# Patient Record
Sex: Female | Born: 1940 | Race: White | Hispanic: No | Smoking: Never smoker
Health system: Southern US, Academic
[De-identification: ages and names within clinical notes are randomized; demographics above are authoritative.]

## PROBLEM LIST (undated history)

## (undated) ENCOUNTER — Ambulatory Visit (HOSPITAL_COMMUNITY): Admission: RE | Payer: Self-pay | Admitting: Otolaryngology

## (undated) ENCOUNTER — Encounter (HOSPITAL_COMMUNITY): Admission: RE | Payer: Self-pay

## (undated) DIAGNOSIS — C50919 Malignant neoplasm of unspecified site of unspecified female breast: Secondary | ICD-10-CM

## (undated) DIAGNOSIS — N200 Calculus of kidney: Secondary | ICD-10-CM

## (undated) DIAGNOSIS — F329 Major depressive disorder, single episode, unspecified: Secondary | ICD-10-CM

## (undated) DIAGNOSIS — R7303 Prediabetes: Secondary | ICD-10-CM

## (undated) DIAGNOSIS — H68103 Unspecified obstruction of Eustachian tube, bilateral: Secondary | ICD-10-CM

## (undated) DIAGNOSIS — K219 Gastro-esophageal reflux disease without esophagitis: Secondary | ICD-10-CM

## (undated) DIAGNOSIS — N3010 Interstitial cystitis (chronic) without hematuria: Secondary | ICD-10-CM

## (undated) DIAGNOSIS — R002 Palpitations: Secondary | ICD-10-CM

## (undated) DIAGNOSIS — R0600 Dyspnea, unspecified: Secondary | ICD-10-CM

## (undated) DIAGNOSIS — M797 Fibromyalgia: Secondary | ICD-10-CM

## (undated) DIAGNOSIS — E785 Hyperlipidemia, unspecified: Secondary | ICD-10-CM

## (undated) DIAGNOSIS — A419 Sepsis, unspecified organism: Secondary | ICD-10-CM

## (undated) DIAGNOSIS — I10 Essential (primary) hypertension: Secondary | ICD-10-CM

## (undated) DIAGNOSIS — K589 Irritable bowel syndrome without diarrhea: Secondary | ICD-10-CM

## (undated) DIAGNOSIS — R079 Chest pain, unspecified: Secondary | ICD-10-CM

## (undated) DIAGNOSIS — R0609 Other forms of dyspnea: Secondary | ICD-10-CM

## (undated) DIAGNOSIS — F419 Anxiety disorder, unspecified: Secondary | ICD-10-CM

## (undated) DIAGNOSIS — Z87442 Personal history of urinary calculi: Secondary | ICD-10-CM

## (undated) DIAGNOSIS — I519 Heart disease, unspecified: Secondary | ICD-10-CM

## (undated) DIAGNOSIS — R233 Spontaneous ecchymoses: Secondary | ICD-10-CM

## (undated) DIAGNOSIS — C801 Malignant (primary) neoplasm, unspecified: Secondary | ICD-10-CM

## (undated) DIAGNOSIS — Z87448 Personal history of other diseases of urinary system: Secondary | ICD-10-CM

## (undated) DIAGNOSIS — G459 Transient cerebral ischemic attack, unspecified: Secondary | ICD-10-CM

## (undated) DIAGNOSIS — H9190 Unspecified hearing loss, unspecified ear: Secondary | ICD-10-CM

## (undated) DIAGNOSIS — I639 Cerebral infarction, unspecified: Secondary | ICD-10-CM

## (undated) HISTORY — PX: HX VENTILATORY TUBES: 2100001159

## (undated) HISTORY — DX: Malignant (primary) neoplasm, unspecified (CMS HCC): C80.1

## (undated) HISTORY — PX: HX TONSILLECTOMY: SHX27

## (undated) HISTORY — PX: MASTECTOMY, PARTIAL: SHX709

## (undated) HISTORY — PX: KIDNEY SURGERY: SHX687

## (undated) HISTORY — DX: Heart disease, unspecified: I51.9

## (undated) HISTORY — PX: COLONOSCOPY: SHX174

## (undated) HISTORY — DX: Cerebral infarction, unspecified (CMS HCC): I63.9

## (undated) HISTORY — PX: CATARACT EXTRACTION, BILATERAL: SHX1313

## (undated) HISTORY — DX: Unspecified hearing loss, unspecified ear: H91.90

## (undated) HISTORY — PX: HX OTHER: 2100001105

## (undated) HISTORY — DX: Fibromyalgia: M79.7

## (undated) HISTORY — PX: EYE SURGERY: SHX253

## (undated) HISTORY — DX: Calculus of kidney: N20.0

## (undated) HISTORY — DX: Anxiety disorder, unspecified: F41.9

## (undated) HISTORY — PX: NEPHROSTOMY: SHX1014

## (undated) HISTORY — DX: Palpitations: R00.2

## (undated) HISTORY — PX: TONSILLECTOMY: SUR1361

## (undated) HISTORY — PX: NECK SURGERY: SHX720

## (undated) HISTORY — PX: MASTECTOMY: SHX3

## (undated) HISTORY — DX: Dyspnea, unspecified: R06.00

## (undated) HISTORY — DX: Hyperlipidemia, unspecified: E78.5

## (undated) HISTORY — DX: Chest pain, unspecified: R07.9

## (undated) HISTORY — DX: Other forms of dyspnea: R06.09

## (undated) SURGERY — IMPLANT EAR BAHA CONNECT
Anesthesia: General | Laterality: Left

---
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
## 1996-09-04 ENCOUNTER — Ambulatory Visit (HOSPITAL_COMMUNITY): Payer: Self-pay | Admitting: OBSTETRICS/GYNECOLOGY

## 2005-04-03 DIAGNOSIS — R197 Diarrhea, unspecified: Secondary | ICD-10-CM | POA: Insufficient documentation

## 2007-07-11 DIAGNOSIS — N951 Menopausal and female climacteric states: Secondary | ICD-10-CM | POA: Insufficient documentation

## 2007-07-11 DIAGNOSIS — F411 Generalized anxiety disorder: Secondary | ICD-10-CM

## 2007-07-11 HISTORY — DX: Generalized anxiety disorder: F41.1

## 2008-02-06 DIAGNOSIS — G4720 Circadian rhythm sleep disorder, unspecified type: Secondary | ICD-10-CM | POA: Insufficient documentation

## 2008-02-10 ENCOUNTER — Encounter: Admission: RE | Admit: 2008-02-10 | Discharge: 2008-02-10 | Payer: Self-pay | Admitting: Family Medicine

## 2008-02-10 DIAGNOSIS — R920 Mammographic microcalcification found on diagnostic imaging of breast: Secondary | ICD-10-CM | POA: Insufficient documentation

## 2010-02-07 DIAGNOSIS — K319 Disease of stomach and duodenum, unspecified: Secondary | ICD-10-CM | POA: Insufficient documentation

## 2010-02-07 DIAGNOSIS — I10 Essential (primary) hypertension: Secondary | ICD-10-CM

## 2010-02-07 HISTORY — DX: Essential (primary) hypertension: I10

## 2010-04-16 ENCOUNTER — Emergency Department (HOSPITAL_BASED_OUTPATIENT_CLINIC_OR_DEPARTMENT_OTHER): Admission: EM | Admit: 2010-04-16 | Discharge: 2010-04-16 | Payer: Self-pay | Admitting: Emergency Medicine

## 2011-05-31 DIAGNOSIS — N3010 Interstitial cystitis (chronic) without hematuria: Secondary | ICD-10-CM | POA: Insufficient documentation

## 2011-05-31 DIAGNOSIS — N3020 Other chronic cystitis without hematuria: Secondary | ICD-10-CM | POA: Insufficient documentation

## 2011-09-18 DIAGNOSIS — Z8719 Personal history of other diseases of the digestive system: Secondary | ICD-10-CM | POA: Insufficient documentation

## 2011-09-18 HISTORY — DX: Personal history of other diseases of the digestive system: Z87.19

## 2012-05-17 ENCOUNTER — Inpatient Hospital Stay (HOSPITAL_BASED_OUTPATIENT_CLINIC_OR_DEPARTMENT_OTHER)
Admission: EM | Admit: 2012-05-17 | Discharge: 2012-05-23 | DRG: 853 | Disposition: A | Discharge status: Skilled Nursing Facility | Payer: PRIVATE HEALTH INSURANCE | Attending: Internal Medicine | Admitting: Internal Medicine

## 2012-05-17 ENCOUNTER — Encounter (HOSPITAL_BASED_OUTPATIENT_CLINIC_OR_DEPARTMENT_OTHER): Payer: Self-pay | Admitting: *Deleted

## 2012-05-17 ENCOUNTER — Emergency Department (HOSPITAL_BASED_OUTPATIENT_CLINIC_OR_DEPARTMENT_OTHER): Payer: PRIVATE HEALTH INSURANCE

## 2012-05-17 DIAGNOSIS — N179 Acute kidney failure, unspecified: Secondary | ICD-10-CM

## 2012-05-17 DIAGNOSIS — N132 Hydronephrosis with renal and ureteral calculous obstruction: Secondary | ICD-10-CM

## 2012-05-17 DIAGNOSIS — B964 Proteus (mirabilis) (morganii) as the cause of diseases classified elsewhere: Secondary | ICD-10-CM | POA: Diagnosis present

## 2012-05-17 DIAGNOSIS — N390 Urinary tract infection, site not specified: Secondary | ICD-10-CM

## 2012-05-17 DIAGNOSIS — I5032 Chronic diastolic (congestive) heart failure: Secondary | ICD-10-CM | POA: Diagnosis present

## 2012-05-17 DIAGNOSIS — N1330 Unspecified hydronephrosis: Secondary | ICD-10-CM | POA: Diagnosis present

## 2012-05-17 DIAGNOSIS — D649 Anemia, unspecified: Secondary | ICD-10-CM

## 2012-05-17 DIAGNOSIS — R7881 Bacteremia: Secondary | ICD-10-CM

## 2012-05-17 DIAGNOSIS — I2489 Other forms of acute ischemic heart disease: Secondary | ICD-10-CM | POA: Diagnosis present

## 2012-05-17 DIAGNOSIS — D72829 Elevated white blood cell count, unspecified: Secondary | ICD-10-CM

## 2012-05-17 DIAGNOSIS — D509 Iron deficiency anemia, unspecified: Secondary | ICD-10-CM | POA: Diagnosis present

## 2012-05-17 DIAGNOSIS — R7401 Elevation of levels of liver transaminase levels: Secondary | ICD-10-CM | POA: Diagnosis present

## 2012-05-17 DIAGNOSIS — N12 Tubulo-interstitial nephritis, not specified as acute or chronic: Secondary | ICD-10-CM | POA: Diagnosis present

## 2012-05-17 DIAGNOSIS — R6520 Severe sepsis without septic shock: Secondary | ICD-10-CM | POA: Diagnosis present

## 2012-05-17 DIAGNOSIS — N201 Calculus of ureter: Secondary | ICD-10-CM | POA: Diagnosis present

## 2012-05-17 DIAGNOSIS — I248 Other forms of acute ischemic heart disease: Secondary | ICD-10-CM | POA: Diagnosis present

## 2012-05-17 DIAGNOSIS — E871 Hypo-osmolality and hyponatremia: Secondary | ICD-10-CM | POA: Diagnosis present

## 2012-05-17 DIAGNOSIS — A419 Sepsis, unspecified organism: Principal | ICD-10-CM

## 2012-05-17 DIAGNOSIS — J9600 Acute respiratory failure, unspecified whether with hypoxia or hypercapnia: Secondary | ICD-10-CM | POA: Diagnosis present

## 2012-05-17 DIAGNOSIS — N189 Chronic kidney disease, unspecified: Secondary | ICD-10-CM

## 2012-05-17 DIAGNOSIS — N3010 Interstitial cystitis (chronic) without hematuria: Secondary | ICD-10-CM | POA: Diagnosis present

## 2012-05-17 DIAGNOSIS — I509 Heart failure, unspecified: Secondary | ICD-10-CM | POA: Diagnosis present

## 2012-05-17 DIAGNOSIS — G9340 Encephalopathy, unspecified: Secondary | ICD-10-CM | POA: Diagnosis present

## 2012-05-17 DIAGNOSIS — E876 Hypokalemia: Secondary | ICD-10-CM

## 2012-05-17 DIAGNOSIS — J9601 Acute respiratory failure with hypoxia: Secondary | ICD-10-CM | POA: Diagnosis present

## 2012-05-17 HISTORY — DX: Acute kidney failure, unspecified: N18.9

## 2012-05-17 HISTORY — DX: Hypokalemia: E87.6

## 2012-05-17 HISTORY — DX: Gastro-esophageal reflux disease without esophagitis: K21.9

## 2012-05-17 HISTORY — DX: Elevated white blood cell count, unspecified: D72.829

## 2012-05-17 HISTORY — DX: Interstitial cystitis (chronic) without hematuria: N30.10

## 2012-05-17 HISTORY — DX: Iron deficiency anemia, unspecified: D50.9

## 2012-05-17 HISTORY — DX: Malignant neoplasm of unspecified site of unspecified female breast: C50.919

## 2012-05-17 HISTORY — DX: Essential (primary) hypertension: I10

## 2012-05-17 HISTORY — DX: Chronic kidney disease, unspecified: N17.9

## 2012-05-17 LAB — COMPREHENSIVE METABOLIC PANEL
ALT: 45 U/L — ABNORMAL HIGH (ref 0–35)
AST: 46 U/L — ABNORMAL HIGH (ref 0–37)
CO2: 22 mEq/L (ref 19–32)
Chloride: 99 mEq/L (ref 96–112)
Creatinine, Ser: 1.4 mg/dL — ABNORMAL HIGH (ref 0.50–1.10)
GFR calc non Af Amer: 37 mL/min — ABNORMAL LOW (ref >90)
Glucose, Bld: 125 mg/dL — ABNORMAL HIGH (ref 70–99)
Sodium: 134 mEq/L — ABNORMAL LOW (ref 135–145)
Total Bilirubin: 0.8 mg/dL (ref 0.3–1.2)

## 2012-05-17 LAB — CBC WITH DIFFERENTIAL/PLATELET
Basophils Absolute: 0 10*3/uL (ref 0.0–0.1)
Lymphocytes Relative: 3 % — ABNORMAL LOW (ref 12–46)
Lymphs Abs: 0.4 10*3/uL — ABNORMAL LOW (ref 0.7–4.0)
MCH: 25.5 pg — ABNORMAL LOW (ref 26.0–34.0)
MCHC: 33.7 g/dL (ref 30.0–36.0)
MCV: 75.5 fL — ABNORMAL LOW (ref 78.0–100.0)
Monocytes Absolute: 0.6 10*3/uL (ref 0.1–1.0)
Neutro Abs: 13.3 10*3/uL — ABNORMAL HIGH (ref 1.7–7.7)

## 2012-05-17 LAB — URINALYSIS, ROUTINE W REFLEX MICROSCOPIC
Bilirubin Urine: NEGATIVE
Glucose, UA: NEGATIVE mg/dL
Ketones, ur: 15 mg/dL — AB
Protein, ur: 100 mg/dL — AB

## 2012-05-17 LAB — TROPONIN I: Troponin I: <0.3 ng/mL (ref <0.30)

## 2012-05-17 LAB — URINE MICROSCOPIC-ADD ON

## 2012-05-17 MED ORDER — DIAZEPAM 5 MG PO TABS: 5.0000 mg | ORAL_TABLET | Freq: Two times a day (BID) | ORAL | Status: DC

## 2012-05-17 MED ORDER — SODIUM CHLORIDE 0.9 % IV BOLUS (SEPSIS)
1000.0000 mL | Freq: Once | INTRAVENOUS | Status: AC
  Administered 2012-05-17: 1000 mL via INTRAVENOUS

## 2012-05-17 MED ORDER — SODIUM CHLORIDE 0.9 % IV SOLN: INTRAVENOUS | Status: DC

## 2012-05-17 MED ORDER — HYDROCODONE-ACETAMINOPHEN 5-325 MG PO TABS: 1.0000 | ORAL_TABLET | ORAL | Status: DC | PRN

## 2012-05-17 MED ORDER — METOPROLOL TARTRATE 1 MG/ML IV SOLN
INTRAVENOUS | Status: AC
  Filled 2012-05-17: qty 5

## 2012-05-17 MED ORDER — AMLODIPINE BESYLATE 5 MG PO TABS: 7.5000 mg | ORAL_TABLET | Freq: Every day | ORAL | Status: DC

## 2012-05-17 MED ORDER — ACETAMINOPHEN 500 MG PO TABS
ORAL_TABLET | ORAL | Status: AC
  Administered 2012-05-17: 1000 mg via ORAL
  Filled 2012-05-17: qty 2

## 2012-05-17 MED ORDER — ONDANSETRON HCL 4 MG/2ML IJ SOLN
4.0000 mg | Freq: Once | INTRAMUSCULAR | Status: AC
  Administered 2012-05-17: 4 mg via INTRAVENOUS

## 2012-05-17 MED ORDER — MORPHINE SULFATE 2 MG/ML IJ SOLN
1.0000 mg | INTRAMUSCULAR | Status: DC | PRN
  Administered 2012-05-17 – 2012-05-20 (×3): 1 mg via INTRAVENOUS
  Filled 2012-05-17 (×2): qty 1

## 2012-05-17 MED ORDER — DIAZEPAM 5 MG PO TABS: 5.0000 mg | ORAL_TABLET | Freq: Two times a day (BID) | ORAL | Status: DC | PRN

## 2012-05-17 MED ORDER — POTASSIUM CHLORIDE CRYS ER 20 MEQ PO TBCR
40.0000 meq | EXTENDED_RELEASE_TABLET | Freq: Once | ORAL | Status: AC
  Administered 2012-05-17: 40 meq via ORAL
  Filled 2012-05-17: qty 2

## 2012-05-17 MED ORDER — PIPERACILLIN-TAZOBACTAM 3.375 G IVPB 30 MIN: 3.3750 g | Freq: Two times a day (BID) | INTRAVENOUS | Status: DC

## 2012-05-17 MED ORDER — SODIUM CHLORIDE 0.9 % IV SOLN
INTRAVENOUS | Status: AC
  Administered 2012-05-18: 100 mL/h via INTRAVENOUS

## 2012-05-17 MED ORDER — SODIUM CHLORIDE 0.9 % IV SOLN
1000.0000 mL | Freq: Once | INTRAVENOUS | Status: AC
  Administered 2012-05-17: 1000 mL via INTRAVENOUS

## 2012-05-17 MED ORDER — PIPERACILLIN-TAZOBACTAM 3.375 G IVPB
3.3750 g | Freq: Three times a day (TID) | INTRAVENOUS | Status: DC
  Administered 2012-05-18 – 2012-05-20 (×7): 3.375 g via INTRAVENOUS
  Filled 2012-05-17 (×9): qty 50

## 2012-05-17 MED ORDER — SODIUM CHLORIDE 0.9 % IV BOLUS (SEPSIS): 1000.0000 mL | Freq: Once | INTRAVENOUS | Status: DC

## 2012-05-17 MED ORDER — MORPHINE SULFATE 2 MG/ML IJ SOLN
INTRAMUSCULAR | Status: AC
  Filled 2012-05-17: qty 1

## 2012-05-17 MED ORDER — ONDANSETRON HCL 4 MG/2ML IJ SOLN
INTRAMUSCULAR | Status: AC
  Administered 2012-05-17: 4 mg via INTRAVENOUS
  Filled 2012-05-17: qty 2

## 2012-05-17 MED ORDER — ACETAMINOPHEN 500 MG PO TABS
1000.0000 mg | ORAL_TABLET | Freq: Once | ORAL | Status: AC
  Administered 2012-05-17: 1000 mg via ORAL

## 2012-05-17 MED ORDER — ONDANSETRON HCL 4 MG/2ML IJ SOLN
4.0000 mg | Freq: Four times a day (QID) | INTRAMUSCULAR | Status: DC | PRN
  Administered 2012-05-19: 4 mg via INTRAVENOUS
  Filled 2012-05-17: qty 2

## 2012-05-17 MED ORDER — SODIUM CHLORIDE 0.9 % IV SOLN
1000.0000 mL | INTRAVENOUS | Status: DC
  Administered 2012-05-17 (×2): 1000 mL via INTRAVENOUS

## 2012-05-17 MED ORDER — ONDANSETRON HCL 4 MG PO TABS
4.0000 mg | ORAL_TABLET | Freq: Four times a day (QID) | ORAL | Status: DC | PRN
  Administered 2012-05-20 – 2012-05-22 (×2): 4 mg via ORAL
  Filled 2012-05-17 (×2): qty 1

## 2012-05-17 MED ORDER — PENTOSAN POLYSULFATE SODIUM 100 MG PO CAPS
100.0000 mg | ORAL_CAPSULE | Freq: Two times a day (BID) | ORAL | Status: DC
  Administered 2012-05-18 – 2012-05-23 (×11): 100 mg via ORAL
  Filled 2012-05-17 (×14): qty 1

## 2012-05-17 MED ORDER — PIPERACILLIN-TAZOBACTAM 3.375 G IVPB 30 MIN
3.3750 g | Freq: Once | INTRAVENOUS | Status: AC
  Administered 2012-05-17: 3.375 g via INTRAVENOUS
  Filled 2012-05-17 (×2): qty 50

## 2012-05-17 MED ORDER — METOPROLOL TARTRATE 1 MG/ML IV SOLN
5.0000 mg | Freq: Three times a day (TID) | INTRAVENOUS | Status: DC | PRN
  Administered 2012-05-17: 5 mg via INTRAVENOUS

## 2012-05-17 MED ORDER — ACETAMINOPHEN 650 MG RE SUPP
650.0000 mg | Freq: Four times a day (QID) | RECTAL | Status: DC | PRN
  Administered 2012-05-17 – 2012-05-18 (×2): 650 mg via RECTAL
  Filled 2012-05-17 (×2): qty 1

## 2012-05-17 MED ORDER — PANTOPRAZOLE SODIUM 40 MG PO TBEC
40.0000 mg | DELAYED_RELEASE_TABLET | Freq: Every day | ORAL | Status: DC
  Administered 2012-05-18 – 2012-05-23 (×6): 40 mg via ORAL
  Filled 2012-05-17 (×6): qty 1

## 2012-05-17 MED ORDER — ENOXAPARIN SODIUM 30 MG/0.3ML ~~LOC~~ SOLN: 30.0000 mg | SUBCUTANEOUS | Status: DC

## 2012-05-17 NOTE — ED Notes (Signed)
Via carelink--spoke with [employer]

## 2012-05-17 NOTE — ED Notes (Signed)
Pt to room 2 by ems via stretcher. Pt reports n/v/d and fevers x Tuesday. Pt is awake, alert, states she took tylenol "sometime today" but she does not recall when. Pt lives alone, her friend lives next door and checks on her frequently per ems.

## 2012-05-17 NOTE — Progress Notes (Signed)
ANTIBIOTIC CONSULT NOTE - INITIAL  Pharmacy Consult for Zosyn Indication: UTI, r/o sepsis  No Known Allergies  Patient Measurements:     Vital Signs: Temp: 98.1 F (36.7 C) (12/06 1949) Temp src: Oral (12/06 1806) BP: 94/51 mmHg (12/06 1949) Pulse Rate: 89  (12/06 1949) Intake/Output from previous day:   Intake/Output from this shift: Total I/O In: -  Out: 500 [Urine:500]  Labs:  [facility] 05/17/12 1514  WBC 14.3*  HGB 10.9*  PLT 246  LABCREA --  CREATININE 1.40*   CrCl is unknown because there is no height on file for the current visit. No results found for this basename: VANCOTROUGH:2,VANCOPEAK:2,VANCORANDOM:2,GENTTROUGH:2,GENTPEAK:2,GENTRANDOM:2,TOBRATROUGH:2,TOBRAPEAK:2,TOBRARND:2,AMIKACINPEAK:2,AMIKACINTROU:2,AMIKACIN:2, in the last 72 hours   Microbiology: No results found for this or any previous visit (from the past 720 hour(s)).  Medical History: Past Medical History  Diagnosis Date  . Breast cancer   . Interstitial cystitis   . Gastric reflux   . Hypertension     Assessment: 71 y.o. F who presented to [facility] on 12/6 with rigors, fevers, and AMS and was transferred to [facility] stepdown unit. At [facility], the patient received a dose of Zosyn around 1800. Pharmacy was consulted to continue Zosyn dosing for empiric UTI, r/o sepsis coverage. Urine and blood cultures pending. SCr 1.4, estimated CrCl~40 ml/min  Goal of Therapy:  Proper antibiotics for infection/cultures adjusted for renal/hepatic function   Plan:  1. Zosyn 3.375g IV every 8 hours (next dose due at 0200) 2. Will continue to follow renal function, culture results, LOT, and antibiotic de-escalation plans   Georgina Pillion, PharmD, BCPS Clinical Pharmacist Pager: [phone] 05/17/2012 9:43 PM

## 2012-05-17 NOTE — ED Notes (Signed)
Pt transferred via carelink. 

## 2012-05-17 NOTE — ED Provider Notes (Signed)
History     CSN: [id number]  Arrival date & time 05/17/12  1504   First MD Initiated Contact with Patient 05/17/12 1507      Chief Complaint  Patient presents with  . Fever  . Emesis    (Consider location/radiation/quality/duration/timing/severity/associated sxs/prior treatment) The history is provided by the patient.  Rachael Greer is a 71 y.o. female hx of interstitial cystitis, HTN here with fever. Fever for 3 days, subjective. Also chills at home. She has intermittent nausea and vomiting for the last few days. Her friend noted that she is more tired today so she brought her to the ED. She has been having nonproductive cough. Denies urinary symptoms or diarrhea. She never had a flu shot since had an adverse reaction to typhoid immunization in the past.    Past Medical History  Diagnosis Date  . Breast cancer   . Interstitial cystitis   . Gastric reflux   . Hypertension     History reviewed. No pertinent past surgical history.  History reviewed. No pertinent family history.  History  Substance Use Topics  . Smoking status: Never Smoker   . Smokeless tobacco: Not on file  . Alcohol Use:     OB History    Grav Para Term Preterm Abortions TAB SAB Ect Mult Living                  Review of Systems  Constitutional: Positive for fever, chills and fatigue.  Respiratory: Positive for cough.   Gastrointestinal: Positive for nausea and vomiting.  All other systems reviewed and are negative.    Allergies  Review of patient's allergies indicates no known allergies.  Home Medications   Current Outpatient Rx  Name  Route  Sig  Dispense  Refill  . AMLODIPINE BESYLATE 5 MG PO TABS   Oral   Take 7.5 mg by mouth daily.         Marland Kitchen DIAZEPAM 5 MG PO TABS   Oral   Take 5 mg by mouth 2 (two) times daily.         Marland Kitchen NITROFURANTOIN MACROCRYSTAL 50 MG PO CAPS   Oral   Take 50 mg by mouth at bedtime.         . OMEPRAZOLE 40 MG PO CPDR   Oral   Take 40 mg by mouth  daily.         Marland Kitchen PENTOSAN POLYSULFATE SODIUM 100 MG PO CAPS   Oral   Take 100 mg by mouth 2 (two) times daily.           BP 97/54  Pulse 93  Temp 99.2 F (37.3 C) (Oral)  Resp 18  SpO2 95%  Physical Exam  Nursing note and vitals reviewed. Constitutional: She is oriented to person, place, and time.       Tired, fatigue, unable to answer many questions   HENT:  Head: Normocephalic.  Mouth/Throat: Oropharynx is clear and moist.  Eyes: Conjunctivae normal are normal. Pupils are equal, round, and reactive to light.  Neck: Normal range of motion. Neck supple.  Cardiovascular: Regular rhythm and normal heart sounds.        Tachycardic   Pulmonary/Chest: Effort normal.       Crackles on R base   Abdominal: Soft. Bowel sounds are normal. She exhibits no distension. There is no tenderness. There is no rebound and no guarding.  Musculoskeletal: Normal range of motion. She exhibits no edema and no tenderness.  Neurological: She is alert  and oriented to person, place, and time.  Skin: Skin is warm and dry.  Psychiatric: She has a normal mood and affect. Her behavior is normal. Judgment and thought content normal.    ED Course  Procedures (including critical care time)  CRITICAL CARE Performed by: Silverio Lay, Camia Dipinto   Total critical care time: 30 min  Critical care time was exclusive of separately billable procedures and treating other patients.  Critical care was necessary to treat or prevent imminent or life-threatening deterioration.  Critical care was time spent personally by me on the following activities: development of treatment plan with patient and/or surrogate as well as nursing, discussions with consultants, evaluation of patient's response to treatment, examination of patient, obtaining history from patient or surrogate, ordering and performing treatments and interventions, ordering and review of laboratory studies, ordering and review of radiographic studies, pulse oximetry  and re-evaluation of patient's condition.   Labs Reviewed  CBC WITH DIFFERENTIAL - Abnormal; Notable for the following:    WBC 14.3 (*)     Hemoglobin 10.9 (*)     HCT 32.3 (*)     MCV 75.5 (*)     MCH 25.5 (*)     Neutrophils Relative 93 (*)     Neutro Abs 13.3 (*)     Lymphocytes Relative 3 (*)     Lymphs Abs 0.4 (*)     All other components within normal limits  COMPREHENSIVE METABOLIC PANEL - Abnormal; Notable for the following:    Sodium 134 (*)     Potassium 3.2 (*)     Glucose, Bld 125 (*)     Creatinine, Ser 1.40 (*)     Albumin 3.0 (*)     AST 46 (*)     ALT 45 (*)     GFR calc non Af Amer 37 (*)     GFR calc Af Amer 43 (*)     All other components within normal limits  URINALYSIS, ROUTINE W REFLEX MICROSCOPIC - Abnormal; Notable for the following:    Color, Urine AMBER (*)  BIOCHEMICALS MAY BE AFFECTED BY COLOR   APPearance CLOUDY (*)     Hgb urine dipstick LARGE (*)     Ketones, ur 15 (*)     Protein, ur 100 (*)     Nitrite POSITIVE (*)     Leukocytes, UA LARGE (*)     All other components within normal limits  CK TOTAL AND CKMB - Abnormal; Notable for the following:    Total CK 201 (*)     All other components within normal limits  URINE MICROSCOPIC-ADD ON - Abnormal; Notable for the following:    Bacteria, UA MANY (*)     All other components within normal limits  LACTIC ACID, PLASMA  TROPONIN I  CULTURE, BLOOD (ROUTINE X 2)  CULTURE, BLOOD (ROUTINE X 2)  URINE CULTURE  URINE CULTURE   Dg Chest 2 View  05/17/2012  *RADIOLOGY REPORT*  Clinical Data: Fever, vomiting  CHEST - 2 VIEW  Comparison: None.  Findings: Cardiomediastinal silhouette is unremarkable.  No acute infiltrate or pleural effusion.  No pulmonary edema.  Bony thorax is unremarkable.  IMPRESSION: No active disease.   Original Report Authenticated By: Natasha Mead, M.D.      No diagnosis found.   Date: 05/17/2012  Rate: 119  Rhythm: sinus tachycardia  QRS Axis: normal  Intervals:  normal  ST/T Wave abnormalities: normal  Conduction Disutrbances:right bundle branch block  Narrative Interpretation: Changed from 04/16/10  Old EKG Reviewed: changes noted    MDM  Rachael Greer is a 71 y.o. female here with fever, fatigue, tachycardia. She is also hypotensive 94/50 initially and likely has severe sepsis. Will get labs, cultures, CXR, UA and reassess.   6:38 PM Labs showed WBC 14, UA + UTI, CXR nl. Her BP went up to 100/50 after 1.5 L of fluids. She was given zosyn for broad spectrum coverage for urosepsis and hypotension. I discussed with Dr. Jomarie Longs from [facility], who accepted the patient to stepdown at [facility]. Patient's PMD at [facility] but she prefers to go to [facility].        Richardean Canal, MD 05/17/12 [phone]

## 2012-05-17 NOTE — Progress Notes (Signed)
PCP: Unassigned in [place] 71/F with h/o recurrent UTI, presented to [facility] with fevers, rigors and metabolic encephalopathy Temp 103, BP 94/54 improved to SBP 100 with 1.5L IVF UA with florid UTI Lactic acid normal Accepted to SDU with UTI/Sepsis  Zannie Cove, MD [phone]

## 2012-05-17 NOTE — Progress Notes (Addendum)
Since admission at 2100, pt's temp has spiked from 99.1 to 106.3; Dr. Izola Price has been to see patient prior to the temp. Spike and orders were received; Daphane Shepherd was spoken with regarding spike in temp.  Rectal Tylenol ordered and given and cooling blanket ordered if no success with Tylenol.  Pt. Has also went from RA to 5 L O2 since admission, with 02 saturation at 94 now with 5 L O2.  Prior, patient was 93% on RA.  Will continue to monitor closely.  Vivi Martens RN   ** This note charted by Vivi Martens RN, but under Maralyn Sago Mackay Hanauer's log in.

## 2012-05-17 NOTE — H&P (Addendum)
[facility] History and Physical  Robynne Roat WJX:[id number] DOB: Feb 26, 1941 DOA: 05/17/2012  Referring physician: ED physician PCP: No primary provider on file.   Chief Complaint: Fever, fatigue  HPI:  Rachael Greer is 71 yo female who was transferred from [facility] for further evaluation of 3 days long fever, Tmax 102F, associated with chills, generalized fatigue, nausea, non bloody vomiting, poor oral intake. 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 She reports similar episodes in the past when she was diagnosed with UTI but it was never as intense. Rachael Greer is unable to provide detailed history as she is tired and not able to stay alert long enough during this encounter.  ED EVENTS: At [facility] Rachael Greer febrile with Tmax 102 F, UA with large leukocytes and nitrites, WBC and many bacteria, also noted to have mild leukocytosis and acute on chronic renal failure. Transfer to [facility] required for treatment of potential urosepsis.  Assessment and Plan:  Encephalopathy - likely secondary to urosepsis and dehydration - agree with admission to stepdown and providing supportive care - will continue IVF, broad spectrum ABX (Zosyn) - will also obtain Rachael Greer/OT once Rachael Greer able to participate  Urosepsis - fever, tachycardia, hypotension and UA as noted above - findings consistent with urosepsis - will treat with Zosyn for now and follow up on urine culture - supportive care as noted above Active Problems:  Hypokalemia - likely secondary to vomiting - one dose of Kdur 40 MEQ given in [facility] - CMET in AM  Acute on chronic renal failure - it is unclear what the baseline creatinine is but there is a component of pre renal etiology secondary to - will provide hydration as noted above, strict I's and O's  Transaminitis - likely secondary to hypotensive  shock in the setting of urosepsis - will likely improve with supportive care noted above - CMET in AM  Leukocytosis - secondary to urosepsis - Zosyn as noted above  Microcytic anemia - unclear what the baseline Hg is - will obtain anemia panel - CBC in AM  Hyponatremia - secondary to dehydration, pre renal etiology - IVF   Code Status: Full Family Communication: Rachael Greer at bedside Disposition Plan: Monitor in stepdown unit   Review of Systems:  Unable to obtain form Rachael Greer as Rachael Greer is too tired, somnolent    Past Medical History  Diagnosis Date  . Breast cancer   . Interstitial cystitis   . Gastric reflux   . Hypertension     History reviewed. No pertinent past surgical history.  Social History:  reports that she has never smoked. She does not have any smokeless tobacco history on file. Her alcohol and drug histories not on file.  No Known Allergies  Rachael Greer unable to provide details on past family medical history   Medication Sig  amLODipine (NORVASC) 5 MG tablet Take 7.5 mg by mouth daily.  diazepam (VALIUM) 5 MG tablet Take 5 mg by mouth 2 (two) times daily.  nitrofurantoin (MACRODANTIN) 50 MG capsule Take 50 mg by mouth at bedtime.  omeprazole (PRILOSEC) 40 MG capsule Take 40 mg by mouth daily.  pentosan polysulfate (ELMIRON) 100 MG capsule Take 100 mg by mouth 2 (two) times daily.    Physical Exam: Filed Vitals:   05/17/12 1619 05/17/12 1642 05/17/12 1806 05/17/12 1949  BP: 94/54 100/53 97/54 94/51   Pulse: 103 103 93  89  Temp:  99.7 F (37.6 C) 99.2 F (37.3 C) 98.1 F (36.7 C)  TempSrc:  Oral Oral   Resp: 20 18 18 23   SpO2: 94% 94% 95% 100%    Physical Exam  Constitutional: Appears somnolent but not in acute distress.  HENT: Normocephalic. External right and left ear normal. Dry MM Eyes: Conjunctivae normal. PERRLA, no scleral icterus.  Neck: Normal ROM. Neck supple. No JVD. No tracheal deviation. No thyromegaly.  CVS: Regular rhythm, tachycardic, S1/S2 +, no  murmurs, no gallops, no carotid bruit.  Pulmonary: Effort and breath sounds normal, bibasilar crackles Abdominal: Soft. BS +,  no distension, tenderness in epigastric area, no rebound or guarding.  Musculoskeletal: Normal range of motion. No edema and no tenderness.  Lymphadenopathy: No lymphadenopathy noted, cervical, inguinal. Neuro:Somnolent but grossly non focal Skin: Skin is warm and dry. No rash noted. Not diaphoretic. No erythema. No pallor.  Psychiatric: Normal affect but Rachael Greer somnolent.  Labs on Admission:  Basic Metabolic Panel:  Lab 05/17/12 4098  NA 134*  K 3.2*  CL 99  CO2 22  GLUCOSE 125*  BUN 15  CREATININE 1.40*  CALCIUM 9.5  MG --  PHOS --   Liver Function Tests:  Lab 05/17/12 1514  AST 46*  ALT 45*  ALKPHOS 97  BILITOT 0.8  PROT 7.3  ALBUMIN 3.0*   CBC:  Lab 05/17/12 1514  WBC 14.3*  NEUTROABS 13.3*  HGB 10.9*  HCT 32.3*  MCV 75.5*  PLT 246   Cardiac Enzymes:  Lab 05/17/12 1514  CKTOTAL 201*  CKMB 1.2  CKMBINDEX --  TROPONINI <0.30   Radiological Exams on Admission: Dg Chest 2 View 05/17/2012   No active disease.   EKG: Normal sinus rhythm, no ST/T wave changes  Debbora Presto, MD  [facility] Pager [phone]  If 7PM-7AM, please contact night-coverage [email or web address] Password [facility] 05/17/2012, 9:13 PM

## 2012-05-18 ENCOUNTER — Inpatient Hospital Stay (HOSPITAL_COMMUNITY): Payer: PRIVATE HEALTH INSURANCE

## 2012-05-18 DIAGNOSIS — N390 Urinary tract infection, site not specified: Secondary | ICD-10-CM

## 2012-05-18 DIAGNOSIS — N179 Acute kidney failure, unspecified: Secondary | ICD-10-CM

## 2012-05-18 DIAGNOSIS — N189 Chronic kidney disease, unspecified: Secondary | ICD-10-CM

## 2012-05-18 DIAGNOSIS — A419 Sepsis, unspecified organism: Secondary | ICD-10-CM

## 2012-05-18 LAB — CBC
HCT: 24.1 % — ABNORMAL LOW (ref 36.0–46.0)
Hemoglobin: 7.6 g/dL — ABNORMAL LOW (ref 12.0–15.0)
Hemoglobin: 8 g/dL — ABNORMAL LOW (ref 12.0–15.0)
MCHC: 32.1 g/dL (ref 30.0–36.0)
Platelets: 151 10*3/uL (ref 150–400)
RBC: 3.13 MIL/uL — ABNORMAL LOW (ref 3.87–5.11)
RDW: 14.3 % (ref 11.5–15.5)
WBC: 12.2 10*3/uL — ABNORMAL HIGH (ref 4.0–10.5)

## 2012-05-18 LAB — COMPREHENSIVE METABOLIC PANEL
ALT: 48 U/L — ABNORMAL HIGH (ref 0–35)
Alkaline Phosphatase: 210 U/L — ABNORMAL HIGH (ref 39–117)
BUN: 15 mg/dL (ref 6–23)
CO2: 18 mEq/L — ABNORMAL LOW (ref 19–32)
Calcium: 7.4 mg/dL — ABNORMAL LOW (ref 8.4–10.5)
Chloride: 114 mEq/L — ABNORMAL HIGH (ref 96–112)
Creatinine, Ser: 1.42 mg/dL — ABNORMAL HIGH (ref 0.50–1.10)
GFR calc Af Amer: 42 mL/min — ABNORMAL LOW (ref >90)
GFR calc Af Amer: 41 mL/min — ABNORMAL LOW (ref >90)
GFR calc non Af Amer: 35 mL/min — ABNORMAL LOW (ref >90)
Glucose, Bld: 103 mg/dL — ABNORMAL HIGH (ref 70–99)
Glucose, Bld: 96 mg/dL (ref 70–99)
Potassium: 3 mEq/L — ABNORMAL LOW (ref 3.5–5.1)
Sodium: 139 mEq/L (ref 135–145)
Sodium: 141 mEq/L (ref 135–145)
Total Bilirubin: 0.7 mg/dL (ref 0.3–1.2)
Total Protein: 4.8 g/dL — ABNORMAL LOW (ref 6.0–8.3)
Total Protein: 5.1 g/dL — ABNORMAL LOW (ref 6.0–8.3)

## 2012-05-18 LAB — CK TOTAL AND CKMB (NOT AT ARMC)
CK, MB: 5.4 ng/mL — ABNORMAL HIGH (ref 0.3–4.0)
Total CK: 1690 U/L — ABNORMAL HIGH (ref 7–177)

## 2012-05-18 LAB — PROTIME-INR: INR: 1.28 (ref 0.00–1.49)

## 2012-05-18 LAB — LACTIC ACID, PLASMA
Lactic Acid, Venous: 1.2 mmol/L (ref 0.5–2.2)
Lactic Acid, Venous: 3.4 mmol/L — ABNORMAL HIGH (ref 0.5–2.2)

## 2012-05-18 LAB — MAGNESIUM: Magnesium: 1.5 mg/dL (ref 1.5–2.5)

## 2012-05-18 LAB — GLUCOSE, CAPILLARY
Glucose-Capillary: 126 mg/dL — ABNORMAL HIGH (ref 70–99)
Glucose-Capillary: 98 mg/dL (ref 70–99)

## 2012-05-18 LAB — MRSA PCR SCREENING: MRSA by PCR: NEGATIVE

## 2012-05-18 MED ORDER — IBUPROFEN 800 MG PO TABS
800.0000 mg | ORAL_TABLET | Freq: Once | ORAL | Status: AC
  Administered 2012-05-18: 800 mg via ORAL
  Filled 2012-05-18 (×2): qty 1

## 2012-05-18 MED ORDER — SODIUM CHLORIDE 0.9 % IV BOLUS (SEPSIS)
1000.0000 mL | Freq: Once | INTRAVENOUS | Status: AC
  Administered 2012-05-18: 1000 mL via INTRAVENOUS

## 2012-05-18 MED ORDER — FENTANYL CITRATE 0.05 MG/ML IJ SOLN
INTRAMUSCULAR | Status: AC | PRN
  Administered 2012-05-18 (×2): 12.5 ug via INTRAVENOUS

## 2012-05-18 MED ORDER — SODIUM CHLORIDE 0.9 % IV BOLUS (SEPSIS)
500.0000 mL | Freq: Once | INTRAVENOUS | Status: AC
  Administered 2012-05-18: 500 mL via INTRAVENOUS

## 2012-05-18 MED ORDER — ONDANSETRON HCL 4 MG/2ML IJ SOLN: 4.0000 mg | Freq: Four times a day (QID) | INTRAMUSCULAR | Status: DC | PRN

## 2012-05-18 MED ORDER — FUROSEMIDE 10 MG/ML IJ SOLN: 20.0000 mg | Freq: Once | INTRAMUSCULAR | Status: DC

## 2012-05-18 MED ORDER — ASPIRIN 81 MG PO CHEW
81.0000 mg | CHEWABLE_TABLET | Freq: Every day | ORAL | Status: DC
  Administered 2012-05-18 – 2012-05-23 (×6): 81 mg via ORAL
  Filled 2012-05-18 (×6): qty 1

## 2012-05-18 MED ORDER — MIDAZOLAM HCL 2 MG/2ML IJ SOLN
INTRAMUSCULAR | Status: AC
  Filled 2012-05-18: qty 4

## 2012-05-18 MED ORDER — VANCOMYCIN HCL IN DEXTROSE 1-5 GM/200ML-% IV SOLN
1000.0000 mg | INTRAVENOUS | Status: AC
  Administered 2012-05-18: 1000 mg via INTRAVENOUS
  Filled 2012-05-18: qty 200

## 2012-05-18 MED ORDER — IOHEXOL 300 MG/ML  SOLN
50.0000 mL | Freq: Once | INTRAMUSCULAR | Status: AC | PRN
  Administered 2012-05-18: 20 mL

## 2012-05-18 MED ORDER — VANCOMYCIN HCL IN DEXTROSE 1-5 GM/200ML-% IV SOLN: 1000.0000 mg | INTRAVENOUS | Status: DC

## 2012-05-18 MED ORDER — POTASSIUM CHLORIDE 10 MEQ/50ML IV SOLN
10.0000 meq | INTRAVENOUS | Status: AC
  Administered 2012-05-18 – 2012-05-19 (×4): 10 meq via INTRAVENOUS
  Filled 2012-05-18: qty 50
  Filled 2012-05-18: qty 150

## 2012-05-18 MED ORDER — HEPARIN SODIUM (PORCINE) 5000 UNIT/ML IJ SOLN
5000.0000 [IU] | Freq: Three times a day (TID) | INTRAMUSCULAR | Status: DC
  Administered 2012-05-19 – 2012-05-22 (×10): 5000 [IU] via SUBCUTANEOUS
  Filled 2012-05-18 (×18): qty 1

## 2012-05-18 MED ORDER — MEPERIDINE HCL 50 MG/ML IJ SOLN
INTRAMUSCULAR | Status: AC
  Filled 2012-05-18: qty 1

## 2012-05-18 MED ORDER — NOREPINEPHRINE BITARTRATE 1 MG/ML IJ SOLN
2.0000 ug/min | INTRAVENOUS | Status: DC
  Administered 2012-05-18: 4 ug/min via INTRAVENOUS
  Administered 2012-05-19: 5.013 ug/min via INTRAVENOUS
  Filled 2012-05-18 (×2): qty 4

## 2012-05-18 MED ORDER — PIPERACILLIN-TAZOBACTAM 3.375 G IVPB
3.3750 g | Freq: Once | INTRAVENOUS | Status: AC
  Administered 2012-05-18: 3.375 g via INTRAVENOUS
  Filled 2012-05-18: qty 50

## 2012-05-18 MED ORDER — SODIUM CHLORIDE 0.9 % IV SOLN
1000.0000 mL | Freq: Once | INTRAVENOUS | Status: AC
  Administered 2012-05-20: 1000 mL via INTRAVENOUS

## 2012-05-18 MED ORDER — FENTANYL CITRATE 0.05 MG/ML IJ SOLN
INTRAMUSCULAR | Status: AC
  Filled 2012-05-18: qty 4

## 2012-05-18 MED ORDER — MAGNESIUM SULFATE 40 MG/ML IJ SOLN
2.0000 g | Freq: Once | INTRAMUSCULAR | Status: AC
  Administered 2012-05-18: 2 g via INTRAVENOUS
  Filled 2012-05-18: qty 50

## 2012-05-18 NOTE — Progress Notes (Signed)
eLink Physician-Brief Progress Note Patient Name: Rachael Greer DOB: Jul 21, 1940 MRN: [id number]  Date of Service  05/18/2012   HPI/Events of Note   Hypokalemia and hypomagnesemia   eICU Interventions  K and Magnesium replaced      Oneill Bais 05/18/2012, 7:22 PM

## 2012-05-18 NOTE — Procedures (Signed)
Central Venous Catheter Insertion Procedure Note Rachael Greer [id number] 1940-10-11  Procedure: Insertion of Central Venous Catheter Indications: Assessment of intravascular volume, Drug and/or fluid administration and Frequent blood sampling  Procedure Details Consent: Risks of procedure as well as the alternatives and risks of each were explained to the (patient/caregiver).  Consent for procedure obtained. Time Out: Verified patient identification, verified procedure, site/side was marked, verified correct patient position, special equipment/implants available, medications/allergies/relevent history reviewed, required imaging and test results available.  Performed  Maximum sterile technique was used including antiseptics, cap, gloves, gown, hand hygiene, mask and sheet. Skin prep: Chlorhexidine; local anesthetic administered A triple lumen catheter was placed in the right internal jugular vein using the Seldinger technique.  Evaluation Blood flow good Complications: No apparent complications Patient did tolerate procedure well. Chest X-ray ordered to verify placement.  CXR: pending.   Procedure performed under direct supervision of Dr. Delton Coombes and with ultrasound guidance for real time vessel canulation.     Rachael Brim, NP-C [facility] Pgr: [phone] or [phone]   Levy Pupa, MD, PhD 05/18/2012, 3:22 PM [facility] [phone] or if no answer [phone]

## 2012-05-18 NOTE — Progress Notes (Signed)
  Echocardiogram 2D Echocardiogram has been performed.  Georgian Co 05/18/2012, 4:55 PM

## 2012-05-18 NOTE — Progress Notes (Signed)
ANTIBIOTIC CONSULT NOTE - INITIAL  Pharmacy Consult for vancomycin Indication: rule out sepsis and fever  No Known Allergies  Patient Measurements: Height: 5\' 3"  (160 cm) Weight: 142 lb (64.411 kg) (From est dry wt) IBW/kg (Calculated) : 52.4   Vital Signs: Temp: 104 F (40 C) (12/07 0040) Temp src: Rectal (12/07 0040) BP: 137/84 mmHg (12/06 2100) Pulse Rate: 136  (12/06 2100)  Labs:  Basename 05/17/12 1514  WBC 14.3*  HGB 10.9*  PLT 246  LABCREA --  CREATININE 1.40*   Estimated Creatinine Clearance: 33.3 ml/min (by C-G formula based on Cr of 1.4).   Microbiology: No results found for this or any previous visit (from the past 720 hour(s)).  Medical History: Past Medical History  Diagnosis Date  . Breast cancer   . Interstitial cystitis   . Gastric reflux   . Hypertension     Medications:  Prescriptions prior to admission  Medication Sig Dispense Refill  . amLODipine (NORVASC) 5 MG tablet Take 7.5 mg by mouth daily.      . diazepam (VALIUM) 5 MG tablet Take 5 mg by mouth 2 (two) times daily.      . nitrofurantoin (MACRODANTIN) 50 MG capsule Take 50 mg by mouth at bedtime.      Marland Kitchen omeprazole (PRILOSEC) 40 MG capsule Take 40 mg by mouth daily.      . pentosan polysulfate (ELMIRON) 100 MG capsule Take 100 mg by mouth 2 (two) times daily.       Scheduled:    . [COMPLETED] sodium chloride  1,000 mL Intravenous Once  . [COMPLETED] acetaminophen  1,000 mg Oral Once  . metoprolol      . morphine      . [COMPLETED] ondansetron (ZOFRAN) IV  4 mg Intravenous Once  . pantoprazole  40 mg Oral Daily  . pentosan polysulfate  100 mg Oral BID  . [COMPLETED] piperacillin-tazobactam  3.375 g Intravenous Once  . piperacillin-tazobactam (ZOSYN)  IV  3.375 g Intravenous Q8H  . piperacillin-tazobactam (ZOSYN)  IV  3.375 g Intravenous Once  . [COMPLETED] potassium chloride SA  40 mEq Oral Once  . [COMPLETED] sodium chloride  1,000 mL Intravenous Once  . [COMPLETED] sodium  chloride  1,000 mL Intravenous Once  . vancomycin  1,000 mg Intravenous NOW  . vancomycin  1,000 mg Intravenous Q24H  . [DISCONTINUED] sodium chloride   Intravenous STAT  . [DISCONTINUED] amLODipine  7.5 mg Oral Daily  . [DISCONTINUED] diazepam  5 mg Oral BID  . [DISCONTINUED] enoxaparin (LOVENOX) injection  30 mg Subcutaneous Q24H  . [DISCONTINUED] piperacillin-tazobactam  3.375 g Intravenous Q12H  . [DISCONTINUED] sodium chloride  1,000 mL Intravenous Once   Assessment: 71yo female admitted with UTI has now spiked fever from 99.1 to 106.3, order for APAP and cooling blanket ordered prn, to broaden ABX.  Goal of Therapy:  Vancomycin trough level 15-20 mcg/ml  Plan:  Will begin vancomycin 1000mg  IV Q24H and monitor CBC, Cx, levels prn.  Colleen Can PharmD BCPS 05/18/2012,12:51 AM

## 2012-05-18 NOTE — Procedures (Signed)
Placement of right percutaneous nephrostomy with [name] and fluoroscopic guidance.  Placed a 10 French tube without complication.  The right renal collecting system is stone filled.  Aspirated 5 ml of bloody purulent fluid.  Will send fluid for gram stain and culture.  Plan to follow output but will not be surprised if minimal output.

## 2012-05-18 NOTE — Consult Note (Signed)
PULMONARY  / CRITICAL CARE MEDICINE  Name: Rachael Greer MRN: [id number] DOB: 1941-02-27    LOS: 1  REFERRING MD :  Dr Butler Denmark  Primary Urologist:  Dr. Eudelia Bunch - [facility]  CHIEF COMPLAINT:  Sepis - urinary source  BRIEF PATIENT DESCRIPTION: 71 y/o F with PMH of Interstitial Cystitis, chronic nitrofurantoin (followed at [facility]) presented 12/6 to [facility] with 3 day history of fever, malaise, nausea, vomiting, found to have UTI. 12/7 decompensated and was tx to ICU.      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 She reports "feeling bad" for approximately 2 months but worsened over the past 3 days PTA.  12/7 was noted to have fever up to 106, chills, rigors, hypotension.  PCCM consulted for ICU transfer.   Denies chest pain, pain with inspiration, diarrhea, LE swelling.   PAST MEDICAL HISTORY :  Past Medical History  Diagnosis Date  . Breast cancer   . Interstitial cystitis   . Gastric reflux   . Hypertension    History reviewed. No pertinent past surgical history. Prior to Admission medications   Medication Sig Start Date End Date Taking? Authorizing Provider  amLODipine (NORVASC) 5 MG tablet Take 7.5 mg by mouth daily.   Yes Historical Provider, MD  diazepam (VALIUM) 5 MG tablet Take 5 mg by mouth 2 (two) times daily.   Yes Historical Provider, MD  nitrofurantoin  (MACRODANTIN) 50 MG capsule Take 50 mg by mouth at bedtime.   Yes Historical Provider, MD  omeprazole (PRILOSEC) 40 MG capsule Take 40 mg by mouth daily.   Yes Historical Provider, MD  pentosan polysulfate (ELMIRON) 100 MG capsule Take 100 mg by mouth 2 (two) times daily.   Yes Historical Provider, MD   No Known Allergies  FAMILY HISTORY:  History reviewed. No pertinent family history. SOCIAL HISTORY:  reports that she has never smoked. She does not have any smokeless tobacco history on file. Her alcohol and drug histories not on file.  REVIEW OF SYSTEMS:  Unable to complete while on BiPap   INTERVAL HISTORY: Pleasant female on bipap, shaking.    VITAL SIGNS: Temp:  [97.7 F (36.5 C)-106.1 F (41.2 C)] 101.5 F (38.6 C) (12/07 1044) Pulse Rate:  [40-140] 127  (12/07 1044) Resp:  [15-38] 29  (12/07 1044) BP: (78-150)/(44-93) 132/93 mmHg (12/07 1030) SpO2:  [72 %-100 %] 100 % (12/07 1044) FiO2 (%):  [60 %] 60 % (12/07 1018) Weight:  [142 lb (64.411 kg)] 142 lb (64.411 kg) (12/07 0040) HEMODYNAMICS:   VENTILATOR SETTINGS: Vent Mode:  [-]  FiO2 (%):  [60 %] 60 %  INTAKE / OUTPUT: Intake/Output      12/06 0701 - 12/07 0700 12/07 0701 - 12/08 0700   P.O.  240   I.V. (mL/kg)  100 (1.6)   IV Piggyback 1050  37.5   Total Intake(mL/kg) 1050 (16.3) 377.5 (5.9)   Urine (mL/kg/hr) 800 (0.5) 175   Total Output 800 175   Net +250 +202.5          PHYSICAL EXAMINATION: General:  wdwn adult female  Neuro:  AAOx4, speech clear, MAE HEENT:  Mm pink/dry, no jvd Cardiovascular:  s1s2 rrr, no m/r/g Lungs:  resp's even/non-labored, diminished lower lateral Abdomen:  Round/soft, flank tenderness on exam Musculoskeletal:  No acute deformities Skin:  Hot to touch, moist, no edema, no lesions.    LABS: Cbc  Lab 05/18/12 0600 05/17/12 1514  WBC 11.4* --  HGB 7.6* 10.9*  HCT 23.7* 32.3*  PLT 151 246   Chemistry  Lab 05/18/12 0600 05/17/12 1514  NA 139 134*  K 3.2* 3.2*  CL  113* 99  CO2 17* 22  BUN 15 15  CREATININE 1.42* 1.40*  CALCIUM 7.4* 9.5  MG -- --  PHOS -- --  GLUCOSE 103* 125*   Liver fxn  Lab 05/18/12 0600 05/17/12 1514  AST 68* 46*  ALT 44* 45*  ALKPHOS 89 97  BILITOT 0.8 0.8  PROT 4.8* 7.3  ALBUMIN 1.8* 3.0*   coags No results found for this basename: APTT:3,INR:3 in the last 168 hours Sepsis markers  Lab 05/18/12 0600 05/17/12 1518  LATICACIDVEN 1.2 2.2  PROCALCITON -- --   Cardiac markers  Lab 05/17/12 2250 05/17/12 1514  CKTOTAL -- 201*  CKMB -- 1.2  TROPONINI 0.44* <0.30   BNP No results found for this basename: PROBNP:3 in the last 168 hours ABG No results found for this basename: PHART:3,PCO2ART:3,PO2ART:3,HCO3:3,TCO2:3 in the last 168 hours  CBG trend No results found for this basename: GLUCAP:5 in the last 168 hours  IMAGING:  12/7 CXR>>>bilateral airspace disease 12/7 CT ABD / PELVIS>>>New bilateral staghorn calculi - likely infected given clinical history. Mild right hydronephrosis, right renal enlargement and perinephric inflammation may be related to obstruction by the right renal pelvic staghorn calculus or secondary changes from infection/pyelonephritis. No gross focal abscess is identified. Small amount of free pelvic fluid - nonspecific. Nonspecific gallbladder wall thickening. If there is strong clinical suspicion for cholecystitis, consider nuclear medicine study. New moderate bibasilar consolidation/atelectasis with small bilateral pleural effusions.   ECG:  DIAGNOSES: Principal Problem:  *Urosepsis Active Problems:  Hypokalemia  Acute on chronic renal failure  Transaminitis  Leukocytosis  Hyponatremia  Microcytic anemia   ASSESSMENT / PLAN:  PULMONARY  ASSESSMENT: Acute Respiratory Distress  - in setting of bilateral airspace disease associated with sepsis / volume resuscitation and NSTEMI.  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 Isabel Caprice 12/7, rec's perc drain as above.     GASTROINTESTINAL  ASSESSMENT:   Nausea   PLAN:   -PRN zofran  HEMATOLOGIC  ASSESSMENT:   Anemia  -component of dilution with >6L volume resuscitation  PLAN:  -no tx unless hgb <7%, active bleeding -if she evolves septic shock, then transfusion threshod goals would  change to be in line with septic shock protocol   INFECTIOUS  ASSESSMENT:   Pyelonephritis Chronic Interstitial Cystitis - on nitrofurantion as prophylaxis  PLAN:   -zosyn -urine culture -blood cultures -PRN anti-pyretic   ENDOCRINE  ASSESSMENT:   No acute issues  PLAN:   -monitor cbg while NPO  NEUROLOGIC  ASSESSMENT:   Pain  - in setting of bilateral renal stones  PLAN:   -PRN morphine, vicodin   I have personally obtained a history, examined the patient, evaluated laboratory and imaging results, formulated the assessment and plan and placed orders.  CRITICAL CARE: The patient is critically ill with multiple organ systems failure and requires high complexity decision making for assessment and support, frequent evaluation and titration of therapies, application of advanced monitoring technologies and extensive interpretation of multiple databases. Critical Care Time devoted to patient care services described in this note is 90 minutes.    Levy Pupa, MD, PhD 05/18/2012, 3:21 PM [place] [facility] [phone] or if no answer [phone]

## 2012-05-18 NOTE — Progress Notes (Signed)
eLink Physician-Brief Progress Note Patient Name: Linnette Panella DOB: [birth year] MRN: [id number]  Date of Service  05/18/2012   HPI/Events of Note  GNR bacteriemia, source most likely urine    eICU Interventions  Currently on Zosyn; EKG for QTc; if QTc ok, we will double cover with Levaquin.       Serra Younan 05/18/2012, 9:42 PM

## 2012-05-18 NOTE — Progress Notes (Signed)
When pt. Arrived to the unit last night, she was hypertensive.  5 mg loppressor was ordered to be given due to patient's HR being 140's sustained.  Patient then became hypotensive; BP systolic in the 70's and 80's.  She has now received a total of 3500 cc with 3 1,000 cc bolus' and a 500 cc bolus.  At this time, BP is 88/53, 02 91 on 6L, HR 78 and rr 15.  Will continue to monitor.

## 2012-05-18 NOTE — Progress Notes (Signed)
Report given to 2100 -- will take pt down for CT scan and then transfer to 2100. Pt accompanied by myself, Angelique Blonder RN with RRT and Toniann Fail RT. Renette Butters, Viona Gilmore

## 2012-05-18 NOTE — Progress Notes (Signed)
CRITICAL VALUE ALERT  Critical value received: Blood Cultures Aerobic bottle:Positive for gram Negative rods  Date of notification:  05/18/2012  Time of notification:  9:02 PM  Critical value read back:yes  Nurse who received alert:  Carlyon Prows RN  MD notified (1st page):  Dr. Frederico Hamman  Time of first page:  9:03 PM  MD notified (2nd page):  Time of second page:  Responding MD:  Dr. Frederico Hamman  Time MD responded:  9:03 PM

## 2012-05-18 NOTE — Progress Notes (Signed)
CRITICAL VALUE ALERT  Critical value received:Troponin 0.44  Date of notification:  05/17/12  Time of notification:  2250  Critical value read back:yes  Nurse who received alert:  Vivi Martens RN   MD notified (1st page):  Izola Price  Time of first page:  2252  MD notified (2nd page):  Time of second page:  Responding MD:  Izola Price  Time MD responded:  2255

## 2012-05-18 NOTE — Progress Notes (Signed)
Patient ID: Rachael Greer, female   DOB: 28-Jan-1941, 71 y.o.   MRN: [id number] Consulted for an urgent percutaneous nephrostomy tube placement.  71 yo female with sepsis and bilateral kidney stones.  Recent CT demonstrated right kidney inflammation and mild hydronephrosis.  Discussed the nephrostomy tube with patient and informed consent obtained.  Labs and history were reviewed.  Exam demonstrated faint breath sounds bilaterally and regular heart rate.  Ultrasound demonstrated right kidney stones and mild dilatation of a lower pole calyx.  Plan for right nephrostomy in IR with moderate sedation.

## 2012-05-18 NOTE — Progress Notes (Signed)
PT Cancellation Note  Patient Details Name: Rachael Greer MRN: [id number] DOB: 03/09/1941   Cancelled Treatment:    Reason Eval/Treat Not Completed: Patient not medically ready. Pt with elevated troponin last night. Would like to see troponin trending down prior to initiation of PT.   [facility] HELEN 05/18/2012, 8:06 AM Pager: [phone]

## 2012-05-18 NOTE — Progress Notes (Signed)
eLink Physician-Brief Progress Note Patient Name: Rachael Greer DOB: 06/21/1940 MRN: [id number]  Date of Service  05/18/2012   HPI/Events of Note  Hypotension; patient with sepsis related to a UTI with staghorn calculi and hydronephrosis; CVP 10-12; received approximately 6L of fluid (not documented); CXR and CT abdomen consistent with pulmonary edema; NSTEMI;   eICU Interventions  Place a line; document accurate BP by aline;; start levophed      [name] 05/18/2012, 4:39 PM

## 2012-05-18 NOTE — Progress Notes (Signed)
Called by primary RN to evaluate patient. Upon arrival to patients room, Rn, Dr. Butler Denmark and family at bedside.  Patient is lying in bed alert and oriented, with shaking chills fever 99.4 rectal and rising, HR 120's, RR 30's using accessory muscles.  Patient placed on bipap, ABG to follow in 30 minutes. Tylenol supp given by primary RN.  Labs drawn.  Gearldine Bienenstock, NP, CCM at bedside.  NS increased to 100cc per Gearldine Bienenstock, NP.  ICU bed requested.  Patient to CT then to 2102, Patient transported via bed with monitor and bipap machine on.  Family updated

## 2012-05-18 NOTE — Progress Notes (Signed)
Spoke with Dr. Butler Denmark @1000  -- pt using accessory muscles to breathe, respirations in 30s-40s, HR 110-120s, and temp is starting to rise(see vitals) Discussed past BPs. -- plan of care: Dr. Butler Denmark to come assess pt. Renette Butters, Viona Gilmore

## 2012-05-18 NOTE — Progress Notes (Signed)
eLink Physician-Brief Progress Note Patient Name: Rachael Greer DOB: [birth year] MRN: [id number]  Date of Service  05/18/2012   HPI/Events of Note  Hypotension in patient who had received BB earlier for tachycardia in the setting of fever.  Now HR of 97 and BP of 83/44.  Had received 2 liters of NS for hypotension earlier and responded.  Sats of 96% with RR of 19 in NAD   eICU Interventions  Plan: 500 cc bolus NS for hypotension   Intervention Category Intermediate Interventions: Hypotension - evaluation and management  Labib Cwynar 05/18/2012, 2:46 AM

## 2012-05-19 ENCOUNTER — Inpatient Hospital Stay (HOSPITAL_COMMUNITY): Payer: PRIVATE HEALTH INSURANCE

## 2012-05-19 DIAGNOSIS — R6521 Severe sepsis with septic shock: Secondary | ICD-10-CM | POA: Diagnosis present

## 2012-05-19 DIAGNOSIS — A419 Sepsis, unspecified organism: Secondary | ICD-10-CM | POA: Diagnosis present

## 2012-05-19 LAB — CBC
MCH: 24.9 pg — ABNORMAL LOW (ref 26.0–34.0)
Platelets: 179 10*3/uL (ref 150–400)
RBC: 3.57 MIL/uL — ABNORMAL LOW (ref 3.87–5.11)
RDW: 14.6 % (ref 11.5–15.5)
WBC: 17.9 10*3/uL — ABNORMAL HIGH (ref 4.0–10.5)

## 2012-05-19 LAB — BASIC METABOLIC PANEL
Calcium: 8.3 mg/dL — ABNORMAL LOW (ref 8.4–10.5)
Creatinine, Ser: 1.15 mg/dL — ABNORMAL HIGH (ref 0.50–1.10)
GFR calc non Af Amer: 47 mL/min — ABNORMAL LOW (ref >90)
Sodium: 140 mEq/L (ref 135–145)

## 2012-05-19 MED ORDER — POTASSIUM CHLORIDE CRYS ER 20 MEQ PO TBCR
40.0000 meq | EXTENDED_RELEASE_TABLET | Freq: Once | ORAL | Status: AC
  Administered 2012-05-19: 40 meq via ORAL
  Filled 2012-05-19: qty 2

## 2012-05-19 MED ORDER — LEVOFLOXACIN IN D5W 750 MG/150ML IV SOLN
750.0000 mg | Freq: Once | INTRAVENOUS | Status: AC
  Administered 2012-05-19: 750 mg via INTRAVENOUS
  Filled 2012-05-19: qty 150

## 2012-05-19 MED ORDER — ACETAMINOPHEN 325 MG PO TABS
650.0000 mg | ORAL_TABLET | Freq: Four times a day (QID) | ORAL | Status: DC | PRN
  Administered 2012-05-19: 650 mg via ORAL
  Filled 2012-05-19 (×2): qty 2

## 2012-05-19 MED ORDER — WHITE PETROLATUM GEL
Status: AC
  Administered 2012-05-19: 0.2
  Filled 2012-05-19: qty 5

## 2012-05-19 MED ORDER — LEVOFLOXACIN IN D5W 750 MG/150ML IV SOLN: 750.0000 mg | INTRAVENOUS | Status: DC

## 2012-05-19 NOTE — Consult Note (Signed)
PULMONARY  / CRITICAL CARE MEDICINE  Name: Rachael Greer MRN: [id number] DOB: Apr 22, 1941    LOS: 2  REFERRING MD :  Dr Butler Denmark  Primary Urologist:  Dr. Eudelia Bunch - [facility]  CHIEF COMPLAINT:  Sepis - urinary source  BRIEF PATIENT DESCRIPTION: 71 y/o F with PMH of Interstitial Cystitis, chronic nitrofurantoin (followed at [facility]) presented 12/6 to [facility] with 3 day history of fever, malaise, nausea, vomiting, found to have UTI. 12/7 decompensated and was tx to ICU.     LINES / TUBES: 12/7 R IJ TLC>>>  CULTURES: 12/6 BCx2>>>proteus>>> 12/6 UC>>>   ANTIBIOTICS: 12/6 Vanco>>>12/7 12/6 Zosyn>>>  SIGNIFICANT EVENTS:  12/6 - Admit, UTI, sepsis 12/7 - decompensated, rigors, mild hypotension, changes in cxr concerning for ARDS 12/7 ECHO>>>EF 60-65%,nml wall motion, Grade 1 diastolic dysfunction, pa peak 31  LEVEL OF CARE:  ICU PRIMARY SERVICE:  TRH-->PCCM CONSULTANTS:  PCCM CODE STATUS: FULL Code DIET:  NPO x ice  DVT Px:  heparin GI Px:  protonix   INTERVAL HISTORY:  Much improved from yesterday.  Fever reduced.  Denies pain.  Asking to eat    VITAL SIGNS: Temp:  [97.4 F (36.3 C)-104.7 F (40.4 C)] 97.4 F (36.3 C) (12/08 0423) Pulse Rate:  [40-130] 83  (12/08 0600) Resp:  [9-38] 22  (12/08 0600) BP: (64-149)/(34-93) 111/58 mmHg (12/08 0600) SpO2:  [72 %-100 %] 100 % (12/08 0600) FiO2 (%):  [60 %] 60 % (12/08 0524) HEMODYNAMICS: CVP:  [10 mmHg-12 mmHg] 11 mmHg VENTILATOR SETTINGS: Vent Mode:  [-] BIPAP FiO2 (%):  [60 %] 60 % Set Rate:  [20 bmp] 20 bmp PEEP:  [8 cmH20] 8 cmH20  INTAKE / OUTPUT: Intake/Output      12/07 0701 - 12/08 0700 12/08 0701 - 12/09 0700   P.O. 330    I.V. (mL/kg) 2321.8 (36)    IV Piggyback 300    Total Intake(mL/kg) 2951.8 (45.8)    Urine (mL/kg/hr) 1925 (1.2)    Stool 150    Total Output 2075    Net +876.8         Stool Occurrence 150 x      PHYSICAL EXAMINATION: General:  wdwn adult female  Neuro:  AAOx4, speech clear,  MAE HEENT:  Mm pink/dry, no jvd Cardiovascular:  s1s2 rrr, no m/r/g Lungs:  resp's even/non-labored, diminished lower lateral Abdomen:  Round/soft, flank tenderness on exam Musculoskeletal:  No acute deformities Skin:  Hot to touch, moist, no edema, no lesions.    LABS: Cbc  Lab 05/18/12 1728 05/18/12 0600 05/17/12 1514  WBC 12.2* -- --  HGB 8.0* 7.6* 10.9*  HCT 24.1* 23.7* 32.3*  PLT 141* 151 246   Chemistry  Lab 05/18/12 1728 05/18/12 1021 05/18/12 0600 05/17/12 1514  NA 141 -- 139 134*  K 3.0* -- 3.2* 3.2*  CL 114* -- 113* 99  CO2 18* -- 17* 22  BUN 19 -- 15 15  CREATININE 1.45* -- 1.42* 1.40*  CALCIUM 7.8* -- 7.4* 9.5  MG 1.4* 1.5 -- --  PHOS -- -- -- --  GLUCOSE 96 -- 103* 125*   Liver fxn  Lab 05/18/12 1728 05/18/12 0600 05/17/12 1514  AST 71* 68* 46*  ALT 48* 44* 45*  ALKPHOS 210* 89 97  BILITOT 0.7 0.8 0.8  PROT 5.1* 4.8* 7.3  ALBUMIN 2.0* 1.8* 3.0*   coags  Lab 05/18/12 1312  APTT 35  INR 1.28   Sepsis markers  Lab 05/18/12 1041 05/18/12 1021 05/18/12 0600  05/17/12 1518  LATICACIDVEN 3.4* -- 1.2 2.2  PROCALCITON -- >175.00 -- --   Cardiac markers  Lab 05/19/12 0052 05/18/12 1725 05/18/12 1540 05/18/12 1021 05/17/12 1514  CKTOTAL -- -- -- 1690* 201*  CKMB -- -- -- 5.4* 1.2  TROPONINI <0.30 0.73* 0.71* -- --   BNP No results found for this basename: PROBNP:3 in the last 168 hours ABG No results found for this basename: PHART:3,PCO2ART:3,PO2ART:3,HCO3:3,TCO2:3 in the last 168 hours  CBG trend  Lab 05/18/12 1505 05/18/12 1131  GLUCAP 98 126*    IMAGING:  12/7 CXR>>>bilateral airspace disease 12/7 CT ABD / PELVIS>>>New bilateral staghorn calculi - likely infected given clinical history. Mild right hydronephrosis, right renal enlargement and perinephric inflammation may be related to obstruction by the right renal pelvic staghorn calculus or secondary changes from infection/pyelonephritis. No gross focal abscess is identified. Small amount  of free pelvic fluid - nonspecific. Nonspecific gallbladder wall thickening. If there is strong clinical suspicion for cholecystitis, consider nuclear medicine study. New moderate bibasilar consolidation/atelectasis with small bilateral pleural effusions.   ECG:  DIAGNOSES: Principal Problem:  *Urosepsis Active Problems:  Hypokalemia  Acute on chronic renal failure  Transaminitis  Leukocytosis  Hyponatremia  Microcytic anemia   ASSESSMENT / PLAN:  PULMONARY  ASSESSMENT: Acute Respiratory Distress  - in setting of bilateral airspace disease associated with sepsis / volume resuscitation and NSTEMI.  Consider ALI/ARDS vs cardiogenic edema  PLAN:   -BiPAP support PRN -O2 to keep sats >92% -IS / Pulmonary hygiene -f/u cxr in am  CARDIOVASCULAR  ASSESSMENT:  NSTEMI - in setting of sepsis, volume resuscitation  PLAN:  -asa -cycle enzymes, peaked -ECHO reviewed -cxr changes most likely ALI   RENAL  ASSESSMENT:   Bilateral Renal Calculi - bilateral staghorn renal calculi, obstruction on R with Mild right hydronephrosis, right renal enlargement and perinephric inflammation  Hypokalemia  PLAN:   -Perc drain per IR -Discussed with Dr. Isabel Caprice 12/7, rec's perc drain as above.  Recommends she potentially follow up with [facility] as outpatient with drain vs. Transfer to [facility] for intervention.  May need to call on Monday to re-eval with Urology.  -replace K -f/u bmp in am  GASTROINTESTINAL  ASSESSMENT:   Nausea   PLAN:   -PRN zofran  HEMATOLOGIC  ASSESSMENT:   Anemia  -component of dilution with >6L volume resuscitation  PLAN:  -no tx unless hgb <7%, active bleeding -if she evolves septic shock, then transfusion threshod goals would change to be in line with septic shock protocol   INFECTIOUS  ASSESSMENT:   Pyelonephritis Chronic Interstitial Cystitis - on nitrofurantion as prophylaxis  PLAN:   -zosyn -urine culture -blood cultures -PRN anti-pyretic    ENDOCRINE  ASSESSMENT:   No acute issues  PLAN:   -monitor cbg while NPO  NEUROLOGIC  ASSESSMENT:   Pain  - in setting of bilateral renal stones  PLAN:   -PRN morphine, vicodin   I have personally obtained a history, examined the patient, evaluated laboratory and imaging results, formulated the assessment and plan and placed orders.  CRITICAL CARE: The patient is critically ill with multiple organ systems failure and requires high complexity decision making for assessment and support, frequent evaluation and titration of therapies, application of advanced monitoring technologies and extensive interpretation of multiple databases. Critical Care Time devoted to patient care services described in this note is 30 minutes.    Canary Brim, NP-C [place] [facility] Pgr: [phone] or [phone]  Levy Pupa, MD, PhD 05/19/2012,  2:05 PM [place] [place] [facility] [phone] or if no answer [phone]

## 2012-05-19 NOTE — Progress Notes (Signed)
71yo female with sepsis and on Zosyn, GNR bacteremia noted on blood Cx, to begin Levaquin for double coverage until sensitivity report.  Will begin Levaquin 750mg  IV Q48H and monitor C/S.  Vernard Gambles, PharmD, BCPS 05/19/2012 12:57 AM

## 2012-05-19 NOTE — Progress Notes (Signed)
05:30 Patient placed back on NIV BIPAP.

## 2012-05-19 NOTE — Progress Notes (Signed)
Subjective: Pt clinically much better this am. Afebrile  Objective: Physical Exam: BP 115/65  Pulse 100  Temp 97.4 F (36.3 C) (Oral)  Resp 26  Ht 5\' 3"  (1.6 m)  Wt 142 lb (64.411 kg)  BMI 25.15 kg/m2  SpO2 94% Rt PCN intact, site clean, dry, NT Output thin clear urine, no hematuria Cx pending   Labs: CBC  Basename 05/19/12 0810 05/18/12 1728  WBC 17.9* 12.2*  HGB 8.9* 8.0*  HCT 27.2* 24.1*  PLT 179 141*   BMET  Basename 05/19/12 0810 05/18/12 1728  NA 140 141  K 3.2* 3.0*  CL 111 114*  CO2 17* 18*  GLUCOSE 102* 96  BUN 17 19  CREATININE 1.15* 1.45*  CALCIUM 8.3* 7.8*   LFT  Basename 05/18/12 1728  PROT 5.1*  ALBUMIN 2.0*  AST 71*  ALT 48*  ALKPHOS 210*  BILITOT 0.7  BILIDIR --  IBILI --  LIPASE --   PT/INR  Basename 05/18/12 1312  LABPROT 15.7*  INR 1.28     Studies/Results: Ct Abdomen Pelvis Wo Contrast  05/18/2012  *RADIOLOGY REPORT*  Clinical Data: 71 year old female with abdominal pelvic pain, chronic UTI and pyelonephritis.  CT ABDOMEN AND PELVIS WITHOUT CONTRAST  Technique:  Multidetector CT imaging of the abdomen and pelvis was performed following the standard protocol without intravenous contrast.  Comparison: 04/13/2010 CT  Findings: Moderate bibasilar atelectasis/consolidation with small bilateral pleural effusions noted.  New staghorn calculi are present within the right renal pelvis extending towards the UPJ, posterior right upper pole and posterior left upper pole. The right kidney is enlarged with perinephric inflammation and mild right hydronephrosis. No gross abscess is identified. A 3 mm nonobstructing calculus within the left lower pole is present.  Gallbladder wall thickening is noted - nonspecific.  The liver, spleen, adrenal glands and pancreas are unremarkable.  A small amount of free fluid within the pelvis is identified. A Foley catheter in the bladder is identified. The bowel is within normal limits.  No acute or suspicious bony  abnormalities are present.  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  Mild right hydronephrosis, right renal enlargement and perinephric inflammation may be related to obstruction by the right renal pelvic staghorn calculus or secondary changes from infection/pyelonephritis.  No gross focal abscess is identified.  Small amount of free pelvic fluid - nonspecific.  Nonspecific gallbladder wall thickening.  If there is strong clinical suspicion for cholecystitis, consider nuclear medicine study.  New moderate bibasilar consolidation/atelectasis with small bilateral pleural effusions.   Original Report Authenticated By: Harmon Pier, M.D.    Dg Chest 2 View  05/17/2012  *RADIOLOGY REPORT*  Clinical Data: Fever, vomiting  CHEST - 2 VIEW  Comparison: None.  Findings: Cardiomediastinal silhouette is unremarkable.  No acute infiltrate or pleural effusion.  No pulmonary edema.  Bony thorax is unremarkable.  IMPRESSION: No active disease.   Original Report Authenticated By: Natasha Mead, M.D.    Ir Perc Nephrostomy Right  05/18/2012  *RADIOLOGY REPORT*  Clinical history:71 year old with urosepsis.  The patient has bilateral kidney stones and mild dilatation of right renal collecting system. There is inflammation associated with the right kidney.  PROCEDURE(S): ULTRASOUND AND FLUOROSCOPIC GUIDED RIGHT PERCUTANEOUS NEPHROSTOMY TUBE PLACEMENT  Physician: Rachelle Hora. Lowella Dandy, MD  Medications:Fentanyl 25 mcg.  Fluoroscopy time: 2.7 minutes  Procedure:The procedure was explained to the patient.  The risks and benefits of the procedure were discussed and the patient's questions were addressed.  Informed consent was obtained  from the patient.  The patient was placed prone.  The right flank was prepped and draped in a sterile fashion.  Maximal barrier sterile technique was utilized including caps, mask, sterile gowns, sterile gloves, sterile drape, hand hygiene and skin antiseptic.  The skin was  anesthetized with lidocaine.  Using ultrasound guidance, a 21 gauge needle was directed into a mildly dilated lower pole calix. 0.018 wire was advanced into the collecting system.  An Accustick dilator set was placed.  A Kumpe catheter was advanced into the renal pelvis.  Small amount of contrast was injected to confirm placement within the collecting system.  A J wire was placed.  The tract was dilated and a 10-French Renee Pain catheter was placed within the upper aspect of the renal pelvis.  Catheter was sutured to the skin and attached to a gravity bag.  Approximately 5 ml of bloody purulent fluid was aspirated from the collecting system.  Findings:There are multiple stones within the right kidney.  There is a large staghorn calculus involving the renal pelvis.  A small amount of purulent fluid was aspirated from the renal collecting system.  Complications: None  Impression:Successful placement of a right percutaneous nephrostomy tube.  Small amount of bloody purulent fluid was aspirated from the collecting system and findings are suggestive for pyonephrosis. Fluid was sent for Gram stain and culture.   Original Report Authenticated By: Richarda Overlie, M.D.    Ir US Guide Bx Asp/drain  05/18/2012  *RADIOLOGY REPORT*  Clinical history:71 year old with urosepsis.  The patient has bilateral kidney stones and mild dilatation of right renal collecting system. There is inflammation associated with the right kidney.  PROCEDURE(S): ULTRASOUND AND FLUOROSCOPIC GUIDED RIGHT PERCUTANEOUS NEPHROSTOMY TUBE PLACEMENT  Physician: Rachelle Hora. Lowella Dandy, MD  Medications:Fentanyl 25 mcg.  Fluoroscopy time: 2.7 minutes  Procedure:The procedure was explained to the patient.  The risks and benefits of the procedure were discussed and the patient's questions were addressed.  Informed consent was obtained from the patient.  The patient was placed prone.  The right flank was prepped and draped in a sterile fashion.  Maximal barrier sterile  technique was utilized including caps, mask, sterile gowns, sterile gloves, sterile drape, hand hygiene and skin antiseptic.  The skin was anesthetized with lidocaine.  Using ultrasound guidance, a 21 gauge needle was directed into a mildly dilated lower pole calix. 0.018 wire was advanced into the collecting system.  An Accustick dilator set was placed.  A Kumpe catheter was advanced into the renal pelvis.  Small amount of contrast was injected to confirm placement within the collecting system.  A J wire was placed.  The tract was dilated and a 10-French Renee Pain catheter was placed within the upper aspect of the renal pelvis.  Catheter was sutured to the skin and attached to a gravity bag.  Approximately 5 ml of bloody purulent fluid was aspirated from the collecting system.  Findings:There are multiple stones within the right kidney.  There is a large staghorn calculus involving the renal pelvis.  A small amount of purulent fluid was aspirated from the renal collecting system.  Complications: None  Impression:Successful placement of a right percutaneous nephrostomy tube.  Small amount of bloody purulent fluid was aspirated from the collecting system and findings are suggestive for pyonephrosis. Fluid was sent for Gram stain and culture.   Original Report Authenticated By: Richarda Overlie, M.D.    Dg Chest Port 1 View  05/19/2012  *RADIOLOGY REPORT*  Clinical Data: Wheezing.  Chest pain  with deep breathing.  Fever.  PORTABLE CHEST - 1 VIEW  Comparison: 05/18/2012  Findings: Right lung base airspace opacity has increased.  There is persistent left perihilar left medial lung base opacity, which may also be mildly increased.  The right internal jugular central venous line is stable well- positioned.  The heart is normal in size.  The mediastinum is normal in contour caliber.  No pneumothorax.  IMPRESSION: There has been an interval increase in lung base opacity most evident on the right.  This supports bilateral  pneumonia as the cause of these airspace opacities.   Original Report Authenticated By: Amie Portland, M.D.    Dg Chest Port 1 View  05/18/2012  *RADIOLOGY REPORT*  Clinical Data: 71 year old female with sepsis and fever.  Right central venous catheter placement.  PORTABLE CHEST - 1 VIEW  Comparison: Film earlier this date.  Findings: The cardiomediastinal silhouette is stable. A right IJ central venous catheter has been placed with tip overlying the mid SVC. There is no evidence of pneumothorax. Mild bibasilar opacities, right greater than left are noted may represent atelectasis or pneumonia. There is no evidence of pleural effusion.  IMPRESSION: Right IJ central venous catheter placement with tip overlying the mid SVC - no evidence of pneumothorax.  Persistent bibasilar opacities, right greater than left - question atelectasis versus pneumonia.   Original Report Authenticated By: Harmon Pier, M.D.    Dg Chest Port 1 View  05/18/2012  *RADIOLOGY REPORT*  Clinical Data: Hypoxia.  Fever.  PORTABLE CHEST - 1 VIEW  Comparison: Plain films of the chest 05/17/2012.  Findings: Since yesterday's examination, the patient has developed perihilar and bibasilar airspace disease, greater on the right.  No pneumothorax is identified.  No pleural effusion is seen.  Heart size is normal.  IMPRESSION: New bilateral airspace disease could be due to asymmetric edema and/or pneumonia.   Original Report Authenticated By: Holley Dexter, M.D.     Assessment/Plan: Urosepsis Rt nephrolithiasis. S/p rt PCN, good UOP Cont to follow.    LOS: 2 days    Brayton El PA-C 05/19/2012 10:38 AM

## 2012-05-20 ENCOUNTER — Inpatient Hospital Stay (HOSPITAL_COMMUNITY): Payer: PRIVATE HEALTH INSURANCE

## 2012-05-20 DIAGNOSIS — I248 Other forms of acute ischemic heart disease: Secondary | ICD-10-CM

## 2012-05-20 DIAGNOSIS — R7881 Bacteremia: Secondary | ICD-10-CM

## 2012-05-20 DIAGNOSIS — N132 Hydronephrosis with renal and ureteral calculous obstruction: Secondary | ICD-10-CM | POA: Diagnosis present

## 2012-05-20 DIAGNOSIS — I2489 Other forms of acute ischemic heart disease: Secondary | ICD-10-CM

## 2012-05-20 DIAGNOSIS — I5032 Chronic diastolic (congestive) heart failure: Secondary | ICD-10-CM

## 2012-05-20 DIAGNOSIS — J9601 Acute respiratory failure with hypoxia: Secondary | ICD-10-CM

## 2012-05-20 DIAGNOSIS — D649 Anemia, unspecified: Secondary | ICD-10-CM | POA: Insufficient documentation

## 2012-05-20 DIAGNOSIS — N390 Urinary tract infection, site not specified: Secondary | ICD-10-CM | POA: Diagnosis present

## 2012-05-20 DIAGNOSIS — J9600 Acute respiratory failure, unspecified whether with hypoxia or hypercapnia: Secondary | ICD-10-CM

## 2012-05-20 HISTORY — DX: Other forms of acute ischemic heart disease: I24.89

## 2012-05-20 HISTORY — DX: Chronic diastolic (congestive) heart failure: I50.32

## 2012-05-20 HISTORY — DX: Other forms of acute ischemic heart disease: I24.8

## 2012-05-20 HISTORY — DX: Acute respiratory failure with hypoxia: J96.01

## 2012-05-20 LAB — BASIC METABOLIC PANEL
CO2: 20 mEq/L (ref 19–32)
Calcium: 8.5 mg/dL (ref 8.4–10.5)
Chloride: 109 mEq/L (ref 96–112)
GFR calc Af Amer: 60 mL/min — ABNORMAL LOW (ref >90)
Sodium: 137 mEq/L (ref 135–145)

## 2012-05-20 LAB — URINE CULTURE
Colony Count: 85000
Colony Count: 75000

## 2012-05-20 LAB — CULTURE, BLOOD (ROUTINE X 2)

## 2012-05-20 LAB — CBC
MCV: 75.3 fL — ABNORMAL LOW (ref 78.0–100.0)
Platelets: 181 10*3/uL (ref 150–400)
RBC: 3.4 MIL/uL — ABNORMAL LOW (ref 3.87–5.11)
WBC: 12.7 10*3/uL — ABNORMAL HIGH (ref 4.0–10.5)

## 2012-05-20 MED ORDER — DIAZEPAM 5 MG PO TABS: 5.0000 mg | ORAL_TABLET | Freq: Two times a day (BID) | ORAL | Status: DC | PRN

## 2012-05-20 MED ORDER — SODIUM CHLORIDE 0.9 % IJ SOLN
10.0000 mL | Freq: Two times a day (BID) | INTRAMUSCULAR | Status: DC
  Administered 2012-05-20: 20 mL

## 2012-05-20 MED ORDER — POTASSIUM CHLORIDE CRYS ER 20 MEQ PO TBCR
30.0000 meq | EXTENDED_RELEASE_TABLET | ORAL | Status: AC
  Administered 2012-05-20 (×2): 30 meq via ORAL
  Filled 2012-05-20 (×2): qty 1

## 2012-05-20 MED ORDER — SODIUM CHLORIDE 0.9 % IJ SOLN
10.0000 mL | INTRAMUSCULAR | Status: DC | PRN
  Administered 2012-05-21 – 2012-05-22 (×2): 10 mL

## 2012-05-20 MED ORDER — DEXTROSE 5 % IV SOLN
2.0000 g | INTRAVENOUS | Status: DC
  Administered 2012-05-20 – 2012-05-23 (×4): 2 g via INTRAVENOUS
  Filled 2012-05-20 (×4): qty 2

## 2012-05-20 MED ORDER — OXYCODONE HCL 5 MG PO TABS
5.0000 mg | ORAL_TABLET | ORAL | Status: DC | PRN
  Administered 2012-05-21: 5 mg via ORAL
  Administered 2012-05-21 – 2012-05-22 (×2): 10 mg via ORAL
  Filled 2012-05-20: qty 2
  Filled 2012-05-20: qty 1
  Filled 2012-05-20: qty 2

## 2012-05-20 NOTE — Progress Notes (Signed)
Patient transferred from 2100. She is A&O x 4. Skin is intact. Does not use equipment to ambulate, but is weak.

## 2012-05-20 NOTE — Progress Notes (Signed)
Rehab Admissions Coordinator Note:  Patient was screened by Clois Dupes for appropriateness for an Inpatient Acute Rehab Consult.  [employer] will not approve admission for deconditioned related to sepsis.   At this time, we are recommending Skilled Nursing Facility.  Clois Dupes, RN 05/20/2012, 8:26 PM  I can be reached at [phone].

## 2012-05-20 NOTE — Progress Notes (Signed)
[facility] ADULT ICU REPLACEMENT PROTOCOL FOR AM LAB REPLACEMENT ONLY  The patient does apply for the [facility] Adult ICU Electrolyte Replacment Protocol based on the criteria listed below:   1. Is GFR >/= 50 ml/min? yes  Patient's GFR today is 52 2. Is urine output >/= 0.5 ml/kg/hr for the last 8 hours? yes Patient's UOP is 2.0 ml/kg/hr 3. Is BUN < 30 mg/dL? yes  Patient's BUN today is 16 4. Abnormal electrolyte(s): K (3.2) 5. Ordered repletion with:   Melrose Nakayama 05/20/2012 6:10 AM

## 2012-05-20 NOTE — Progress Notes (Signed)
Occupational Therapy Evaluation Patient Details Name: Rachael Greer MRN: [id number] DOB: 22-Feb-1941 Today's Date: 05/20/2012 Time: 4098-1191 OT Time Calculation (min): 28 min  OT Assessment / Plan / Recommendation Clinical Impression  Pleasant 71 yo admitted with septic shock. Pt apparently laid on floor 1-2 days before she was found. Pt with urosepsis and acte respiratory distress. PTA, pt lived independently alone without AD. Pt is very deconditioned from prolonged hospitalization and requires max A with ADL and mod A with mobility. Pt will benefit from rehab at CIR to return to Mod I level in order for pt to return home. Pt has very supportive family. Pt interested in CIR. Pt will benefit from skilled OT services to max independence with ADL and functional moiblity for ADL to facilitate D/C to CIR.    OT Assessment  Patient needs continued OT Services    Follow Up Recommendations  CIR    Barriers to Discharge None    Equipment Recommendations  3 in 1 bedside comode    Recommendations for Other Services Rehab consult  Frequency  Min 3X/week    Precautions / Restrictions Precautions Precautions: Fall   Pertinent Vitals/Pain C/o pain ni side - did not rate    ADL  Eating/Feeding: Other (comment) (eating liquid diet ) Grooming: Set up;Supervision/safety Where Assessed - Grooming: Supported sitting Upper Body Bathing: Minimal assistance Where Assessed - Upper Body Bathing: Supported sitting Lower Body Bathing: Maximal assistance Where Assessed - Lower Body Bathing: Supported sit to stand Upper Body Dressing: Minimal assistance Where Assessed - Upper Body Dressing: Unsupported sitting Lower Body Dressing: Maximal assistance Where Assessed - Lower Body Dressing: Supported sit to Pharmacist, hospital: Moderate assistance Toilet Transfer Method: Sit to stand Toilet Transfer Equipment: Other (comment) (bed - chair - foley) Toileting - Clothing Manipulation and Hygiene: Moderate  assistance Where Assessed - Toileting Clothing Manipulation and Hygiene: Sit to stand from 3-in-1 or toilet;Standing Tub/Shower Transfer: Minimal assistance;Other (comment) (step over threshold) Tub/Shower Transfer Method: Ambulating Tub/Shower Transfer Equipment: Other (comment) (3 in 1) Equipment Used: Gait belt;Rolling walker Transfers/Ambulation Related to ADLs: mod A from lower level ADL Comments: limited by pain and poor endurance    OT Diagnosis: Generalized weakness;Acute pain  OT Problem List: Decreased strength;Decreased activity tolerance;Impaired balance (sitting and/or standing);Decreased knowledge of use of DME or AE;Cardiopulmonary status limiting activity;Pain OT Treatment Interventions: Self-care/ADL training;Therapeutic exercise;Energy conservation;DME and/or AE instruction;Therapeutic activities;Patient/family education;Balance training   OT Goals Acute Rehab OT Goals OT Goal Formulation: With patient Time For Goal Achievement: 06/03/12 Potential to Achieve Goals: Good ADL Goals Pt Will Perform Grooming: with supervision;Standing at sink;Unsupported ADL Goal: Grooming - Progress: Goal set today Pt Will Perform Upper Body Bathing: with supervision;with set-up;Sitting, edge of bed;Unsupported ADL Goal: Upper Body Bathing - Progress: Goal set today Pt Will Perform Lower Body Bathing: with supervision;Sit to stand from bed;Unsupported ADL Goal: Lower Body Bathing - Progress: Goal set today Pt Will Transfer to Toilet: with supervision;Ambulation;3-in-1 ADL Goal: Toilet Transfer - Progress: Goal set today Pt Will Perform Toileting - Clothing Manipulation: with modified independence;Sitting on 3-in-1 or toilet;Standing ADL Goal: Toileting - Clothing Manipulation - Progress: Goal set today Pt Will Perform Toileting - Hygiene: with modified independence;Standing at 3-in-1/toilet;Sit to stand from 3-in-1/toilet ADL Goal: Toileting - Hygiene - Progress: Goal set  today Additional ADL Goal #1: Pt will verbalize 3 E conservation techniques for ADL independently ADL Goal: Additional Goal #1 - Progress: Goal set today Additional ADL Goal #2: Pt will complete functional mobility @ RW level  with S during ADL task  ADL Goal: Additional Goal #2 - Progress: Goal set today Arm Goals Pt Will Complete Theraband Exer: with supervision, verbal cues required/provided;to increase strength;Bilateral upper extremities;1 set;Level 1 Theraband Arm Goal: Theraband Exercises - Progress: Goal set today  Visit Information  Last OT Received On: 05/20/12 Assistance Needed: +1    Subjective Data  Subjective: I want to be independent again   Prior Functioning     Home Living Lives With: Alone Available Help at Discharge: Family;Available PRN/intermittently Type of Home: House (townhome) Home Access: Stairs to enter [employer] of Steps: 1 Entrance Stairs-Rails: None Home Layout: One level Bathroom Shower/Tub: Health visitor: Handicapped height Bathroom Accessibility: Yes How Accessible: Accessible via walker Home Adaptive Equipment: Built-in shower seat;Hand-held shower hose Prior Function Level of Independence: Independent Able to Take Stairs?: Yes Driving: Yes Vocation: Retired Comments: loves to read Communication Communication: No difficulties Dominant Hand: Right         Vision/Perception  WFL   Cognition  Overall Cognitive Status: Appears within functional limits for tasks assessed/performed Arousal/Alertness: Awake/alert Orientation Level: Appears intact for tasks assessed Behavior During Session: [facility] for tasks performed    Extremity/Trunk Assessment Right Upper Extremity Assessment RUE ROM/Strength/Tone: Deficits RUE ROM/Strength/Tone Deficits: general weakness RUE Sensation: WFL - Light Touch;WFL - Proprioception RUE Coordination: WFL - gross/fine motor Left Upper Extremity Assessment LUE  ROM/Strength/Tone: Deficits LUE ROM/Strength/Tone Deficits: general weakness LUE Sensation: WFL - Light Touch;WFL - Proprioception LUE Coordination: WFL - gross/fine motor Right Lower Extremity Assessment RLE ROM/Strength/Tone: Deficits RLE ROM/Strength/Tone Deficits: Generally weak, requiring physical assist for sit to stand RLE Sensation: WFL - Light Touch;WFL - Proprioception RLE Coordination: WFL - gross/fine motor Left Lower Extremity Assessment LLE ROM/Strength/Tone: Deficits LLE ROM/Strength/Tone Deficits: Generally weak Trunk Assessment Trunk Assessment: Normal     Mobility Bed Mobility Bed Mobility: Sit to Supine;Sit to Sidelying Left;Scooting to [facility] Rolling Left: 4: Min assist;With rail Left Sidelying to Sit: 3: Mod assist;With rails Sitting - Scoot to Edge of Bed: 4: Min guard;With rail Sit to Supine: 2: Max assist;HOB flat;With rail;Other (comment) (A to lift BLE) Sit to Sidelying Left: 2: Max assist;HOB flat;With rail Scooting to [facility]: 3: Mod assist Details for Bed Mobility Assistance: A to lift BLE onto bed Transfers Transfers: Sit to Stand;Stand to Sit Sit to Stand: 3: Mod assist;With upper extremity assist;From chair/3-in-1 Stand to Sit: 4: Min assist;With upper extremity assist;To bed Details for Transfer Assistance: Cues for safe hand placement and technique     Shoulder Instructions     Exercise     Balance  MinA   End of Session OT - End of Session Equipment Utilized During Treatment: Gait belt Activity Tolerance: Patient tolerated treatment well Patient left: in bed;with call bell/phone within reach;with family/visitor present Nurse Communication: Mobility status  GO     Caesar Mannella,HILLARY 05/20/2012, 6:13 PM [facility], OTR/L  [phone] 05/20/2012

## 2012-05-20 NOTE — Progress Notes (Signed)
[facility] Progress Note [place] TEAM 1 - Stepdown/ICU TEAM   Rachael Greer NWG:[id number] DOB: [birth year] DOA: 05/17/2012 PCP: No primary provider on file.  Brief narrative: 71 y/o F with PMH of Interstitial Cystitis, chronic nitrofurantoin (followed at [facility]) presented 12/6 to [facility] with 3 day history of fever, malaise, nausea, vomiting, found to have UTI. 12/7 decompensated from a respiratory standpoint and was tx to ICU. Clinical exam and chest x-ray findings consistent with acute lung injury. Did not require intubation. Cardiac markers were also slightly elevated consistent with demand ischemia. Echocardiogram showed no regional wall motion abnormalities and EKG showed no ischemic changes.  Urine culture revealed Proteus urinary tract infection with associated bacteremia. CT scan revealed bilateral renal calculi with right hydronephrosis and she is status post placement of a percutaneous nephrostomy tube. She stabilized from a respiratory and hemodynamic standpoint and was transferred to step down. Team 1 assumed care of this Rachael Greer on 05/20/2012.  Assessment/Plan:  Septic shock(785.52) due to: A) UTI (lower urinary tract infection) 2/2 Proteus B)  Bacteremia 2/2 UTI *Focal antibiotic coverage continues with Rocephin *Leave Foley catheter in place for an additional 24 hours *Hemodynamically stable - IV fluids previously discontinued *Procalcitonin at presentation was greater than 175 and as of today has decreased to 121  Acute respiratory failure with hypoxia 2/2 ALI *Continues to require low flow oxygen and hopefully can wean *Antibiotics adjusted as above *Pulmonary medicine has signed off  Acute on chronic renal failure *Resolved  Transaminitis *Secondary to septic shock *We will check LFTs in a.m.  Right Hydronephrosis with ureteral calculus/bilateral ureteral calculi *Critical care medicine spoke with Dr. Esmond Camper and Rachael Greer subsequently underwent  percutaneous nephrostomy tube placement per interventional radiology *Dr. Sharon Seller contacted Urologist on call 05/20/2012 and recommendations are to leave the nephrostomy tube in place until the Rachael Greer can followup with her primary urologist Dr. Logan Bores at [facility] after discharge - she will remain on abx   Demand ischemia *Only mild elevation troponin without EKG changes and no focal regional wall motion abnormalities on echo so suspect etiology from hypotension and septic shock  Hypokalemia/Hyponatremia *Sodium has normalized - potassium remains low so we'll continue oral repletion and follow electrolytes  Microcytic anemia *Likely related to acute bone marrow suppression from shock and sepsis with baseline hemoglobin between 9 and 10  Chronic diastolic heart failure, NYHA class 1 *Currently compensated  Interstitial cystitis *Continue Elmiron *Would not continue Macrodantin after discharged since current urinary organism is resistant and likely most organisms have developed resistance to this chronic medication - also, this drug carries a risk of pulmonary fibrosis (pt has experienced ?ALI this admit)  DVT prophylaxis: Subcutaneous heparin Code Status: Full Family Communication: Spoke to Rachael Greer Disposition Plan: Transfer to floor  Consultants: Telephone consultation with urology x2 Pulmonary critical care medicine-signed off 05/20/2012  Procedures: 12/7 R IJ TLC>>> 12/7 placement of right percutaneous nephrostomy tube with ultrasound and fluoroscopic guidance by Dr. Lowella Dandy with interventional radiology  CULTURES:  12/6 BCx2>>>proteus resistant to Macrodantin 12/6 UC>>> Proteus resistant to Macrodantin  Antibiotics: 12/6 Vanco>>>12/7  12/6 Zosyn>>> 12/9 Rocephin 12/9 >>>  HPI/Subjective: Rachael Greer alert and complains of generalized fatigue. States usual interstitial cystitis symptoms are well-controlled at the present time. Needed encouragement that she is improving  clinically. Denies chest pain or shortness of breath.   Objective: Blood pressure 106/66, pulse 98, temperature 98.3 F (36.8 C), temperature source Oral, resp. rate 23, height 5\' 3"  (1.6 m), weight 64.411 kg (142 lb), SpO2 95.00%.  Intake/Output Summary (Last 24 hours) at 05/20/12 1159 Last data filed at 05/20/12 1000  Gross per 24 hour  Intake   2897 ml  Output   3846 ml  Net   -949 ml     Exam: General: No acute respiratory distress Lungs: Clear to auscultation bilaterally without wheezes or crackles, 2 L nasal cannula oxygen Cardiovascular: Regular rate and rhythm without murmur gallop or rub normal S1 and S2, no peripheral edema, no JVD Abdomen: Nontender, nondistended, soft, bowel sounds positive, no rebound, no ascites, no appreciable mass Genitourinary: Right percutaneous nephrostomy tube to straight drain with yellow return, Foley catheter in place with yellow return Musculoskeletal: No significant cyanosis, clubbing of bilateral lower extremities Neurological: Rachael Greer is alert and oriented x3, moves all extremities x4, exam is nonfocal, cranial nerves II through XII are grossly intact  Data Reviewed: Basic Metabolic Panel:  Lab 05/20/12 1610 05/19/12 0810 05/18/12 1728 05/18/12 1021 05/18/12 0600 05/17/12 1514  NA 137 140 141 -- 139 134*  K 3.2* 3.2* 3.0* -- 3.2* 3.2*  CL 109 111 114* -- 113* 99  CO2 20 17* 18* -- 17* 22  GLUCOSE 90 102* 96 -- 103* 125*  BUN 16 17 19  -- 15 15  CREATININE 1.05 1.15* 1.45* -- 1.42* 1.40*  CALCIUM 8.5 8.3* 7.8* -- 7.4* 9.5  MG -- -- 1.4* 1.5 -- --  PHOS -- -- -- -- -- --   Liver Function Tests:  Lab 05/18/12 1728 05/18/12 0600 05/17/12 1514  AST 71* 68* 46*  ALT 48* 44* 45*  ALKPHOS 210* 89 97  BILITOT 0.7 0.8 0.8  PROT 5.1* 4.8* 7.3  ALBUMIN 2.0* 1.8* 3.0*   CBC:  Lab 05/20/12 0445 05/19/12 0810 05/18/12 1728 05/18/12 0600 05/17/12 1514  WBC 12.7* 17.9* 12.2* 11.4* 14.3*  NEUTROABS -- -- -- -- 13.3*  HGB 8.6* 8.9* 8.0*  7.6* 10.9*  HCT 25.6* 27.2* 24.1* 23.7* 32.3*  MCV 75.3* 76.2* 77.0* 77.7* 75.5*  PLT 181 179 141* 151 246   Cardiac Enzymes:  Lab 05/19/12 0052 05/18/12 1725 05/18/12 1540 05/18/12 1021 05/17/12 2250 05/17/12 1514  CKTOTAL -- -- -- 1690* -- 201*  CKMB -- -- -- 5.4* -- 1.2  CKMBINDEX -- -- -- -- -- --  TROPONINI <0.30 0.73* 0.71* -- 0.44* <0.30   CBG:  Lab 05/18/12 1505 05/18/12 1131  GLUCAP 98 126*    Recent Results (from the past 240 hour(s))  CULTURE, BLOOD (ROUTINE X 2)     Status: Normal   Collection Time   05/17/12  3:30 PM      Component Value Range Status Comment   Specimen Description BLOOD RIGHT THUMB   Final    Special Requests BOTTLES DRAWN AEROBIC AND ANAEROBIC [facility] EACH   Final    Culture  Setup Time 05/18/2012 00:54   Final    Culture     Final    Value: PROTEUS MIRABILIS     Note: Gram Stain Report Called to,Read Back By and Verified With: SARA GROCE 05/18/12 @ 1:20PM BY RUSCA.   Report Status 05/20/2012 FINAL   Final    Organism ID, Bacteria PROTEUS MIRABILIS   Final   CULTURE, BLOOD (ROUTINE X 2)     Status: Normal   Collection Time   05/17/12  3:43 PM      Component Value Range Status Comment   Specimen Description BLOOD RIGHT HAND   Final    Special Requests BOTTLES DRAWN AEROBIC AND ANAEROBIC 5CC EACH  Final    Culture  Setup Time 05/18/2012 00:54   Final    Culture     Final    Value: PROTEUS MIRABILIS     Note: SUSCEPTIBILITIES PERFORMED ON PREVIOUS CULTURE WITHIN THE LAST 5 DAYS.     Note: Gram Stain Report Called to,Read Back By and Verified With: [facility] WHITE 05/18/12 @ 9:05PM BY RUSCA.   Report Status 05/20/2012 FINAL   Final   URINE CULTURE     Status: Normal   Collection Time   05/17/12  5:25 PM      Component Value Range Status Comment   Specimen Description URINE, CLEAN CATCH   Final    Special Requests NONE   Final    Culture  Setup Time 05/18/2012 01:23   Final    Colony Count 85,000 COLONIES/ML   Final    Culture PROTEUS MIRABILIS    Final    Report Status 05/20/2012 FINAL   Final    Organism ID, Bacteria PROTEUS MIRABILIS   Final   URINE CULTURE     Status: Normal   Collection Time   05/17/12  5:30 PM      Component Value Range Status Comment   Specimen Description URINE, CLEAN CATCH   Final    Special Requests NONE   Final    Culture  Setup Time 05/18/2012 01:23   Final    Colony Count 75,000 COLONIES/ML   Final    Culture PROTEUS MIRABILIS   Final    Report Status 05/20/2012 FINAL   Final    Organism ID, Bacteria PROTEUS MIRABILIS   Final   MRSA PCR SCREENING     Status: Normal   Collection Time   05/17/12 10:00 PM      Component Value Range Status Comment   MRSA by PCR NEGATIVE  NEGATIVE Final   CULTURE, ROUTINE-ABSCESS     Status: Normal (Preliminary result)   Collection Time   05/18/12  6:33 PM      Component Value Range Status Comment   Specimen Description ABSCESS   Final    Special Requests ASPIRATE FROM RIGHT KIDNEY   Final    Gram Stain     Final    Value: ABUNDANT WBC PRESENT, PREDOMINANTLY PMN     FEW GRAM NEGATIVE RODS   Culture Culture reincubated for better growth   Final    Report Status PENDING   Incomplete      Studies:  Recent x-ray studies have been reviewed in detail by the Attending Physician  Scheduled Meds:  Reviewed in detail by the Attending Physician   Junious Silk, ANP [facility] Office  [phone] Pager [phone]  On-Call/Text Page:      [email or web address]      password [email or web address]  If 7PM-7AM, please contact night-coverage [email or web address] Password [email or web address] 05/20/2012, 11:59 AM   LOS: 3 days   I have personally examined this Rachael Greer and reviewed the entire database. I have reviewed the above note, made any necessary editorial changes, and agree with its content.  Lonia Blood, MD [facility]

## 2012-05-20 NOTE — Progress Notes (Signed)
Physical Therapy Evaluation Patient Details Name: Rachael Greer MRN: [id number] DOB: [birth year] Today's Date: 05/20/2012 Time: 4098-1191 PT Time Calculation (min): 30 min  PT Assessment / Plan / Recommendation Clinical Impression  71 yo female admitted with septic shock; Presents with decr functional mobility; Will benefit form PT services to maximize independence and safety with mobility, and to facilitate dc planning; worth considering postacute rehab as pt must be modified independent to dc home    PT Assessment  Patient needs continued PT services    Follow Up Recommendations  CIR    Does the patient have the potential to tolerate intense rehabilitation      Barriers to Discharge Decreased caregiver support (Can prn assist be arranged for Ms. Lorenson?)      Equipment Recommendations  Rolling walker with 5" wheels    Recommendations for Other Services Rehab consult;OT consult   Frequency Min 3X/week    Precautions / Restrictions Precautions Precautions: Fall   Pertinent Vitals/Pain no apparent distress       Mobility  Bed Mobility Bed Mobility: Rolling Left;Left Sidelying to Sit;Sitting - Scoot to Edge of Bed Rolling Left: 4: Min assist;With rail Left Sidelying to Sit: 3: Mod assist;With rails Sitting - Scoot to Edge of Bed: 4: Min guard;With rail Details for Bed Mobility Assistance: Cues for technique, physical assist to elevate trunk from bed with facilitation at pelvis Transfers Transfers: Sit to Stand;Stand to Sit Sit to Stand: 3: Mod assist;From bed;With upper extremity assist Stand to Sit: 4: Min assist;To chair/3-in-1;With armrests Details for Transfer Assistance: Cues for safe hand placement and technique Ambulation/Gait Ambulation/Gait Assistance: 3: Mod assist Ambulation Distance (Feet): 4 Feet (bed to [facility] to recliner) Assistive device: Rolling walker Ambulation/Gait Assistance Details: Requires steadying assist; one loss of balance to right, with assist to  regain balance; Noted some difficulty with LLE stepping    Shoulder Instructions     Exercises     PT Diagnosis: Difficulty walking;Generalized weakness  PT Problem List: Decreased strength;Decreased activity tolerance;Decreased balance;Decreased mobility;Decreased knowledge of use of DME PT Treatment Interventions: DME instruction;Gait training;Stair training;Functional mobility training;Therapeutic activities;Therapeutic exercise;Balance training;Patient/family education   PT Goals Acute Rehab PT Goals PT Goal Formulation: With patient Time For Goal Achievement: 06/02/12 Potential to Achieve Goals: Good Pt will go Supine/Side to Sit: with modified independence PT Goal: Supine/Side to Sit - Progress: Goal set today Pt will go Sit to Supine/Side: with modified independence PT Goal: Sit to Supine/Side - Progress: Goal set today Pt will go Sit to Stand: with modified independence PT Goal: Sit to Stand - Progress: Goal set today Pt will go Stand to Sit: with modified independence PT Goal: Stand to Sit - Progress: Goal set today Pt will Ambulate: >150 feet;with modified independence;with least restrictive assistive device PT Goal: Ambulate - Progress: Goal set today Pt will Go Up / Down Stairs: 1-2 stairs;with modified independence;with least restrictive assistive device PT Goal: Up/Down Stairs - Progress: Goal set today  Visit Information  Last PT Received On: 05/20/12 Assistance Needed: +1    Subjective Data  Subjective: Agreeable to OOB Patient Stated Goal: get better   Prior Functioning  Home Living Lives With: Alone Available Help at Discharge: Other (Comment) (Will need more info; can prn assist be arranged?) Type of Home:  (townhome) Home Access: Stairs to enter [employer] of Steps: 1 Entrance Stairs-Rails: None Home Layout: One level Home Adaptive Equipment: None Prior Function Level of Independence: Independent Able to Take Stairs?: Yes Driving:  Yes Communication Communication:  No difficulties    Cognition  Overall Cognitive Status: Appears within functional limits for tasks assessed/performed Arousal/Alertness: Awake/alert Orientation Level: Appears intact for tasks assessed Behavior During Session: [facility] for tasks performed    Extremity/Trunk Assessment Right Upper Extremity Assessment RUE ROM/Strength/Tone: [facility] for tasks assessed Left Upper Extremity Assessment LUE ROM/Strength/Tone: WFL for tasks assessed Right Lower Extremity Assessment RLE ROM/Strength/Tone: Deficits RLE ROM/Strength/Tone Deficits: Generally weak, requiring physical assist for sit to stand Left Lower Extremity Assessment LLE ROM/Strength/Tone: Deficits LLE ROM/Strength/Tone Deficits: Generally weak   Balance    End of Session PT - End of Session Activity Tolerance: Patient tolerated treatment well Patient left: in chair;with call bell/phone within reach;with family/visitor present Nurse Communication: Mobility status  GP     Olen Pel [place], [place] [phone]  05/20/2012, 4:47 PM

## 2012-05-20 NOTE — Progress Notes (Signed)
ABX consult:  Urosepsis with proteus. Sens came back this AM. It's pan sensitive. Currently on zosyn/levaquin. D/w Dr. Delton Coombes, de-escalate to rocephin. Needs to complete 14 days of therapy.  Plan  Dc zosyn/levaquin Rocephin 2g IV qday

## 2012-05-20 NOTE — Consult Note (Signed)
PULMONARY  / CRITICAL CARE MEDICINE  Name: Rachael Greer MRN: [id number] DOB: [birth year]    LOS: 3  REFERRING MD :  Dr Butler Denmark  Primary Urologist:  Dr. Eudelia Bunch - [facility]  CHIEF COMPLAINT:  Sepis - urinary source  BRIEF PATIENT DESCRIPTION: 71 y/o F with PMH of Interstitial Cystitis, chronic nitrofurantoin (followed at [facility]) presented 12/6 to [facility] with 3 day history of fever, malaise, nausea, vomiting, found to have UTI. 12/7 decompensated and was tx to ICU.     LINES / TUBES: 12/7 R IJ TLC>>>  CULTURES: 12/6 BCx2>>>proteus>>> sensitive 12/6 UC>>> proteus sensitive except to nitrofurantoin   ANTIBIOTICS: 12/6 Vanco>>>12/7 12/6 Zosyn>>> 12/9 12/9 ceftriaxone >>   SIGNIFICANT EVENTS:  12/6 - Admit, UTI, sepsis 12/7 - decompensated, rigors, mild hypotension, changes in cxr concerning for ARDS 12/7 ECHO>>>EF 60-65%,nml wall motion, Grade 1 diastolic dysfunction, pa peak 31  LEVEL OF CARE:  ICU >> to floor 12/9 PRIMARY SERVICE:  [facility]-->PCCM >> [facility] 12/9 CONSULTANTS:  PCCM CODE STATUS: FULL Code DIET:  regular DVT Px:  heparin GI Px:  protonix   INTERVAL HISTORY:  Off pressors, improved  VITAL SIGNS: Temp:  [96.6 F (35.9 C)-101.5 F (38.6 C)] 98.3 F (36.8 C) (12/09 1100) Pulse Rate:  [84-120] 98  (12/09 1100) Resp:  [16-32] 23  (12/09 1100) BP: (106-135)/(45-76) 106/66 mmHg (12/09 1100) SpO2:  [89 %-100 %] 95 % (12/09 1100) HEMODYNAMICS: CVP:  [8 mmHg-14 mmHg] 9 mmHg VENTILATOR SETTINGS:    INTAKE / OUTPUT: Intake/Output      12/08 0701 - 12/09 0700 12/09 0701 - 12/10 0700   P.O. 1240 240   I.V. (mL/kg) 2442.6 (37.9)    IV Piggyback 152 37.5   Total Intake(mL/kg) 3834.6 (59.5) 277.5 (4.3)   Urine (mL/kg/hr) 4345 (2.8) 600 (2.2)   Stool 1    Total Output 4346 600   Net -511.4 -322.5          PHYSICAL EXAMINATION: General:  wdwn adult female  Neuro:  AAOx4, speech clear, MAE HEENT:  Mm pink/dry, no jvd Cardiovascular:  s1s2 rrr, no m/r/g Lungs:   resp's even/non-labored, diminished lower lateral Abdomen:  Round/soft, flank tenderness on exam Musculoskeletal:  No acute deformities Skin:  no edema, no lesions.    LABS: Cbc  Lab 05/20/12 0445 05/19/12 0810 05/18/12 1728  WBC 12.7* -- --  HGB 8.6* 8.9* 8.0*  HCT 25.6* 27.2* 24.1*  PLT 181 179 141*   Chemistry  Lab 05/20/12 0445 05/19/12 0810 05/18/12 1728 05/18/12 1021  NA 137 140 141 --  K 3.2* 3.2* 3.0* --  CL 109 111 114* --  CO2 20 17* 18* --  BUN 16 17 19  --  CREATININE 1.05 1.15* 1.45* --  CALCIUM 8.5 8.3* 7.8* --  MG -- -- 1.4* 1.5  PHOS -- -- -- --  GLUCOSE 90 102* 96 --   Liver fxn  Lab 05/18/12 1728 05/18/12 0600 05/17/12 1514  AST 71* 68* 46*  ALT 48* 44* 45*  ALKPHOS 210* 89 97  BILITOT 0.7 0.8 0.8  PROT 5.1* 4.8* 7.3  ALBUMIN 2.0* 1.8* 3.0*   coags  Lab 05/18/12 1312  APTT 35  INR 1.28   Sepsis markers  Lab 05/20/12 0445 05/18/12 1041 05/18/12 1021 05/18/12 0600 05/17/12 1518  LATICACIDVEN -- 3.4* -- 1.2 2.2  PROCALCITON 121.44 -- >175.00 -- --   Cardiac markers  Lab 05/19/12 0052 05/18/12 1725 05/18/12 1540 05/18/12 1021 05/17/12 1514  CKTOTAL -- -- --  1690* 201*  CKMB -- -- -- 5.4* 1.2  TROPONINI <0.30 0.73* 0.71* -- --   BNP No results found for this basename: PROBNP:3 in the last 168 hours ABG No results found for this basename: PHART:3,PCO2ART:3,PO2ART:3,HCO3:3,TCO2:3 in the last 168 hours  CBG trend  Lab 05/18/12 1505 05/18/12 1131  GLUCAP 98 126*    IMAGING:  12/7 CXR>>>bilateral airspace disease 12/7 CT ABD / PELVIS>>>New bilateral staghorn calculi - likely infected given clinical history. Mild right hydronephrosis, right renal enlargement and perinephric inflammation may be related to obstruction by the right renal pelvic staghorn calculus or secondary changes from infection/pyelonephritis. No gross focal abscess is identified. Small amount of free pelvic fluid - nonspecific. Nonspecific gallbladder wall thickening. If  there is strong clinical suspicion for cholecystitis, consider nuclear medicine study. New moderate bibasilar consolidation/atelectasis with small bilateral pleural effusions.   ECG:  DIAGNOSES: Principal Problem:  *Septic shock(785.52) Active Problems:  Urosepsis  Hypokalemia  Acute on chronic renal failure  Transaminitis  Leukocytosis  Hyponatremia  Microcytic anemia   ASSESSMENT / PLAN:  PULMONARY  ASSESSMENT: Acute Respiratory Distress  - in setting of bilateral airspace disease associated with sepsis / volume resuscitation and NSTEMI.  Consider ALI/ARDS vs cardiogenic edema  PLAN:   -O2 to keep sats >92% -IS / Pulmonary hygiene -f/u cxr intermittently  CARDIOVASCULAR  ASSESSMENT:  NSTEMI - in setting of sepsis, volume resuscitation  PLAN:  -asa -cycle enzymes, peaked -ECHO reviewed -cxr changes most likely ALI, improved   RENAL  ASSESSMENT:   Bilateral Renal Calculi - bilateral staghorn renal calculi, obstruction on R with Mild right hydronephrosis, right renal enlargement and perinephric inflammation  Hypokalemia  PLAN:   -Perc drain per IR -Discussed with Dr. Isabel Caprice 12/7, rec's perc drain as above.  Recommends she potentially follow up with [facility] as outpatient with drain vs. Transfer to [facility] for intervention.  May need to call on Monday to re-eval with Urology.  -replace K  GASTROINTESTINAL  ASSESSMENT:   Nausea   PLAN:   -PRN zofran  HEMATOLOGIC  ASSESSMENT:   Anemia  -component of dilution with >6L volume resuscitation  PLAN:  -no tx unless hgb <7%, active bleeding   INFECTIOUS  ASSESSMENT:   Pyelonephritis Chronic Interstitial Cystitis - on nitrofurantion as prophylaxis  PLAN:   -zosyn >> change to ceftriaxone 12/9 as proteus is sensitive - note that the proteus is resistant to nitrofurantoin (on for chronic interstitial cystitis)  ENDOCRINE  ASSESSMENT:   No acute issues  PLAN:     NEUROLOGIC  ASSESSMENT:   Pain  -  in setting of bilateral renal stones  PLAN:   -PRN morphine, vicodin   I have personally obtained a history, examined the patient, evaluated laboratory and imaging results, formulated the assessment and plan and placed orders.   Levy Pupa, MD, PhD 05/20/2012, 11:13 AM [facility] [phone] or if no answer [phone]

## 2012-05-20 NOTE — Care Management Note (Signed)
    Page 1 of 1   05/23/2012     2:02:17 PM   CARE MANAGEMENT NOTE 05/23/2012  Patient:  Rachael Greer, Rachael Greer   Account Number:  [id number]  Date Initiated:  05/20/2012  Documentation initiated by:  Letha Cape  Subjective/Objective Assessment:   dx septic  admit- lives alone.     Action/Plan:   pt eval- recs CIR, will need 24 hr care,will see how she progresses with pt   Anticipated DC Date:  05/23/2012   Anticipated DC Plan:  SKILLED NURSING FACILITY      DC Planning Services  CM consult      Choice offered to / List presented to:             Status of service:  Completed, signed off Medicare Important Message given?   (If response is "NO", the following Medicare IM given date fields will be blank) Date Medicare IM given:   Date Additional Medicare IM given:    Discharge Disposition:  SKILLED NURSING FACILITY  Per UR Regulation:  Reviewed for med. necessity/level of care/duration of stay  If discussed at Long Length of Stay Meetings, dates discussed:    Comments:  05/23/12 14:00 Letha Cape RN, BSN [phone] patient is for dc today to snf, CSW followiing.  05/20/12 17:09 Letha Cape RN, BSN [phone] patient lives alone, per physical therpay recs CIR, informed MD to put in inpt rehab consult.  NCM will continue to follow for dc needs.

## 2012-05-20 NOTE — Progress Notes (Signed)
Subjective: Rt Perc nephrostomy placed 12/7 (renal calculus) Pt feels better Output good  Objective: Vital signs in last 24 hours: Temp:  [96.6 F (35.9 C)-101.5 F (38.6 C)] 98.7 F (37.1 C) (12/09 0747) Pulse Rate:  [84-120] 91  (12/09 0700) Resp:  [16-32] 18  (12/09 0700) BP: (108-144)/(45-79) 117/61 mmHg (12/09 0700) SpO2:  [89 %-100 %] 96 % (12/09 0700) Last BM Date: 05/19/12  Intake/Output from previous day: 12/08 0701 - 12/09 0700 In: 3834.6 [P.O.:1240; I.V.:2442.6; IV Piggyback:152] Out: 4346 [Urine:4345; Stool:1] Intake/Output this shift:    PE:  Afeb; VSS R PCN intact No bleeding; clean and dry Output 560 cc 12/8 - clear and yellow Minimal in bag now - clear BUN/Cr stable Wbc 12.7 (17.9)   Lab Results:   [facility] 05/20/12 0445 05/19/12 0810  WBC 12.7* 17.9*  HGB 8.6* 8.9*  HCT 25.6* 27.2*  PLT 181 179   BMET  Basename 05/20/12 0445 05/19/12 0810  NA 137 140  K 3.2* 3.2*  CL 109 111  CO2 20 17*  GLUCOSE 90 102*  BUN 16 17  CREATININE 1.05 1.15*  CALCIUM 8.5 8.3*   PT/INR  Basename 05/18/12 1312  LABPROT 15.7*  INR 1.28   ABG No results found for this basename: PHART:2,PCO2:2,PO2:2,HCO3:2 in the last 72 hours  Studies/Results: Ct Abdomen Pelvis Wo Contrast  05/18/2012  *RADIOLOGY REPORT*  Clinical Data: 71 year old female with abdominal pelvic pain, chronic UTI and pyelonephritis.  CT ABDOMEN AND PELVIS WITHOUT CONTRAST  Technique:  Multidetector CT imaging of the abdomen and pelvis was performed following the standard protocol without intravenous contrast.  Comparison: 04/13/2010 CT  Findings: Moderate bibasilar atelectasis/consolidation with small bilateral pleural effusions noted.  New staghorn calculi are present within the right renal pelvis extending towards the UPJ, posterior right upper pole and posterior left upper pole. The right kidney is enlarged with perinephric inflammation and mild right hydronephrosis. No gross abscess is  identified. A 3 mm nonobstructing calculus within the left lower pole is present.  Gallbladder wall thickening is noted - nonspecific.  The liver, spleen, adrenal glands and pancreas are unremarkable.  A small amount of free fluid within the pelvis is identified. A Foley catheter in the bladder is identified. The bowel is within normal limits.  No acute or suspicious bony abnormalities are present.  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  Mild right hydronephrosis, right renal enlargement and perinephric inflammation may be related to obstruction by the right renal pelvic staghorn calculus or secondary changes from infection/pyelonephritis.  No gross focal abscess is identified.  Small amount of free pelvic fluid - nonspecific.  Nonspecific gallbladder wall thickening.  If there is strong clinical suspicion for cholecystitis, consider nuclear medicine study.  New moderate bibasilar consolidation/atelectasis with small bilateral pleural effusions.   Original Report Authenticated By: Harmon Pier, M.D.    Ir Perc Nephrostomy Right  05/18/2012  *RADIOLOGY REPORT*  Clinical history:71 year old with urosepsis.  The patient has bilateral kidney stones and mild dilatation of right renal collecting system. There is inflammation associated with the right kidney.  PROCEDURE(S): ULTRASOUND AND FLUOROSCOPIC GUIDED RIGHT PERCUTANEOUS NEPHROSTOMY TUBE PLACEMENT  Physician: Rachelle Hora. Lowella Dandy, MD  Medications:Fentanyl 25 mcg.  Fluoroscopy time: 2.7 minutes  Procedure:The procedure was explained to the patient.  The risks and benefits of the procedure were discussed and the patient's questions were addressed.  Informed consent was obtained from the patient.  The patient was placed prone.  The right flank was prepped and  draped in a sterile fashion.  Maximal barrier sterile technique was utilized including caps, mask, sterile gowns, sterile gloves, sterile drape, hand hygiene and skin  antiseptic.  The skin was anesthetized with lidocaine.  Using ultrasound guidance, a 21 gauge needle was directed into a mildly dilated lower pole calix. 0.018 wire was advanced into the collecting system.  An Accustick dilator set was placed.  A Kumpe catheter was advanced into the renal pelvis.  Small amount of contrast was injected to confirm placement within the collecting system.  A J wire was placed.  The tract was dilated and a 10-French Renee Pain catheter was placed within the upper aspect of the renal pelvis.  Catheter was sutured to the skin and attached to a gravity bag.  Approximately 5 ml of bloody purulent fluid was aspirated from the collecting system.  Findings:There are multiple stones within the right kidney.  There is a large staghorn calculus involving the renal pelvis.  A small amount of purulent fluid was aspirated from the renal collecting system.  Complications: None  Impression:Successful placement of a right percutaneous nephrostomy tube.  Small amount of bloody purulent fluid was aspirated from the collecting system and findings are suggestive for pyonephrosis. Fluid was sent for Gram stain and culture.   Original Report Authenticated By: Richarda Overlie, M.D.    Ir US Guide Bx Asp/drain  05/18/2012  *RADIOLOGY REPORT*  Clinical history:71 year old with urosepsis.  The patient has bilateral kidney stones and mild dilatation of right renal collecting system. There is inflammation associated with the right kidney.  PROCEDURE(S): ULTRASOUND AND FLUOROSCOPIC GUIDED RIGHT PERCUTANEOUS NEPHROSTOMY TUBE PLACEMENT  Physician: Rachelle Hora. Lowella Dandy, MD  Medications:Fentanyl 25 mcg.  Fluoroscopy time: 2.7 minutes  Procedure:The procedure was explained to the patient.  The risks and benefits of the procedure were discussed and the patient's questions were addressed.  Informed consent was obtained from the patient.  The patient was placed prone.  The right flank was prepped and draped in a sterile fashion.   Maximal barrier sterile technique was utilized including caps, mask, sterile gowns, sterile gloves, sterile drape, hand hygiene and skin antiseptic.  The skin was anesthetized with lidocaine.  Using ultrasound guidance, a 21 gauge needle was directed into a mildly dilated lower pole calix. 0.018 wire was advanced into the collecting system.  An Accustick dilator set was placed.  A Kumpe catheter was advanced into the renal pelvis.  Small amount of contrast was injected to confirm placement within the collecting system.  A J wire was placed.  The tract was dilated and a 10-French Renee Pain catheter was placed within the upper aspect of the renal pelvis.  Catheter was sutured to the skin and attached to a gravity bag.  Approximately 5 ml of bloody purulent fluid was aspirated from the collecting system.  Findings:There are multiple stones within the right kidney.  There is a large staghorn calculus involving the renal pelvis.  A small amount of purulent fluid was aspirated from the renal collecting system.  Complications: None  Impression:Successful placement of a right percutaneous nephrostomy tube.  Small amount of bloody purulent fluid was aspirated from the collecting system and findings are suggestive for pyonephrosis. Fluid was sent for Gram stain and culture.   Original Report Authenticated By: Richarda Overlie, M.D.    Dg Chest Port 1 View  05/20/2012  *RADIOLOGY REPORT*  Clinical Data: Shortness of breath.  Follow-up airspace disease.  PORTABLE CHEST - 1 VIEW  Comparison: 05/19/2012  Findings: Normal heart  size and pulmonary vascularity.  Bilateral perihilar and lower lobe infiltrates.  No significant change since previous study.  Mild blunting of costophrenic angles suggest small effusions.  No pneumothorax.  Right central venous catheter is unchanged in position.  IMPRESSION: Stable appearance of bilateral pulmonary infiltrates.  Small pleural effusions bilaterally.   Original Report Authenticated By:  Burman Nieves, M.D.    Dg Chest Port 1 View  05/19/2012  *RADIOLOGY REPORT*  Clinical Data: Wheezing.  Chest pain with deep breathing.  Fever.  PORTABLE CHEST - 1 VIEW  Comparison: 05/18/2012  Findings: Right lung base airspace opacity has increased.  There is persistent left perihilar left medial lung base opacity, which may also be mildly increased.  The right internal jugular central venous line is stable well- positioned.  The heart is normal in size.  The mediastinum is normal in contour caliber.  No pneumothorax.  IMPRESSION: There has been an interval increase in lung base opacity most evident on the right.  This supports bilateral pneumonia as the cause of these airspace opacities.   Original Report Authenticated By: Amie Portland, M.D.    Dg Chest Port 1 View  05/18/2012  *RADIOLOGY REPORT*  Clinical Data: 70 year old female with sepsis and fever.  Right central venous catheter placement.  PORTABLE CHEST - 1 VIEW  Comparison: Film earlier this date.  Findings: The cardiomediastinal silhouette is stable. A right IJ central venous catheter has been placed with tip overlying the mid SVC. There is no evidence of pneumothorax. Mild bibasilar opacities, right greater than left are noted may represent atelectasis or pneumonia. There is no evidence of pleural effusion.  IMPRESSION: Right IJ central venous catheter placement with tip overlying the mid SVC - no evidence of pneumothorax.  Persistent bibasilar opacities, right greater than left - question atelectasis versus pneumonia.   Original Report Authenticated By: Harmon Pier, M.D.     Anti-infectives:   Assessment/Plan: s/p R PCN placed 12/7 Hydronephrosis; Rt renal calculus)  PCN intact; doing well Plan per Uro   LOS: 3 days    Christophor Eick A 05/20/2012

## 2012-05-21 DIAGNOSIS — D649 Anemia, unspecified: Secondary | ICD-10-CM

## 2012-05-21 LAB — BASIC METABOLIC PANEL
CO2: 24 mEq/L (ref 19–32)
Calcium: 8.5 mg/dL (ref 8.4–10.5)
GFR calc non Af Amer: 55 mL/min — ABNORMAL LOW (ref >90)
Potassium: 3.2 mEq/L — ABNORMAL LOW (ref 3.5–5.1)
Sodium: 137 mEq/L (ref 135–145)

## 2012-05-21 LAB — HEPATIC FUNCTION PANEL
AST: 59 U/L — ABNORMAL HIGH (ref 0–37)
Albumin: 1.8 g/dL — ABNORMAL LOW (ref 3.5–5.2)
Alkaline Phosphatase: 250 U/L — ABNORMAL HIGH (ref 39–117)
Total Protein: 5.3 g/dL — ABNORMAL LOW (ref 6.0–8.3)

## 2012-05-21 LAB — IRON AND TIBC
Saturation Ratios: 9 % — ABNORMAL LOW (ref 20–55)
TIBC: 187 ug/dL — ABNORMAL LOW (ref 250–470)
UIBC: 170 ug/dL (ref 125–400)

## 2012-05-21 LAB — VITAMIN B12: Vitamin B-12: 1128 pg/mL — ABNORMAL HIGH (ref 211–911)

## 2012-05-21 LAB — FOLATE: Folate: 6.1 ng/mL

## 2012-05-21 NOTE — Progress Notes (Signed)
IV Team Notified to d/c PICC

## 2012-05-21 NOTE — Progress Notes (Signed)
D/c O2 sats 93 on RA. Patient OOB to chair.

## 2012-05-21 NOTE — Progress Notes (Addendum)
Clinical Social Work Department CLINICAL SOCIAL WORK PLACEMENT NOTE 05/21/2012  Patient:  Rachael Greer  Account Number:  [id number] Admit date:  04/17/2012  Clinical Social Worker:  Robin Searing  Date/time:  05/21/2012 10:45 AM  Clinical Social Work is seeking post-discharge placement for this patient at the following level of care:   SKILLED NURSING   (*CSW will update this form in Epic as items are completed)   05/21/2012  Patient/family provided with [facility] Department of Clinical Social Work's list of facilities offering this level of care within the geographic area requested by the patient (or if unable, by the patient's family).  05/21/2012  Patient/family informed of their freedom to choose among providers that offer the needed level of care, that participate in Medicare, Medicaid or managed care program needed by the patient, have an available bed and are willing to accept the patient.  05/21/2012  Patient/family informed of [facility]' ownership interest in [facility], as well as of the fact that they are under no obligation to receive care at this facility.  PASARR submitted to [employer] on 05/21/2012 PASARR number received from [employer] on 05/22/2012  FL2 transmitted to all facilities in geographic area requested by pt/family on  05/21/2012 FL2 transmitted to all facilities within larger geographic area on   Patient informed that his/her managed care company has contracts with or will negotiate with  certain facilities, including the following:     Patient/family informed of bed offers received:  05/23/2012 Patient chooses bed at [facility] Physician recommends and patient chooses bed at    Patient to be transferred to [facility] on  05/23/2012  Patient to be transferred to facility by  [facility] (with sonSusy Frizzle)- per patient/family choice The following physician request were entered in Epic:   Additional Comments: Patient and son are pleased with d/c  plan. Son signed admit papers at facility and then came and picked up patient. Her 02 Sats were checked by nursing prior this am with sats of 93-94%. Patient denied any shortness of breath. She was encouraged to notify nursing staff should she feel any shortness of breath.  Discussed with pt's nurse- Diane.  Lorri Frederick. [place]  [phone]      Reece Levy, MSW, LCSWA [phone]  1

## 2012-05-21 NOTE — Progress Notes (Signed)
  Subjective: Rt PCN placed 12/7 Pt up in chair; feels better   Objective: Vital signs in last 24 hours: Temp:  [98.3 F (36.8 C)-98.9 F (37.2 C)] 98.5 F (36.9 C) (12/10 0501) Pulse Rate:  [98-102] 102  (12/10 0501) Resp:  [16-23] 18  (12/10 0501) BP: (106-129)/(66-72) 118/72 mmHg (12/10 0501) SpO2:  [90 %-95 %] 90 % (12/10 0501) Last BM Date: 05/20/12  Intake/Output from previous day: 12/09 0701 - 12/10 0700 In: 387.5 [P.O.:240; IV Piggyback:37.5] Out: 3196 [Urine:3195; Stool:1] Intake/Output this shift: Total I/O In: 240 [P.O.:240] Out: -   PE:  Afeb; vss R PCN output 670 cc 12/9 50 cc in bag now; clear yellow Site clean and dry; tender No bleeding  Lab Results:   [facility] 05/20/12 0445 05/19/12 0810  WBC 12.7* 17.9*  HGB 8.6* 8.9*  HCT 25.6* 27.2*  PLT 181 179   BMET  Basename 05/21/12 0625 05/20/12 0445  NA 137 137  K 3.2* 3.2*  CL 105 109  CO2 24 20  GLUCOSE 100* 90  BUN 14 16  CREATININE 1.00 1.05  CALCIUM 8.5 8.5   PT/INR  Basename 05/18/12 1312  LABPROT 15.7*  INR 1.28   ABG No results found for this basename: PHART:2,PCO2:2,PO2:2,HCO3:2 in the last 72 hours  Studies/Results: Dg Chest Port 1 View  05/20/2012  *RADIOLOGY REPORT*  Clinical Data: Shortness of breath.  Follow-up airspace disease.  PORTABLE CHEST - 1 VIEW  Comparison: 05/19/2012  Findings: Normal heart size and pulmonary vascularity.  Bilateral perihilar and lower lobe infiltrates.  No significant change since previous study.  Mild blunting of costophrenic angles suggest small effusions.  No pneumothorax.  Right central venous catheter is unchanged in position.  IMPRESSION: Stable appearance of bilateral pulmonary infiltrates.  Small pleural effusions bilaterally.   Original Report Authenticated By: Burman Nieves, M.D.     Anti-infectives:   Assessment/Plan: s/p Rt Perc nephrostomy drain placed 12/7 Good output Plan per [facility] ? Uro   LOS: 4 days    Mindie Rawdon  A 05/21/2012

## 2012-05-21 NOTE — Clinical Social Work Psychosocial (Signed)
     Clinical Social Work Department BRIEF PSYCHOSOCIAL ASSESSMENT 05/21/2012  Patient:  Rachael Greer, Rachael Greer     Account Number:  [id number]     Admit date:  05/17/2012  Clinical Social Worker:  Robin Searing  Date/Time:  05/21/2012 10:40 AM  Referred by:  Physician  Date Referred:  05/21/2012 Referred for  SNF Placement   Other Referral:   Interview type:  Other - See comment Other interview type:   met with patient and her son and friend- Don    PSYCHOSOCIAL DATA Living Status:  ALONE Admitted from facility:   Level of care:   Primary support name:  Don Brown/friend Primary support relationship to patient:  FRIEND Degree of support available:   good    CURRENT CONCERNS Current Concerns  Post-Acute Placement   Other Concerns:    SOCIAL WORK ASSESSMENT / PLAN Patient and family are agreeable to SNF placement at d/c- patient resides alone and feels this would be beneficial prior to her return to her home.   Assessment/plan status:  Other - See comment Other assessment/ plan:   FL2 and PASARR completion for SNF   Information/referral to community resources:   SNF  EMS    PATIENTS/FAMILYS RESPONSE TO PLAN OF CARE: Patient and family/friend are agreeable to this SNF- will proceed with SNF and advise

## 2012-05-21 NOTE — Progress Notes (Signed)
Physical Therapy Treatment Patient Details Name: Rachael Greer MRN: [id number] DOB: 1941-03-01 Today's Date: 05/21/2012 Time: 5366-4403 PT Time Calculation (min): 28 min  PT Assessment / Plan / Recommendation Comments on Treatment Session  Pt adm with septic shock, acute resp failure, UTI, hydonephrosis (nephrostomy tube placed).  Pt making good progress with mobility.    Follow Up Recommendations  SNF     Does the patient have the potential to tolerate intense rehabilitation     Barriers to Discharge        Equipment Recommendations  Rolling walker with 5" wheels    Recommendations for Other Services    Frequency Min 3X/week   Plan Frequency remains appropriate;Discharge plan needs to be updated    Precautions / Restrictions Precautions Precautions: Fall   Pertinent Vitals/Pain SaO2 85% on RA with amb    Mobility  Transfers Sit to Stand: 4: Min assist;With upper extremity assist;With armrests;From chair/3-in-1 Stand to Sit: 4: Min assist;With upper extremity assist;With armrests;To chair/3-in-1 Details for Transfer Assistance: verbal cues for hand placement Ambulation/Gait Ambulation/Gait Assistance: 4: Min assist Ambulation Distance (Feet): 120 Feet Assistive device: Rolling walker Ambulation/Gait Assistance Details: Verbal cues to stand more erect. Gait Pattern: Step-through pattern;Decreased stride length;Trunk flexed    Exercises General Exercises - Lower Extremity Long Arc Quad: Strengthening;Both;15 reps;Seated Hip Flexion/Marching: Strengthening;Both;10 reps;Seated   PT Diagnosis:    PT Problem List:   PT Treatment Interventions:     PT Goals Acute Rehab PT Goals PT Goal: Sit to Stand - Progress: Progressing toward goal PT Goal: Stand to Sit - Progress: Progressing toward goal PT Goal: Ambulate - Progress: Progressing toward goal  Visit Information  Last PT Received On: 05/21/12 Assistance Needed: +1    Subjective Data  Subjective: Pt stating she  wishes her foley could be taken out.   Cognition  Overall Cognitive Status: Appears within functional limits for tasks assessed/performed Arousal/Alertness: Awake/alert Orientation Level: Appears intact for tasks assessed Behavior During Session: [facility] for tasks performed    Balance  Static Standing Balance Static Standing - Balance Support: Bilateral upper extremity supported (on walker) Static Standing - Level of Assistance: 4: Min assist  End of Session PT - End of Session Equipment Utilized During Treatment: Gait belt Activity Tolerance: Patient tolerated treatment well Patient left: in chair;with call bell/phone within reach;with family/visitor present Nurse Communication: Mobility status   GP     Tomeshia Pizzi 05/21/2012, 2:12 PM  [employer] PT [phone]

## 2012-05-21 NOTE — Plan of Care (Signed)
Problem: Phase II Progression Outcomes Goal: Discharge plan established Outcome: Progressing Patient ambulated with PT down hallways and was OOB to chair today

## 2012-05-21 NOTE — Progress Notes (Signed)
PATIENT DETAILS Name: Rachael Greer Age: 71 y.o. Sex: female Date of Birth: 11-23-1940 Admit Date: 05/17/2012 Admitting Physician Zannie Cove, MD PCP:No primary provider on file.  Brief narrative:  71 y/o F with PMH of Interstitial Cystitis, chronic nitrofurantoin (followed at [facility]) presented 12/6 to [facility] with 3 day history of fever, malaise, nausea, vomiting, found to have UTI. 12/7 decompensated from a respiratory standpoint and was tx to ICU. Clinical exam and chest x-ray findings consistent with acute lung injury. Did not require intubation. Cardiac markers were also slightly elevated consistent with demand ischemia. Echocardiogram showed no regional wall motion abnormalities and EKG showed no ischemic changes. Urine culture revealed Proteus urinary tract infection with associated bacteremia. CT scan revealed bilateral renal calculi with right hydronephrosis and she is status post placement of a percutaneous nephrostomy tube. She stabilized from a respiratory and hemodynamic standpoint and was transferred to step down. Team 1 assumed care of this patient on 05/20/2012 and transferred the patient to regular medical bed.   Subjective: No major complaints  Assessment/Plan: Principal Problem:  Septic shock due to:  A) UTI (lower urinary tract infection) 2/2 Proteus  B) Bacteremia 2/2 UTI  -c/w antibiotic coverage continues with Rocephin - Will need to treat for at least 2 weeks - Afebrile, mild leukocytosis but down turning compared to the past few days. Pro calcitonin level still high we'll repeat in the next few days  Acute respiratory failure with hypoxia 2/2 ALI  -Continues to require low flow oxygen and hopefully can wean  -Antibiotics  as above  -Pulmonary medicine has signed off - Chest x-ray done on 12/9 still showed significant bilateral infiltrates-will repeat x-ray on 12/11  Acute on chronic renal failure  -Resolved  Right Hydronephrosis with ureteral  calculus/bilateral ureteral calculi  -Critical care medicine spoke with Dr. Esmond Camper and patient subsequently underwent percutaneous nephrostomy tube placement per interventional radiology  -Dr. Sharon Seller contacted Urologist on call 05/20/2012 and recommendations are to leave the nephrostomy tube in place until the patient can followup with her primary urologist Dr. Logan Bores at [facility] after discharge - she will remain on abx   Demand ischemia -Only mild elevation troponin without EKG changes  -no focal regional wall motion abnormalities on echo so suspect etiology from hypotension and septic shock - Maintained on aspirin  Hyponatremia - Resolved - Monitor electrolytes periodically  Hypokalemia - Will repeat and repeat electrolytes in the morning  Anemia - Likely secondary to critical illness - Monitor CBC - Check anemia panel  Interstitial cystitis  -Continue Elmiron  -Would not continue Macrodantin after discharged since current urinary organism is resistant and likely most organisms have developed resistance to this chronic medication - also, this drug carries a risk of pulmonary fibrosis   Disposition: Remain inpatient- SNF on discharge  DVT Prophylaxis: Prophylactic  Heparin  Code Status: Full code or DNR  Procedures:  Percutaneous nephrostomy tube placement-12/7  Right IJ TLC  CONSULTS:  pulmonary/intensive care and urology  PHYSICAL EXAM: Vital signs in last 24 hours: Filed Vitals:   05/20/12 2100 05/21/12 0501 05/21/12 1135 05/21/12 1136  BP: 129/70 118/72    Pulse: 101 102    Temp: 98.9 F (37.2 C) 98.5 F (36.9 C)    TempSrc: Oral Oral    Resp: 16 18    Height:      Weight:      SpO2: 94% 90% 85% 90%    Weight change:  Body mass index is 25.15 kg/(m^2).   Gen  Exam: Awake and alert with clear speech.   Neck: Supple, No JVD.  IJ in place Chest: B/L Clear.   CVS: S1 S2 Regular, no murmurs.  Abdomen: soft, BS +, non tender, non  distended. Right percutaneous nephrostomy in place Extremities: no edema, lower extremities warm to touch. Neurologic: Non Focal.   Skin: No Rash.   Wounds: N/A.    Intake/Output from previous day:  Intake/Output Summary (Last 24 hours) at 05/21/12 1500 Last data filed at 05/21/12 1058  Gross per 24 hour  Intake    353 ml  Output   1615 ml  Net  -1262 ml     LAB RESULTS: CBC  Lab 05/20/12 0445 05/19/12 0810 05/18/12 1728 05/18/12 0600 05/17/12 1514  WBC 12.7* 17.9* 12.2* 11.4* 14.3*  HGB 8.6* 8.9* 8.0* 7.6* 10.9*  HCT 25.6* 27.2* 24.1* 23.7* 32.3*  PLT 181 179 141* 151 246  MCV 75.3* 76.2* 77.0* 77.7* 75.5*  MCH 25.3* 24.9* 25.6* 24.9* 25.5*  MCHC 33.6 32.7 33.2 32.1 33.7  RDW 15.0 14.6 14.5 14.3 13.8  LYMPHSABS -- -- -- -- 0.4*  MONOABS -- -- -- -- 0.6  EOSABS -- -- -- -- 0.0  BASOSABS -- -- -- -- 0.0  BANDABS -- -- -- -- --    Chemistries   Lab 05/21/12 0625 05/20/12 0445 05/19/12 0810 05/18/12 1728 05/18/12 1021 05/18/12 0600  NA 137 137 140 141 -- 139  K 3.2* 3.2* 3.2* 3.0* -- 3.2*  CL 105 109 111 114* -- 113*  CO2 24 20 17* 18* -- 17*  GLUCOSE 100* 90 102* 96 -- 103*  BUN 14 16 17 19  -- 15  CREATININE 1.00 1.05 1.15* 1.45* -- 1.42*  CALCIUM 8.5 8.5 8.3* 7.8* -- 7.4*  MG -- -- -- 1.4* 1.5 --    CBG:  Lab 05/18/12 1505 05/18/12 1131  GLUCAP 98 126*    GFR Estimated Creatinine Clearance: 46.6 ml/min (by C-G formula based on Cr of 1).  Coagulation profile  Lab 05/18/12 1312  INR 1.28  PROTIME --    Cardiac Enzymes  Lab 05/19/12 0052 05/18/12 1725 05/18/12 1540 05/18/12 1021 05/17/12 1514  CKMB -- -- -- 5.4* 1.2  TROPONINI <0.30 0.73* 0.71* -- --  MYOGLOBIN -- -- -- -- --    No components found with this basename: POCBNP:3 No results found for this basename: DDIMER:2 in the last 72 hours No results found for this basename: HGBA1C:2 in the last 72 hours No results found for this basename: CHOL:2,HDL:2,LDLCALC:2,TRIG:2,CHOLHDL:2,LDLDIRECT:2  in the last 72 hours No results found for this basename: TSH,T4TOTAL,FREET3,T3FREE,THYROIDAB in the last 72 hours No results found for this basename: VITAMINB12:2,FOLATE:2,FERRITIN:2,TIBC:2,IRON:2,RETICCTPCT:2 in the last 72 hours No results found for this basename: LIPASE:2,AMYLASE:2 in the last 72 hours  Urine Studies No results found for this basename: UACOL:2,UAPR:2,USPG:2,UPH:2,UTP:2,UGL:2,UKET:2,UBIL:2,UHGB:2,UNIT:2,UROB:2,ULEU:2,UEPI:2,UWBC:2,URBC:2,UBAC:2,CAST:2,CRYS:2,UCOM:2,BILUA:2 in the last 72 hours  MICROBIOLOGY: Recent Results (from the past 240 hour(s))  CULTURE, BLOOD (ROUTINE X 2)     Status: Normal   Collection Time   05/17/12  3:30 PM      Component Value Range Status Comment   Specimen Description BLOOD RIGHT THUMB   Final    Special Requests BOTTLES DRAWN AEROBIC AND ANAEROBIC [facility] EACH   Final    Culture  Setup Time 05/18/2012 00:54   Final    Culture     Final    Value: PROTEUS MIRABILIS     Note: Gram Stain Report Called to,Read Back By and Verified With: SARA GROCE 05/18/12 @  1:20PM BY RUSCA.   Report Status 05/20/2012 FINAL   Final    Organism ID, Bacteria PROTEUS MIRABILIS   Final   CULTURE, BLOOD (ROUTINE X 2)     Status: Normal   Collection Time   05/17/12  3:43 PM      Component Value Range Status Comment   Specimen Description BLOOD RIGHT HAND   Final    Special Requests BOTTLES DRAWN AEROBIC AND ANAEROBIC 5CC EACH   Final    Culture  Setup Time 05/18/2012 00:54   Final    Culture     Final    Value: PROTEUS MIRABILIS     Note: SUSCEPTIBILITIES PERFORMED ON PREVIOUS CULTURE WITHIN THE LAST 5 DAYS.     Note: Gram Stain Report Called to,Read Back By and Verified With: [facility] WHITE 05/18/12 @ 9:05PM BY RUSCA.   Report Status 05/20/2012 FINAL   Final   URINE CULTURE     Status: Normal   Collection Time   05/17/12  5:25 PM      Component Value Range Status Comment   Specimen Description URINE, CLEAN CATCH   Final    Special Requests NONE   Final     Culture  Setup Time 05/18/2012 01:23   Final    Colony Count 85,000 COLONIES/ML   Final    Culture PROTEUS MIRABILIS   Final    Report Status 05/20/2012 FINAL   Final    Organism ID, Bacteria PROTEUS MIRABILIS   Final   URINE CULTURE     Status: Normal   Collection Time   05/17/12  5:30 PM      Component Value Range Status Comment   Specimen Description URINE, CLEAN CATCH   Final    Special Requests NONE   Final    Culture  Setup Time 05/18/2012 01:23   Final    Colony Count 75,000 COLONIES/ML   Final    Culture PROTEUS MIRABILIS   Final    Report Status 05/20/2012 FINAL   Final    Organism ID, Bacteria PROTEUS MIRABILIS   Final   MRSA PCR SCREENING     Status: Normal   Collection Time   05/17/12 10:00 PM      Component Value Range Status Comment   MRSA by PCR NEGATIVE  NEGATIVE Final   CULTURE, ROUTINE-ABSCESS     Status: Normal (Preliminary result)   Collection Time   05/18/12  6:33 PM      Component Value Range Status Comment   Specimen Description ABSCESS   Final    Special Requests ASPIRATE FROM RIGHT KIDNEY   Final    Gram Stain     Final    Value: ABUNDANT WBC PRESENT, PREDOMINANTLY PMN     FEW GRAM NEGATIVE RODS   Culture Culture reincubated for better growth   Final    Report Status PENDING   Incomplete     RADIOLOGY STUDIES/RESULTS: Ct Abdomen Pelvis Wo Contrast  05/18/2012  *RADIOLOGY REPORT*  Clinical Data: 71 year old female with abdominal pelvic pain, chronic UTI and pyelonephritis.  CT ABDOMEN AND PELVIS WITHOUT CONTRAST  Technique:  Multidetector CT imaging of the abdomen and pelvis was performed following the standard protocol without intravenous contrast.  Comparison: 04/13/2010 CT  Findings: Moderate bibasilar atelectasis/consolidation with small bilateral pleural effusions noted.  New staghorn calculi are present within the right renal pelvis extending towards the UPJ, posterior right upper pole and posterior left upper pole. The right kidney is enlarged with  perinephric inflammation and  mild right hydronephrosis. No gross abscess is identified. A 3 mm nonobstructing calculus within the left lower pole is present.  Gallbladder wall thickening is noted - nonspecific.  The liver, spleen, adrenal glands and pancreas are unremarkable.  A small amount of free fluid within the pelvis is identified. A Foley catheter in the bladder is identified. The bowel is within normal limits.  No acute or suspicious bony abnormalities are present.  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  Mild right hydronephrosis, right renal enlargement and perinephric inflammation may be related to obstruction by the right renal pelvic staghorn calculus or secondary changes from infection/pyelonephritis.  No gross focal abscess is identified.  Small amount of free pelvic fluid - nonspecific.  Nonspecific gallbladder wall thickening.  If there is strong clinical suspicion for cholecystitis, consider nuclear medicine study.  New moderate bibasilar consolidation/atelectasis with small bilateral pleural effusions.   Original Report Authenticated By: Harmon Pier, M.D.    Dg Chest 2 View  05/17/2012  *RADIOLOGY REPORT*  Clinical Data: Fever, vomiting  CHEST - 2 VIEW  Comparison: None.  Findings: Cardiomediastinal silhouette is unremarkable.  No acute infiltrate or pleural effusion.  No pulmonary edema.  Bony thorax is unremarkable.  IMPRESSION: No active disease.   Original Report Authenticated By: Natasha Mead, M.D.    Ir Perc Nephrostomy Right  05/18/2012  *RADIOLOGY REPORT*  Clinical history:71 year old with urosepsis.  The patient has bilateral kidney stones and mild dilatation of right renal collecting system. There is inflammation associated with the right kidney.  PROCEDURE(S): ULTRASOUND AND FLUOROSCOPIC GUIDED RIGHT PERCUTANEOUS NEPHROSTOMY TUBE PLACEMENT  Physician: Rachelle Hora. Lowella Dandy, MD  Medications:Fentanyl 25 mcg.  Fluoroscopy time: 2.7 minutes   Procedure:The procedure was explained to the patient.  The risks and benefits of the procedure were discussed and the patient's questions were addressed.  Informed consent was obtained from the patient.  The patient was placed prone.  The right flank was prepped and draped in a sterile fashion.  Maximal barrier sterile technique was utilized including caps, mask, sterile gowns, sterile gloves, sterile drape, hand hygiene and skin antiseptic.  The skin was anesthetized with lidocaine.  Using ultrasound guidance, a 21 gauge needle was directed into a mildly dilated lower pole calix. 0.018 wire was advanced into the collecting system.  An Accustick dilator set was placed.  A Kumpe catheter was advanced into the renal pelvis.  Small amount of contrast was injected to confirm placement within the collecting system.  A J wire was placed.  The tract was dilated and a 10-French Renee Pain catheter was placed within the upper aspect of the renal pelvis.  Catheter was sutured to the skin and attached to a gravity bag.  Approximately 5 ml of bloody purulent fluid was aspirated from the collecting system.  Findings:There are multiple stones within the right kidney.  There is a large staghorn calculus involving the renal pelvis.  A small amount of purulent fluid was aspirated from the renal collecting system.  Complications: None  Impression:Successful placement of a right percutaneous nephrostomy tube.  Small amount of bloody purulent fluid was aspirated from the collecting system and findings are suggestive for pyonephrosis. Fluid was sent for Gram stain and culture.   Original Report Authenticated By: Richarda Overlie, M.D.    Ir US Guide Bx Asp/drain  05/18/2012  *RADIOLOGY REPORT*  Clinical history:71 year old with urosepsis.  The patient has bilateral kidney stones and mild dilatation of right renal collecting system. There is inflammation associated  with the right kidney.  PROCEDURE(S): ULTRASOUND AND FLUOROSCOPIC GUIDED  RIGHT PERCUTANEOUS NEPHROSTOMY TUBE PLACEMENT  Physician: Rachelle Hora. Lowella Dandy, MD  Medications:Fentanyl 25 mcg.  Fluoroscopy time: 2.7 minutes  Procedure:The procedure was explained to the patient.  The risks and benefits of the procedure were discussed and the patient's questions were addressed.  Informed consent was obtained from the patient.  The patient was placed prone.  The right flank was prepped and draped in a sterile fashion.  Maximal barrier sterile technique was utilized including caps, mask, sterile gowns, sterile gloves, sterile drape, hand hygiene and skin antiseptic.  The skin was anesthetized with lidocaine.  Using ultrasound guidance, a 21 gauge needle was directed into a mildly dilated lower pole calix. 0.018 wire was advanced into the collecting system.  An Accustick dilator set was placed.  A Kumpe catheter was advanced into the renal pelvis.  Small amount of contrast was injected to confirm placement within the collecting system.  A J wire was placed.  The tract was dilated and a 10-French Renee Pain catheter was placed within the upper aspect of the renal pelvis.  Catheter was sutured to the skin and attached to a gravity bag.  Approximately 5 ml of bloody purulent fluid was aspirated from the collecting system.  Findings:There are multiple stones within the right kidney.  There is a large staghorn calculus involving the renal pelvis.  A small amount of purulent fluid was aspirated from the renal collecting system.  Complications: None  Impression:Successful placement of a right percutaneous nephrostomy tube.  Small amount of bloody purulent fluid was aspirated from the collecting system and findings are suggestive for pyonephrosis. Fluid was sent for Gram stain and culture.   Original Report Authenticated By: Richarda Overlie, M.D.    Dg Chest Port 1 View  05/20/2012  *RADIOLOGY REPORT*  Clinical Data: Shortness of breath.  Follow-up airspace disease.  PORTABLE CHEST - 1 VIEW  Comparison: 05/19/2012   Findings: Normal heart size and pulmonary vascularity.  Bilateral perihilar and lower lobe infiltrates.  No significant change since previous study.  Mild blunting of costophrenic angles suggest small effusions.  No pneumothorax.  Right central venous catheter is unchanged in position.  IMPRESSION: Stable appearance of bilateral pulmonary infiltrates.  Small pleural effusions bilaterally.   Original Report Authenticated By: Burman Nieves, M.D.    Dg Chest Port 1 View  05/19/2012  *RADIOLOGY REPORT*  Clinical Data: Wheezing.  Chest pain with deep breathing.  Fever.  PORTABLE CHEST - 1 VIEW  Comparison: 05/18/2012  Findings: Right lung base airspace opacity has increased.  There is persistent left perihilar left medial lung base opacity, which may also be mildly increased.  The right internal jugular central venous line is stable well- positioned.  The heart is normal in size.  The mediastinum is normal in contour caliber.  No pneumothorax.  IMPRESSION: There has been an interval increase in lung base opacity most evident on the right.  This supports bilateral pneumonia as the cause of these airspace opacities.   Original Report Authenticated By: Amie Portland, M.D.    Dg Chest Port 1 View  05/18/2012  *RADIOLOGY REPORT*  Clinical Data: 71 year old female with sepsis and fever.  Right central venous catheter placement.  PORTABLE CHEST - 1 VIEW  Comparison: Film earlier this date.  Findings: The cardiomediastinal silhouette is stable. A right IJ central venous catheter has been placed with tip overlying the mid SVC. There is no evidence of pneumothorax. Mild bibasilar opacities, right greater  than left are noted may represent atelectasis or pneumonia. There is no evidence of pleural effusion.  IMPRESSION: Right IJ central venous catheter placement with tip overlying the mid SVC - no evidence of pneumothorax.  Persistent bibasilar opacities, right greater than left - question atelectasis versus pneumonia.    Original Report Authenticated By: Harmon Pier, M.D.    Dg Chest Port 1 View  05/18/2012  *RADIOLOGY REPORT*  Clinical Data: Hypoxia.  Fever.  PORTABLE CHEST - 1 VIEW  Comparison: Plain films of the chest 05/17/2012.  Findings: Since yesterday's examination, the patient has developed perihilar and bibasilar airspace disease, greater on the right.  No pneumothorax is identified.  No pleural effusion is seen.  Heart size is normal.  IMPRESSION: New bilateral airspace disease could be due to asymmetric edema and/or pneumonia.   Original Report Authenticated By: Holley Dexter, M.D.     MEDICATIONS: Scheduled Meds:   . aspirin  81 mg Oral Daily  . cefTRIAXone (ROCEPHIN)  IV  2 g Intravenous Q24H  . heparin subcutaneous  5,000 Units Subcutaneous Q8H  . pantoprazole  40 mg Oral Daily  . pentosan polysulfate  100 mg Oral BID  . sodium chloride  10-40 mL Intracatheter Q12H   Continuous Infusions:  PRN Meds:.acetaminophen, diazepam, morphine injection, ondansetron (ZOFRAN) IV, ondansetron, oxyCODONE, sodium chloride  Antibiotics: Anti-infectives     Start     Dose/Rate Route Frequency Ordered Stop   05/21/12 0000   levofloxacin (LEVAQUIN) IVPB 750 mg  Status:  Discontinued        750 mg 100 mL/hr over 90 Minutes Intravenous Every 48 hours 05/19/12 0056 05/20/12 1008   05/20/12 1100   cefTRIAXone (ROCEPHIN) 2 g in dextrose 5 % 50 mL IVPB        2 g 100 mL/hr over 30 Minutes Intravenous Every 24 hours 05/20/12 1009     05/19/12 0100   levofloxacin (LEVAQUIN) IVPB 750 mg        750 mg 100 mL/hr over 90 Minutes Intravenous  Once 05/19/12 0056 05/19/12 0305   05/19/12 0000   vancomycin (VANCOCIN) IVPB 1000 mg/200 mL premix  Status:  Discontinued        1,000 mg 200 mL/hr over 60 Minutes Intravenous Every 24 hours 05/18/12 0050 05/18/12 1114   05/18/12 0800   piperacillin-tazobactam (ZOSYN) IVPB 3.375 g  Status:  Discontinued        3.375 g 12.5 mL/hr over 240 Minutes Intravenous Every 8  hours 05/17/12 2143 05/20/12 1008   05/18/12 0100   vancomycin (VANCOCIN) IVPB 1000 mg/200 mL premix        1,000 mg 200 mL/hr over 60 Minutes Intravenous NOW 05/18/12 0050 05/18/12 0343   05/18/12 0100  piperacillin-tazobactam (ZOSYN) IVPB 3.375 g       3.375 g 12.5 mL/hr over 240 Minutes Intravenous  Once 05/18/12 0051 05/18/12 0508   05/17/12 2200   piperacillin-tazobactam (ZOSYN) IVPB 3.375 g  Status:  Discontinued        3.375 g 100 mL/hr over 30 Minutes Intravenous Every 12 hours 05/17/12 2123 05/17/12 2139   05/17/12 1800  piperacillin-tazobactam (ZOSYN) IVPB 3.375 g       3.375 g 100 mL/hr over 30 Minutes Intravenous  Once 05/17/12 1756 05/17/12 Rhae Hammock, MD  [facility] Pager:[phone]  If 7PM-7AM, please contact night-coverage [email or web address] Password [email or web address] 05/21/2012, 3:00 PM   LOS: 4 days

## 2012-05-21 NOTE — Progress Notes (Signed)
Physical medicine and rehabilitation consult requested and chart reviewed. As per rehabilitation admissions coordinator note  [facility] will not approve admission for deconditioning related to sepsis thus we'll hold on formal rehabilitation consult at this time recommending skilled nursing facility

## 2012-05-22 ENCOUNTER — Inpatient Hospital Stay (HOSPITAL_COMMUNITY): Payer: PRIVATE HEALTH INSURANCE

## 2012-05-22 DIAGNOSIS — B964 Proteus (mirabilis) (morganii) as the cause of diseases classified elsewhere: Secondary | ICD-10-CM | POA: Diagnosis present

## 2012-05-22 LAB — CULTURE, ROUTINE-ABSCESS

## 2012-05-22 LAB — COMPREHENSIVE METABOLIC PANEL
BUN: 13 mg/dL (ref 6–23)
CO2: 26 mEq/L (ref 19–32)
Calcium: 9.1 mg/dL (ref 8.4–10.5)
Creatinine, Ser: 1 mg/dL (ref 0.50–1.10)
GFR calc Af Amer: 64 mL/min — ABNORMAL LOW (ref >90)
GFR calc non Af Amer: 55 mL/min — ABNORMAL LOW (ref >90)
Glucose, Bld: 91 mg/dL (ref 70–99)

## 2012-05-22 LAB — CBC
Hemoglobin: 9.3 g/dL — ABNORMAL LOW (ref 12.0–15.0)
MCH: 24.5 pg — ABNORMAL LOW (ref 26.0–34.0)
MCHC: 32.6 g/dL (ref 30.0–36.0)
MCV: 75.2 fL — ABNORMAL LOW (ref 78.0–100.0)
RBC: 3.79 MIL/uL — ABNORMAL LOW (ref 3.87–5.11)

## 2012-05-22 LAB — PROCALCITONIN: Procalcitonin: 30.37 ng/mL

## 2012-05-22 MED ORDER — CEFUROXIME AXETIL 500 MG PO TABS: 500.0000 mg | ORAL_TABLET | Freq: Two times a day (BID) | ORAL | Status: DC

## 2012-05-22 MED ORDER — FUROSEMIDE 10 MG/ML IJ SOLN
40.0000 mg | Freq: Once | INTRAMUSCULAR | Status: DC
  Filled 2012-05-22: qty 4

## 2012-05-22 MED ORDER — POTASSIUM CHLORIDE CRYS ER 20 MEQ PO TBCR
60.0000 meq | EXTENDED_RELEASE_TABLET | Freq: Once | ORAL | Status: AC
  Administered 2012-05-22: 60 meq via ORAL
  Filled 2012-05-22: qty 1

## 2012-05-22 MED ORDER — ZOLPIDEM TARTRATE 5 MG PO TABS
5.0000 mg | ORAL_TABLET | Freq: Once | ORAL | Status: AC
  Administered 2012-05-22: 5 mg via ORAL
  Filled 2012-05-22: qty 1

## 2012-05-22 NOTE — Progress Notes (Signed)
Occupational Therapy Treatment Patient Details Name: [name] MRN: [id number] DOB: 05-03-1941 Today's Date: 05/22/2012 Time: 7846-9629 OT Time Calculation (min): 28 min  OT Assessment / Plan / Recommendation Comments on Treatment Session Making excellent progress. Pt educated on using incentive spirometer and importance of using spirometer. O2 SATs decreased to @ 90 with activity, but quickly rebounded to 95 after rest. Discussed E conservation techniques.Pt demonstrating improvement with strength and endurance. Pt will benefit from continued rehab at [facility].    Follow Up Recommendations  SNF    Barriers to Discharge   none    Equipment Recommendations  3 in 1 bedside comode    Recommendations for Other Services  none  Frequency Min 3X/week   Plan Discharge plan remains appropriate    Precautions / Restrictions Precautions Precautions: Fall   Pertinent Vitals/Pain No c/o pain. O2 SATS 90 - 95 with activity. C/o dizziness with activity at times    ADL  Grooming: Wash/dry hands;Supervision/safety Where Assessed - Grooming: Unsupported standing Equipment Used: Gait belt;Rolling walker Transfers/Ambulation Related to ADLs: minguard ADL Comments: Able to donn/doff socks with set up. Focus of session on strengthening and ecdurance for ADL.Participated in dynamic standing activites with S for @ 7 min  before requiring rest break.    OT Diagnosis:    OT Problem List:   OT Treatment Interventions:     OT Goals Acute Rehab OT Goals OT Goal Formulation: With patient Time For Goal Achievement: 06/03/12 Potential to Achieve Goals: Good ADL Goals Pt Will Perform Grooming: with supervision;Standing at sink;Unsupported ADL Goal: Grooming - Progress: Progressing toward goals Pt Will Perform Upper Body Bathing: with supervision;with set-up;Sitting, edge of bed;Unsupported ADL Goal: Upper Body Bathing - Progress: Progressing toward goals Pt Will Perform Lower Body Bathing: with  supervision;Sit to stand from bed;Unsupported Pt Will Transfer to Toilet: with supervision;Ambulation;3-in-1 ADL Goal: Toilet Transfer - Progress: Progressing toward goals Pt Will Perform Toileting - Clothing Manipulation: with modified independence;Sitting on 3-in-1 or toilet;Standing ADL Goal: Toileting - Clothing Manipulation - Progress: Progressing toward goals Pt Will Perform Toileting - Hygiene: with modified independence;Standing at 3-in-1/toilet;Sit to stand from 3-in-1/toilet ADL Goal: Toileting - Hygiene - Progress: Progressing toward goals Additional ADL Goal #1: Pt will verbalize 3 E conservation techniques for ADL independently ADL Goal: Additional Goal #1 - Progress: Progressing toward goals Additional ADL Goal #2: Pt will complete functional mobility @ RW level with S during ADL task  ADL Goal: Additional Goal #2 - Progress: Progressing toward goals Arm Goals Pt Will Complete Theraband Exer: with supervision, verbal cues required/provided;to increase strength;Bilateral upper extremities;1 set;Level 1 Theraband Arm Goal: Theraband Exercises - Progress: Progressing toward goal  Visit Information  Last OT Received On: 05/22/12 Assistance Needed: +1    Subjective Data      Prior Functioning    Independent   Cognition  Overall Cognitive Status: Appears within functional limits for tasks assessed/performed Arousal/Alertness: Awake/alert Orientation Level: Appears intact for tasks assessed Behavior During Session: [facility] for tasks performed    Mobility  Shoulder Instructions Transfers Transfers: Sit to Stand;Stand to Sit Sit to Stand: 5: Supervision;With upper extremity assist;From chair/3-in-1 Stand to Sit: 5: Supervision;With upper extremity assist;To chair/3-in-1 Details for Transfer Assistance: good safety technique       Exercises  General Exercises - Upper Extremity Shoulder Flexion: Theraband;Strengthening;Both;10 reps;Seated Theraband Level (Shoulder Flexion):  Level 1 (Yellow) Shoulder Extension: AROM;Strengthening;Both;10 reps;Seated;Theraband Theraband Level (Shoulder Extension): Level 1 (Yellow) Shoulder ABduction: AROM;Strengthening;Both;10 reps;Theraband Theraband Level (Shoulder Abduction): Level 1 (Yellow) Elbow  Flexion: Strengthening;Both;10 reps;Seated;Theraband Theraband Level (Elbow Flexion): Level 1 (Yellow) Elbow Extension: Strengthening;Both;10 reps;Seated;Theraband Theraband Level (Elbow Extension): Level 1 (Yellow) Chair Push Up: Strengthening;10 reps   Balance Static Standing Balance Static Standing - Balance Support: Bilateral upper extremity supported (on walker) Static Standing - Level of Assistance: 5: Stand by assistance   End of Session OT - End of Session Activity Tolerance: Patient tolerated treatment well Patient left: in chair;with call bell/phone within reach;with family/visitor present Nurse Communication: Mobility status  GO     Lashaunta Sicard,HILLARY 05/22/2012, 4:08 PM [facility], OTR/L  [phone] 05/22/2012

## 2012-05-22 NOTE — Progress Notes (Signed)
Physical Therapy Treatment Patient Details Name: Rachael Greer MRN: [id number] DOB: Oct 23, 1940 Today's Date: 05/22/2012 Time: 4098-1191 PT Time Calculation (min): 32 min  PT Assessment / Plan / Recommendation Comments on Treatment Session  Pt adm with septic shock, acute resp failure, UTI, hydronephrosis (nephrostomy tube placed).  Pt making good progress.    Follow Up Recommendations  SNF     Does the patient have the potential to tolerate intense rehabilitation     Barriers to Discharge        Equipment Recommendations  Rolling walker with 5" wheels    Recommendations for Other Services    Frequency Min 3X/week   Plan Discharge plan remains appropriate;Frequency remains appropriate    Precautions / Restrictions Precautions Precautions: Fall   Pertinent Vitals/Pain SaO2 86% on RA at rest; 92% at rest and with amb on 2L.    Mobility  Transfers Sit to Stand: 4: Min guard;With upper extremity assist;From bed;With armrests;From chair/3-in-1 Stand to Sit: 4: Min guard;With upper extremity assist;With armrests;To chair/3-in-1 Details for Transfer Assistance: verbal cues for hand placement Ambulation/Gait Ambulation/Gait Assistance: 4: Min guard Ambulation Distance (Feet): 250 Feet Assistive device: Rolling walker Ambulation/Gait Assistance Details: rolling walker veering to the rt. Gait Pattern: Step-through pattern;Decreased stride length;Trunk flexed    Exercises     PT Diagnosis:    PT Problem List:   PT Treatment Interventions:     PT Goals Acute Rehab PT Goals PT Goal: Sit to Stand - Progress: Progressing toward goal PT Goal: Stand to Sit - Progress: Progressing toward goal PT Goal: Ambulate - Progress: Progressing toward goal  Visit Information  Last PT Received On: 05/22/12 Assistance Needed: +1    Subjective Data  Subjective: "I am going to get to go," pt stated about leaving.   Cognition  Overall Cognitive Status: Appears within functional limits for  tasks assessed/performed Arousal/Alertness: Awake/alert Orientation Level: Appears intact for tasks assessed Behavior During Session: [employer] for tasks performed    Balance  Static Standing Balance Static Standing - Balance Support: Bilateral upper extremity supported (on walker) Static Standing - Level of Assistance: 5: Stand by assistance  End of Session PT - End of Session Equipment Utilized During Treatment: Gait belt Activity Tolerance: Patient tolerated treatment well Patient left: in chair;with call bell/phone within reach;with family/visitor present Nurse Communication: Mobility status   GP     Rachael Greer 05/22/2012, 12:27 PM  [employer] PT [phone]

## 2012-05-22 NOTE — Progress Notes (Signed)
Pt nephrostomy tube has blood in it and she is reporting that she is very cold. Pt is shivering and saying that her chest hurts. Doctor has been notified. Medication has been given for pain. Will continue to monitor.

## 2012-05-22 NOTE — Discharge Summary (Signed)
Physician Discharge Summary  Annajulia Lewing WGN:[id number] DOB: 06/07/1941 DOA: 05/17/2012  PCP: No primary provider on file.  Admit date: 05/17/2012 Discharge date: 05/22/2012  Time spent: 40 minutes minutes  Recommendations for Outpatient Follow-up:  1. Follow up Dr. Logan Bores for evaluation of extraction of the staghorn kidney stones and, evaluation of the right sided percutaneous nephrostomy tubes.  Discharge Diagnoses:  Principal Problem:  *Septic shock(785.52) Active Problems:  Hypokalemia  Acute on chronic renal failure  Transaminitis  Leukocytosis  Hyponatremia  Microcytic anemia  UTI (lower urinary tract infection) 2/2 Proteus  Bacteremia 2/2 UTI  Right Hydronephrosis with ureteral calculus  Chronic diastolic heart failure, NYHA class 1  Demand ischemia  Acute respiratory failure with hypoxia 2/2 ALI   Discharge Condition: Stable  Diet recommendation: Heart healthy diet  Filed Weights   05/18/12 0040  Weight: 64.411 kg (142 lb)    History of present illness:  Pt is 71 yo female who was transferred from [facility] for further evaluation of 3 days long fever, Tmax 102F, associated with chills, generalized fatigue, nausea, non bloody vomiting, poor oral intake. 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 She reports similar episodes in the past when she was diagnosed with UTI but it was never as intense. Pt is unable to provide detailed history as she is tired and not able to stay alert long enough during this encounter.  ED EVENTS: At [facility] pt febrile with Tmax 102 F, UA with large leukocytes and nitrites, WBC and many bacteria, also noted to have mild leukocytosis and acute on chronic renal failure. Transfer to [facility] required for treatment of potential urosepsis  Hospital Course:   1. Proteus mirabilis  bacteremia: Likely secondary to her UTI, this cause severe sepsis, as mentioned above the time of admission patient was placed on telemetry, but she decompensated and she was transferred to the ICU because of shortness of breath. She never required intubation or mechanical ventilation, x-ray was showing new infiltrates was unclear 's secondary to asymmetrical pulmonary edema versus pneumonia. With diuresis patient got better, the patient was also on antibiotics for her sepsis. Patient was placed empirically on Rocephin at the time of admission and this was continued throughout the hospital stay, cultures from blood, urine and the urine aspect on the right PCN tube placed all showed Proteus mirabilis. Patient got 4 days of antibiotics so far, 10 more days of antibiotics to be scheduled as outpatient.  2. Severe sepsis: Secondary to UTI and bacteremia, patient to empirically with Rocephin the time of admission, patient currently afebrile the white blood cells were 14.3 cefazolin the time of admission and procalcitonin was more than 175, WBC are normal now, procalcitonin 30 today.  3. Bilateral renal stones with right-sided hydronephrosis: The time of admission CT scan of abdomen pelvis with done and showed bilateral staghorn kidney stones, Dr. Hubert Azure. from neurology recommended percutaneous nephrostomy tube placed because of right-sided hydronephrosis. This was placed by interventional radiology on 05/18/2012. Urology recommended to keep the right PCN tube, and patient to followup with her urologist Dr. Marcelyn Bruins in [facility].  4. Acute respiratory failure: As mentioned above patient had shortness of breath, chest x-ray showed significant bilateral infiltrates, cannot rule out asymmetric pulmonary edema versus pneumonia. Patient was treated with antibiotics and diuresis. Patient fluid balance is negative by 3 L of,  discharge, she will be discharged on antibiotics for 10 more days to  cover for pneumonia. Pulmonology saw the patient and reported that respiratory failure might be secondary to acute lung injury.  5. Demand ischemia: Patient troponin was 0.3 at the time of admission, that increased steadily to reach peak of 0.73 on 05/18/2012, the very next day the troponin was 0.3. The patient denies any chest pain. The slight elevation on the troponin is secondary to demand ischemia. Patient was maintained on her aspirin and beta blockers while she is in the hospital.  6. Interstitial cystitis: Chronic problem, patient follows with Dr. Logan [facility]. She was on Macrodantin secondary to recurrent UTIs. At the time of discharge patient will be on Ceftin for 10 more days, asked not to take the Macrodantin was is taking the Ceftin.  7. Anemia: This is likely secondary to acute illness, anemia panel showed anemia of chronic disease with low iron. Does not have any overt bleeding. If anemia continues to be initiated, to followup with gastroenterology as outpatient. Hemoglobin the time of discharge is 9.3.  Procedures:  Placement of right percutaneous nephrostomy tube with ultrasound and fluoroscopic guidance, placed by IR on 05/18/2012.  Placement of right IJ CVL by Dr. Corliss Blacker on 05/18/2012.  Consultations:  Critical care medicine.  Discharge Exam: Filed Vitals:   05/21/12 2100 05/22/12 0515 05/22/12 0615 05/22/12 0617  BP: 129/73 148/69    Pulse: 105 124 112   Temp: 98.3 F (36.8 C) 98 F (36.7 C) 100.2 F (37.9 C)   TempSrc: Oral Oral    Resp: 26 26 18    Height:      Weight:      SpO2: 94% 90% 85% 90%   General: Alert and awake, oriented x3, not in any acute distress. HEENT: anicteric sclera, pupils reactive to light and accommodation, EOMI CVS: S1-S2 clear, no murmur rubs or gallops Chest: clear to auscultation bilaterally, no wheezing, rales or rhonchi Abdomen: soft nontender, nondistended, normal bowel sounds, no organomegaly Extremities: no cyanosis,  clubbing or edema noted bilaterally Neuro: Cranial nerves II-XII intact, no focal neurological deficits  Discharge Instructions  Discharge Orders    Future Orders Please Complete By Expires   Diet - low sodium heart healthy      Increase activity slowly          Medication List     As of 05/22/2012 11:01 AM    STOP taking these medications         nitrofurantoin 50 MG capsule   Commonly known as: MACRODANTIN      TAKE these medications         amLODipine 5 MG tablet   Commonly known as: NORVASC   Take 7.5 mg by mouth daily.      cefUROXime 500 MG tablet   Commonly known as: CEFTIN   Take 1 tablet (500 mg total) by mouth 2 (two) times daily.      diazepam 5 MG tablet   Commonly known as: VALIUM   Take 5 mg by mouth 2 (two) times daily as needed. For anxiety      ferrous sulfate 325 (65 FE) MG tablet   Take 325 mg by mouth daily.      omeprazole 40 MG capsule   Commonly known as: PRILOSEC   Take 40 mg by mouth daily.      pentosan polysulfate 100 MG capsule   Commonly known as: ELMIRON   Take 100 mg by mouth 2 (  two) times daily.           Follow-up Information    Follow up with Marcelyn Bruins, MD. On 05/29/2012. (Appointment with Dr. Logan Bores willl be 18/18/13 at 11am Fax over information to [phone])    Contact information:   [address] [place] [address] [phone]           The results of significant diagnostics from this hospitalization (including imaging, microbiology, ancillary and laboratory) are listed below for reference.    Significant Diagnostic Studies: Ct Abdomen Pelvis Wo Contrast  05/18/2012  *RADIOLOGY REPORT*  Clinical Data: 71 year old female with abdominal pelvic pain, chronic UTI and pyelonephritis.  CT ABDOMEN AND PELVIS WITHOUT CONTRAST  Technique:  Multidetector CT imaging of the abdomen and pelvis was performed following the standard protocol without intravenous contrast.  Comparison: 04/13/2010 CT  Findings: Moderate  bibasilar atelectasis/consolidation with small bilateral pleural effusions noted.  New staghorn calculi are present within the right renal pelvis extending towards the UPJ, posterior right upper pole and posterior left upper pole. The right kidney is enlarged with perinephric inflammation and mild right hydronephrosis. No gross abscess is identified. A 3 mm nonobstructing calculus within the left lower pole is present.  Gallbladder wall thickening is noted - nonspecific.  The liver, spleen, adrenal glands and pancreas are unremarkable.  A small amount of free fluid within the pelvis is identified. A Foley catheter in the bladder is identified. The bowel is within normal limits.  No acute or suspicious bony abnormalities are present.  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  Mild right hydronephrosis, right renal enlargement and perinephric inflammation may be related to obstruction by the right renal pelvic staghorn calculus or secondary changes from infection/pyelonephritis.  No gross focal abscess is identified.  Small amount of free pelvic fluid - nonspecific.  Nonspecific gallbladder wall thickening.  If there is strong clinical suspicion for cholecystitis, consider nuclear medicine study.  New moderate bibasilar consolidation/atelectasis with small bilateral pleural effusions.   Original Report Authenticated By: Harmon Pier, M.D.    Dg Chest 2 View  05/17/2012  *RADIOLOGY REPORT*  Clinical Data: Fever, vomiting  CHEST - 2 VIEW  Comparison: None.  Findings: Cardiomediastinal silhouette is unremarkable.  No acute infiltrate or pleural effusion.  No pulmonary edema.  Bony thorax is unremarkable.  IMPRESSION: No active disease.   Original Report Authenticated By: Natasha Mead, M.D.    Ir Perc Nephrostomy Right  05/18/2012  *RADIOLOGY REPORT*  Clinical history:71 year old with urosepsis.  The patient has bilateral kidney stones and mild dilatation of right renal  collecting system. There is inflammation associated with the right kidney.  PROCEDURE(S): ULTRASOUND AND FLUOROSCOPIC GUIDED RIGHT PERCUTANEOUS NEPHROSTOMY TUBE PLACEMENT  Physician: Rachelle Hora. Lowella Dandy, MD  Medications:Fentanyl 25 mcg.  Fluoroscopy time: 2.7 minutes  Procedure:The procedure was explained to the patient.  The risks and benefits of the procedure were discussed and the patient's questions were addressed.  Informed consent was obtained from the patient.  The patient was placed prone.  The right flank was prepped and draped in a sterile fashion.  Maximal barrier sterile technique was utilized including caps, mask, sterile gowns, sterile gloves, sterile drape, hand hygiene and skin antiseptic.  The skin was anesthetized with lidocaine.  Using ultrasound guidance, a 21 gauge needle was directed into a mildly dilated lower pole calix. 0.018 wire was advanced into the collecting system.  An Accustick dilator set was placed.  A Kumpe catheter was advanced into the  renal pelvis.  Small amount of contrast was injected to confirm placement within the collecting system.  A J wire was placed.  The tract was dilated and a 10-French Renee Pain catheter was placed within the upper aspect of the renal pelvis.  Catheter was sutured to the skin and attached to a gravity bag.  Approximately 5 ml of bloody purulent fluid was aspirated from the collecting system.  Findings:There are multiple stones within the right kidney.  There is a large staghorn calculus involving the renal pelvis.  A small amount of purulent fluid was aspirated from the renal collecting system.  Complications: None  Impression:Successful placement of a right percutaneous nephrostomy tube.  Small amount of bloody purulent fluid was aspirated from the collecting system and findings are suggestive for pyonephrosis. Fluid was sent for Gram stain and culture.   Original Report Authenticated By: Richarda Overlie, M.D.    Ir US Guide Bx Asp/drain  05/18/2012   *RADIOLOGY REPORT*  Clinical history:71 year old with urosepsis.  The patient has bilateral kidney stones and mild dilatation of right renal collecting system. There is inflammation associated with the right kidney.  PROCEDURE(S): ULTRASOUND AND FLUOROSCOPIC GUIDED RIGHT PERCUTANEOUS NEPHROSTOMY TUBE PLACEMENT  Physician: Rachelle Hora. Lowella Dandy, MD  Medications:Fentanyl 25 mcg.  Fluoroscopy time: 2.7 minutes  Procedure:The procedure was explained to the patient.  The risks and benefits of the procedure were discussed and the patient's questions were addressed.  Informed consent was obtained from the patient.  The patient was placed prone.  The right flank was prepped and draped in a sterile fashion.  Maximal barrier sterile technique was utilized including caps, mask, sterile gowns, sterile gloves, sterile drape, hand hygiene and skin antiseptic.  The skin was anesthetized with lidocaine.  Using ultrasound guidance, a 21 gauge needle was directed into a mildly dilated lower pole calix. 0.018 wire was advanced into the collecting system.  An Accustick dilator set was placed.  A Kumpe catheter was advanced into the renal pelvis.  Small amount of contrast was injected to confirm placement within the collecting system.  A J wire was placed.  The tract was dilated and a 10-French Renee Pain catheter was placed within the upper aspect of the renal pelvis.  Catheter was sutured to the skin and attached to a gravity bag.  Approximately 5 ml of bloody purulent fluid was aspirated from the collecting system.  Findings:There are multiple stones within the right kidney.  There is a large staghorn calculus involving the renal pelvis.  A small amount of purulent fluid was aspirated from the renal collecting system.  Complications: None  Impression:Successful placement of a right percutaneous nephrostomy tube.  Small amount of bloody purulent fluid was aspirated from the collecting system and findings are suggestive for pyonephrosis.  Fluid was sent for Gram stain and culture.   Original Report Authenticated By: Richarda Overlie, M.D.    Dg Chest Port 1 View  05/22/2012  *RADIOLOGY REPORT*  Clinical Data: Shortness of breath.  PORTABLE CHEST - 1 VIEW  Comparison: 05/20/2012  Findings: Right central line is unchanged.  Bilateral airspace disease and layering effusions again noted, similar or slightly worsened since prior study.  Heart is normal size.  No acute bony abnormality.  IMPRESSION: Stable or slight interval worsening of bilateral airspace disease and layering effusions.   Original Report Authenticated By: Charlett Nose, M.D.    Dg Chest Port 1 View  05/20/2012  *RADIOLOGY REPORT*  Clinical Data: Shortness of breath.  Follow-up airspace disease.  PORTABLE  CHEST - 1 VIEW  Comparison: 05/19/2012  Findings: Normal heart size and pulmonary vascularity.  Bilateral perihilar and lower lobe infiltrates.  No significant change since previous study.  Mild blunting of costophrenic angles suggest small effusions.  No pneumothorax.  Right central venous catheter is unchanged in position.  IMPRESSION: Stable appearance of bilateral pulmonary infiltrates.  Small pleural effusions bilaterally.   Original Report Authenticated By: Burman Nieves, M.D.    Dg Chest Port 1 View  05/19/2012  *RADIOLOGY REPORT*  Clinical Data: Wheezing.  Chest pain with deep breathing.  Fever.  PORTABLE CHEST - 1 VIEW  Comparison: 05/18/2012  Findings: Right lung base airspace opacity has increased.  There is persistent left perihilar left medial lung base opacity, which may also be mildly increased.  The right internal jugular central venous line is stable well- positioned.  The heart is normal in size.  The mediastinum is normal in contour caliber.  No pneumothorax.  IMPRESSION: There has been an interval increase in lung base opacity most evident on the right.  This supports bilateral pneumonia as the cause of these airspace opacities.   Original Report Authenticated By:  Amie Portland, M.D.    Dg Chest Port 1 View  05/18/2012  *RADIOLOGY REPORT*  Clinical Data: 72 year old female with sepsis and fever.  Right central venous catheter placement.  PORTABLE CHEST - 1 VIEW  Comparison: Film earlier this date.  Findings: The cardiomediastinal silhouette is stable. A right IJ central venous catheter has been placed with tip overlying the mid SVC. There is no evidence of pneumothorax. Mild bibasilar opacities, right greater than left are noted may represent atelectasis or pneumonia. There is no evidence of pleural effusion.  IMPRESSION: Right IJ central venous catheter placement with tip overlying the mid SVC - no evidence of pneumothorax.  Persistent bibasilar opacities, right greater than left - question atelectasis versus pneumonia.   Original Report Authenticated By: Harmon Pier, M.D.    Dg Chest Port 1 View  05/18/2012  *RADIOLOGY REPORT*  Clinical Data: Hypoxia.  Fever.  PORTABLE CHEST - 1 VIEW  Comparison: Plain films of the chest 05/17/2012.  Findings: Since yesterday's examination, the patient has developed perihilar and bibasilar airspace disease, greater on the right.  No pneumothorax is identified.  No pleural effusion is seen.  Heart size is normal.  IMPRESSION: New bilateral airspace disease could be due to asymmetric edema and/or pneumonia.   Original Report Authenticated By: Holley Dexter, M.D.     Microbiology: Recent Results (from the past 240 hour(s))  CULTURE, BLOOD (ROUTINE X 2)     Status: Normal   Collection Time   05/17/12  3:30 PM      Component Value Range Status Comment   Specimen Description BLOOD RIGHT THUMB   Final    Special Requests BOTTLES DRAWN AEROBIC AND ANAEROBIC [facility] EACH   Final    Culture  Setup Time 05/18/2012 00:54   Final    Culture     Final    Value: PROTEUS MIRABILIS     Note: Gram Stain Report Called to,Read Back By and Verified With: SARA GROCE 05/18/12 @ 1:20PM BY RUSCA.   Report Status 05/20/2012 FINAL   Final    Organism  ID, Bacteria PROTEUS MIRABILIS   Final   CULTURE, BLOOD (ROUTINE X 2)     Status: Normal   Collection Time   05/17/12  3:43 PM      Component Value Range Status Comment   Specimen Description BLOOD RIGHT HAND  Final    Special Requests BOTTLES DRAWN AEROBIC AND ANAEROBIC [facility] EACH   Final    Culture  Setup Time 05/18/2012 00:54   Final    Culture     Final    Value: PROTEUS MIRABILIS     Note: SUSCEPTIBILITIES PERFORMED ON PREVIOUS CULTURE WITHIN THE LAST 5 DAYS.     Note: Gram Stain Report Called to,Read Back By and Verified With: [facility] WHITE 05/18/12 @ 9:05PM BY RUSCA.   Report Status 05/20/2012 FINAL   Final   URINE CULTURE     Status: Normal   Collection Time   05/17/12  5:25 PM      Component Value Range Status Comment   Specimen Description URINE, CLEAN CATCH   Final    Special Requests NONE   Final    Culture  Setup Time 05/18/2012 01:23   Final    Colony Count 85,000 COLONIES/ML   Final    Culture PROTEUS MIRABILIS   Final    Report Status 05/20/2012 FINAL   Final    Organism ID, Bacteria PROTEUS MIRABILIS   Final   URINE CULTURE     Status: Normal   Collection Time   05/17/12  5:30 PM      Component Value Range Status Comment   Specimen Description URINE, CLEAN CATCH   Final    Special Requests NONE   Final    Culture  Setup Time 05/18/2012 01:23   Final    Colony Count 75,000 COLONIES/ML   Final    Culture PROTEUS MIRABILIS   Final    Report Status 05/20/2012 FINAL   Final    Organism ID, Bacteria PROTEUS MIRABILIS   Final   MRSA PCR SCREENING     Status: Normal   Collection Time   05/17/12 10:00 PM      Component Value Range Status Comment   MRSA by PCR NEGATIVE  NEGATIVE Final   CULTURE, ROUTINE-ABSCESS     Status: Normal   Collection Time   05/18/12  6:33 PM      Component Value Range Status Comment   Specimen Description ABSCESS   Final    Special Requests ASPIRATE FROM RIGHT KIDNEY   Final    Gram Stain     Final    Value: ABUNDANT WBC PRESENT, PREDOMINANTLY  PMN     FEW GRAM NEGATIVE RODS   Culture FEW PROTEUS MIRABILIS   Final    Report Status 05/22/2012 FINAL   Final    Organism ID, Bacteria PROTEUS MIRABILIS   Final      Labs: Basic Metabolic Panel:  Lab 05/22/12 1610 05/21/12 0625 05/20/12 0445 05/19/12 0810 05/18/12 1728 05/18/12 1021  NA 139 137 137 140 141 --  K 3.5 3.2* 3.2* 3.2* 3.0* --  CL 103 105 109 111 114* --  CO2 26 24 20  17* 18* --  GLUCOSE 91 100* 90 102* 96 --  BUN 13 14 16 17 19  --  CREATININE 1.00 1.00 1.05 1.15* 1.45* --  CALCIUM 9.1 8.5 8.5 8.3* 7.8* --  MG -- -- -- -- 1.4* 1.5  PHOS -- -- -- -- -- --   Liver Function Tests:  Lab 05/22/12 0500 05/21/12 0625 05/18/12 1728 05/18/12 0600 05/17/12 1514  AST 39* 59* 71* 68* 46*  ALT 38* 43* 48* 44* 45*  ALKPHOS 230* 250* 210* 89 97  BILITOT 0.4 0.4 0.7 0.8 0.8  PROT 5.8* 5.3* 5.1* 4.8* 7.3  ALBUMIN 2.1* 1.8* 2.0* 1.8* 3.0*  No results found for this basename: LIPASE:5,AMYLASE:5 in the last 168 hours No results found for this basename: AMMONIA:5 in the last 168 hours CBC:  Lab 05/22/12 0500 05/20/12 0445 05/19/12 0810 05/18/12 1728 05/18/12 0600 05/17/12 1514  WBC 11.9* 12.7* 17.9* 12.2* 11.4* --  NEUTROABS -- -- -- -- -- 13.3*  HGB 9.3* 8.6* 8.9* 8.0* 7.6* --  HCT 28.5* 25.6* 27.2* 24.1* 23.7* --  MCV 75.2* 75.3* 76.2* 77.0* 77.7* --  PLT 253 181 179 141* 151 --   Cardiac Enzymes:  Lab 05/19/12 0052 05/18/12 1725 05/18/12 1540 05/18/12 1021 05/17/12 2250 05/17/12 1514  CKTOTAL -- -- -- 1690* -- 201*  CKMB -- -- -- 5.4* -- 1.2  CKMBINDEX -- -- -- -- -- --  TROPONINI <0.30 0.73* 0.71* -- 0.44* <0.30   BNP: BNP (last 3 results) No results found for this basename: PROBNP:3 in the last 8760 hours CBG:  Lab 05/18/12 1505 05/18/12 1131  GLUCAP 98 126*       Signed:  Lossie Kalp A  [facility] 05/22/2012, 11:01 AM

## 2012-05-22 NOTE — Progress Notes (Signed)
Doctor notified about nephrostomy tube draining blood. Nurse was told to hold morning heparin until rounding physician comes to see pt.

## 2012-05-22 NOTE — Progress Notes (Signed)
OT Cancellation Note  Patient Details Name: Rachael Greer MRN: [id number] DOB: Sep 16, 1940   Cancelled Treatment:    Reason Eval/Treat Not Completed: Fatigue/lethargy limiting ability to participate;Other (comment) (Pt had pain meds making her very sleepy and lethargic.)  [facility] Mikisha Roseland, OTR/L  [phone] 12/11/201312/04/2012, 10:55 AM

## 2012-05-22 NOTE — Progress Notes (Signed)
Pt nephrostomy bag was pinned to pt's bed. Educated pt about turning and being careful about movements in bed. Pt also educated on calling in case of any questions or concerns. Will continue to monitor.

## 2012-05-22 NOTE — Progress Notes (Signed)
Bed search for SNF bed continues. Patient had hoped to be placed at [facility] but there are no vacancies and none anticipated. Patient's 2nd choice is [facility]. This facility is "considering patient but the Director of Nursing feels that she needs to come and assess patient prior to making a decision due to patient's nephrostomy tube and multiple medical conditions.  Notified patient's son- Susy Frizzle and Dr. Arthor Captain of above.Will notify pt's nurse.  The [facility] Nursing for [facility] plans to come and see patient either this evening of first thing in the morning. CSW will follow up with facility for hopeful d/c to SNF tomorrow.  If [facility] unable [facility]- patient will have to choose from other SNF offers.  Patient's son verbalized an understanding of this.  Lorri Frederick. [place]  [phone]

## 2012-05-23 DIAGNOSIS — N201 Calculus of ureter: Secondary | ICD-10-CM

## 2012-05-23 DIAGNOSIS — E876 Hypokalemia: Secondary | ICD-10-CM

## 2012-05-23 LAB — BASIC METABOLIC PANEL
BUN: 12 mg/dL (ref 6–23)
Calcium: 9 mg/dL (ref 8.4–10.5)
GFR calc non Af Amer: 60 mL/min — ABNORMAL LOW (ref >90)
Glucose, Bld: 107 mg/dL — ABNORMAL HIGH (ref 70–99)
Sodium: 140 mEq/L (ref 135–145)

## 2012-05-23 MED ORDER — POTASSIUM CHLORIDE CRYS ER 20 MEQ PO TBCR
60.0000 meq | EXTENDED_RELEASE_TABLET | Freq: Once | ORAL | Status: AC
  Administered 2012-05-23: 60 meq via ORAL
  Filled 2012-05-23: qty 3

## 2012-05-23 MED ORDER — CEFUROXIME AXETIL 500 MG PO TABS
500.0000 mg | ORAL_TABLET | Freq: Once | ORAL | Status: AC
  Administered 2012-05-23: 500 mg via ORAL
  Filled 2012-05-23: qty 1

## 2012-05-23 NOTE — Progress Notes (Signed)
   Subjective: Denies any new complaints, son is at bedside.   Objective: General: Alert and awake, oriented x3, not in any acute distress. HEENT: anicteric sclera, pupils reactive to light and accommodation, EOMI CVS: S1-S2 clear, no murmur rubs or gallops Chest: clear to auscultation bilaterally, no wheezing, rales or rhonchi Abdomen: soft nontender, nondistended, normal bowel sounds, no organomegaly Extremities: no cyanosis, clubbing or edema noted bilaterally Neuro: Cranial nerves II-XII intact, no focal neurological deficits   Assessment and plan:  Severe sepsis  Proteus mirabilis bacteremia  Right-sided hydronephrosis with nephrolithiasis  Demand ischemia  No changes clinically since yesterday. Patient is a discharge see discharge dictated yesterday.  [facility] brief progress note  Rachael Greer Pager: [phone] 05/23/2012, 1:03 PM

## 2012-05-23 NOTE — Progress Notes (Signed)
Rachael Greer to be D/C'd Skilled nursing facility per MD order.  Discussed with the patient and all questions fully answered.   [name], [name]  Home Medication Instructions RUE:[id number]   Printed on:05/23/12 1514  Medication Information                    pentosan polysulfate (ELMIRON) 100 MG capsule Take 100 mg by mouth 2 (two) times daily.           omeprazole (PRILOSEC) 40 MG capsule Take 40 mg by mouth daily.           amLODipine (NORVASC) 5 MG tablet Take 7.5 mg by mouth daily.           diazepam (VALIUM) 5 MG tablet Take 5 mg by mouth 2 (two) times daily as needed. For anxiety           ferrous sulfate 325 (65 FE) MG tablet Take 325 mg by mouth daily.           cefUROXime (CEFTIN) 500 MG tablet Take 1 tablet (500 mg total) by mouth 2 (two) times daily.             VVS, Skin clean, dry and intact without evidence of skin break down, no evidence of skin tears noted. IV catheter discontinued intact. Site without signs and symptoms of complications. Dressing and pressure applied.  An After Visit Summary was printed and given to the patient to give to SNF.Patient escorted via [facility], and D/C to SNF by private Renato Shin, RN 05/23/2012 3:14 PM

## 2012-05-29 DIAGNOSIS — N200 Calculus of kidney: Secondary | ICD-10-CM | POA: Insufficient documentation

## 2012-05-30 ENCOUNTER — Emergency Department (HOSPITAL_BASED_OUTPATIENT_CLINIC_OR_DEPARTMENT_OTHER)
Admission: EM | Admit: 2012-05-30 | Discharge: 2012-05-30 | Disposition: A | Discharge status: Home / Self Care | Payer: PRIVATE HEALTH INSURANCE | Attending: Emergency Medicine | Admitting: Emergency Medicine

## 2012-05-30 ENCOUNTER — Encounter (HOSPITAL_BASED_OUTPATIENT_CLINIC_OR_DEPARTMENT_OTHER): Payer: Self-pay | Admitting: *Deleted

## 2012-05-30 DIAGNOSIS — Z87448 Personal history of other diseases of urinary system: Secondary | ICD-10-CM | POA: Insufficient documentation

## 2012-05-30 DIAGNOSIS — T8389XA Other specified complication of genitourinary prosthetic devices, implants and grafts, initial encounter: Secondary | ICD-10-CM | POA: Insufficient documentation

## 2012-05-30 DIAGNOSIS — Z936 Other artificial openings of urinary tract status: Secondary | ICD-10-CM | POA: Insufficient documentation

## 2012-05-30 DIAGNOSIS — I10 Essential (primary) hypertension: Secondary | ICD-10-CM | POA: Insufficient documentation

## 2012-05-30 DIAGNOSIS — Z79899 Other long term (current) drug therapy: Secondary | ICD-10-CM | POA: Insufficient documentation

## 2012-05-30 DIAGNOSIS — K219 Gastro-esophageal reflux disease without esophagitis: Secondary | ICD-10-CM | POA: Insufficient documentation

## 2012-05-30 DIAGNOSIS — T83092A Other mechanical complication of nephrostomy catheter, initial encounter: Secondary | ICD-10-CM

## 2012-05-30 DIAGNOSIS — Y833 Surgical operation with formation of external stoma as the cause of abnormal reaction of the patient, or of later complication, without mention of misadventure at the time of the procedure: Secondary | ICD-10-CM | POA: Insufficient documentation

## 2012-05-30 DIAGNOSIS — R1031 Right lower quadrant pain: Secondary | ICD-10-CM | POA: Insufficient documentation

## 2012-05-30 DIAGNOSIS — Z853 Personal history of malignant neoplasm of breast: Secondary | ICD-10-CM | POA: Insufficient documentation

## 2012-05-30 HISTORY — DX: Sepsis, unspecified organism: A41.9

## 2012-05-30 NOTE — ED Notes (Signed)
Nephrostomy tube noted to have sediment clogging flow to bag. Tube milked to move sediment down to drainage bag and tube flushed with 10 ml NS by Sue Lush, RN. Pt tolerated well. Nephrostomy tube now draining dark, yellow urine with bits of sediment.

## 2012-05-30 NOTE — ED Notes (Signed)
Pt recently d/c'd from [facility] for urinary sepsis and now c/o urinary retention. Pt c/o RLQ pain and diff urinating. Denies fever.

## 2012-05-30 NOTE — ED Notes (Signed)
Pt report given to Darl Pikes at [facility].

## 2012-05-30 NOTE — ED Notes (Signed)
MD at bedside. 

## 2012-05-30 NOTE — ED Provider Notes (Signed)
History     CSN: [id number]  Arrival date & time 05/30/12  0028   First MD Initiated Contact with Patient 05/30/12 0105      Chief Complaint  Patient presents with  . 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 She was released with a right nephrostomy tube as a temporizing measure pending definitive removal of the stone tomorrow (by Dr. Logan Bores). She is here because her nephrostomy tube became occluded several hours ago. This resulted in severe right flank pain. The occlusion was caused by cloudy, puslike material. The tube was cleared of obstruction by nursing staff prior to my evaluation the patient; this was done by milking the thick material out of the tube. The nephrostomy tube is now draining clear urine and the patient's pain has been relieved. She is not febrile. She is being treated with Ceftin.  Past Medical History  Diagnosis Date  . Breast cancer   . Interstitial cystitis   . Gastric reflux   . Hypertension   . Sepsis     Past Surgical History  Procedure Date  . Nephrostomy     History reviewed. No pertinent family history.  History  Substance Use Topics  . Smoking status: Never Smoker   . Smokeless tobacco: Not on file  . Alcohol Use: No    OB History    Grav Para Term Preterm Abortions TAB SAB Ect Mult Living                  Review of Systems  All other systems reviewed and are negative.    Allergies  Review of patient's allergies indicates no known allergies.  Home Medications   Current Outpatient Rx  Name  Route  Sig  Dispense  Refill  . AMLODIPINE BESYLATE 5 MG PO TABS   Oral   Take 7.5 mg by mouth daily.         Marland Kitchen CEFUROXIME AXETIL 500 MG PO TABS   Oral   Take 1 tablet (500 mg total) by mouth 2 (two) times daily.   20 tablet   0   . DIAZEPAM 5 MG PO TABS   Oral   Take 5 mg by mouth 2 (two) times daily as needed. For anxiety         . FERROUS SULFATE 325 (65 FE) MG PO TABS   Oral   Take 325 mg by mouth daily.         Marland Kitchen OMEPRAZOLE 40 MG PO CPDR   Oral   Take 40 mg by mouth daily.         Marland Kitchen PENTOSAN POLYSULFATE SODIUM 100 MG PO CAPS   Oral   Take 100 mg by mouth 2 (two) times daily.           BP 126/56  Pulse 92  Temp 97.7 F (36.5 C) (Oral)  Resp 18  Ht 5\' 3"  (1.6 m)  Wt 138 lb (62.596 kg)  BMI 24.45 kg/m2  SpO2 99%  Physical Exam General: Well-developed, well-nourished female in no acute distress; appearance consistent with age of record HENT: normocephalic, atraumatic Eyes: pupils equal round and reactive to light; extraocular muscles intact Neck: supple Heart: regular rate and rhythm Lungs: clear to auscultation bilaterally Abdomen: soft; nondistended; bowel sounds present GU: Right nephrostomy tube draining clear orange urine; mild to moderate right CVA tenderness Extremities: No  deformity; full range of motion; pulses normal Neurologic: Awake, alert and oriented; motor function intact in all extremities and symmetric; no facial droop Skin: Warm and dry Psychiatric: Normal mood and affect    ED Course  Procedures (including critical care time)     MDM  We'll send urine for culture. As noted she is scheduled to have definitive surgery tomorrow.        Hanley Seamen, MD 05/30/12 [phone]

## 2012-05-31 LAB — URINE CULTURE

## 2012-06-07 DIAGNOSIS — N200 Calculus of kidney: Secondary | ICD-10-CM | POA: Insufficient documentation

## 2013-01-09 DIAGNOSIS — R319 Hematuria, unspecified: Secondary | ICD-10-CM | POA: Insufficient documentation

## 2013-01-23 DIAGNOSIS — C4492 Squamous cell carcinoma of skin, unspecified: Secondary | ICD-10-CM

## 2013-01-23 HISTORY — DX: Squamous cell carcinoma of skin, unspecified: C44.92

## 2013-02-06 ENCOUNTER — Emergency Department (HOSPITAL_BASED_OUTPATIENT_CLINIC_OR_DEPARTMENT_OTHER): Payer: PRIVATE HEALTH INSURANCE

## 2013-02-06 ENCOUNTER — Emergency Department (HOSPITAL_BASED_OUTPATIENT_CLINIC_OR_DEPARTMENT_OTHER)
Admission: EM | Admit: 2013-02-06 | Discharge: 2013-02-06 | Disposition: A | Discharge status: Home / Self Care | Payer: PRIVATE HEALTH INSURANCE | Attending: Emergency Medicine | Admitting: Emergency Medicine

## 2013-02-06 ENCOUNTER — Encounter (HOSPITAL_BASED_OUTPATIENT_CLINIC_OR_DEPARTMENT_OTHER): Payer: Self-pay | Admitting: *Deleted

## 2013-02-06 DIAGNOSIS — I10 Essential (primary) hypertension: Secondary | ICD-10-CM | POA: Insufficient documentation

## 2013-02-06 DIAGNOSIS — Z853 Personal history of malignant neoplasm of breast: Secondary | ICD-10-CM | POA: Insufficient documentation

## 2013-02-06 DIAGNOSIS — Z8742 Personal history of other diseases of the female genital tract: Secondary | ICD-10-CM | POA: Insufficient documentation

## 2013-02-06 DIAGNOSIS — Z79899 Other long term (current) drug therapy: Secondary | ICD-10-CM | POA: Insufficient documentation

## 2013-02-06 DIAGNOSIS — Z8619 Personal history of other infectious and parasitic diseases: Secondary | ICD-10-CM | POA: Insufficient documentation

## 2013-02-06 DIAGNOSIS — N390 Urinary tract infection, site not specified: Secondary | ICD-10-CM | POA: Insufficient documentation

## 2013-02-06 DIAGNOSIS — K219 Gastro-esophageal reflux disease without esophagitis: Secondary | ICD-10-CM | POA: Insufficient documentation

## 2013-02-06 DIAGNOSIS — R109 Unspecified abdominal pain: Secondary | ICD-10-CM | POA: Insufficient documentation

## 2013-02-06 LAB — CBC WITH DIFFERENTIAL/PLATELET
Hemoglobin: 13.2 g/dL (ref 12.0–15.0)
Lymphs Abs: 1.7 10*3/uL (ref 0.7–4.0)
Monocytes Relative: 10 % (ref 3–12)
Neutro Abs: 3.5 10*3/uL (ref 1.7–7.7)
Neutrophils Relative %: 59 % (ref 43–77)
RBC: 4.5 MIL/uL (ref 3.87–5.11)
WBC: 5.9 10*3/uL (ref 4.0–10.5)

## 2013-02-06 LAB — URINALYSIS, ROUTINE W REFLEX MICROSCOPIC
Nitrite: NEGATIVE
Specific Gravity, Urine: 1.017 (ref 1.005–1.030)
pH: 5.5 (ref 5.0–8.0)

## 2013-02-06 LAB — URINE MICROSCOPIC-ADD ON

## 2013-02-06 LAB — BASIC METABOLIC PANEL
BUN: 15 mg/dL (ref 6–23)
Chloride: 101 mEq/L (ref 96–112)
GFR calc Af Amer: 64 mL/min — ABNORMAL LOW (ref >90)
Glucose, Bld: 104 mg/dL — ABNORMAL HIGH (ref 70–99)
Potassium: 3.6 mEq/L (ref 3.5–5.1)

## 2013-02-06 MED ORDER — KETOROLAC TROMETHAMINE 30 MG/ML IJ SOLN
60.0000 mg | Freq: Once | INTRAMUSCULAR | Status: AC
  Administered 2013-02-06: 60 mg via INTRAMUSCULAR
  Filled 2013-02-06: qty 2

## 2013-02-06 MED ORDER — CEFPODOXIME PROXETIL 200 MG PO TABS: 200.0000 mg | ORAL_TABLET | Freq: Two times a day (BID) | ORAL | Status: AC

## 2013-02-06 MED ORDER — MORPHINE SULFATE 4 MG/ML IJ SOLN: 4.0000 mg | Freq: Once | INTRAMUSCULAR | Status: DC

## 2013-02-06 MED ORDER — CEFUROXIME AXETIL 500 MG PO TABS: 500.0000 mg | ORAL_TABLET | Freq: Two times a day (BID) | ORAL | Status: DC

## 2013-02-06 MED ORDER — MORPHINE SULFATE 4 MG/ML IJ SOLN: 6.0000 mg | Freq: Once | INTRAMUSCULAR | Status: DC

## 2013-02-06 NOTE — ED Provider Notes (Signed)
CSN: [id number]     Arrival date & time 02/06/13  1049 History   First MD Initiated Contact with Patient 02/06/13 1111     Chief Complaint  Patient presents with  . Flank Pain   (Consider location/radiation/quality/duration/timing/severity/associated sxs/prior Treatment) HPI Comments: R flank pain since last night.  Pt has recent hx of urosepsis leading ro cardiac arrest due to large obstructing R sided stone and states she had 4 removed by her urologist at [facility] last month.   Patient is a 72 y.o. female presenting with flank pain. The history is provided by the patient. No language interpreter was used.  Flank Pain This is a recurrent problem. The current episode started yesterday. The problem occurs constantly. The problem has been gradually worsening. Pertinent negatives include no chest pain, no abdominal pain, no headaches and no shortness of breath. The symptoms are aggravated by twisting and walking. Nothing relieves the symptoms. Treatments tried: hydrocodone. The treatment provided mild relief.    Past Medical History  Diagnosis Date  . Breast cancer   . Interstitial cystitis   . Gastric reflux   . Hypertension   . Sepsis    Past Surgical History  Procedure Laterality Date  . Nephrostomy     History reviewed. No pertinent family history. History  Substance Use Topics  . Smoking status: Never Smoker   . Smokeless tobacco: Not on file  . Alcohol Use: No   OB History   Grav Para Term Preterm Abortions TAB SAB Ect Mult Living                 Review of Systems  Constitutional: Negative for fever, chills, diaphoresis, activity change, appetite change and fatigue.  HENT: Negative for congestion, sore throat, facial swelling, rhinorrhea, neck pain and neck stiffness.   Eyes: Negative for photophobia and discharge.  Respiratory: Negative for cough, chest tightness and shortness of breath.   Cardiovascular: Negative for chest pain, palpitations and leg swelling.   Gastrointestinal: Negative for nausea, vomiting, abdominal pain and diarrhea.  Endocrine: Negative for polydipsia and polyuria.  Genitourinary: Positive for flank pain. Negative for dysuria, frequency, difficulty urinating and pelvic pain.  Musculoskeletal: Negative for back pain and arthralgias.  Skin: Negative for color change and wound.  Allergic/Immunologic: Negative for immunocompromised state.  Neurological: Negative for facial asymmetry, weakness, numbness and headaches.  Hematological: Does not bruise/bleed easily.  Psychiatric/Behavioral: Negative for confusion and agitation.    Allergies  Review of patient's allergies indicates no known allergies.  Home Medications   Current Outpatient Rx  Name  Route  Sig  Dispense  Refill  . HYDROCODONE-ACETAMINOPHEN PO   Oral   Take by mouth.         . sulfamethoxazole-trimethoprim (BACTRIM,SEPTRA) 400-80 MG per tablet   Oral   Take 1 tablet by mouth 2 (two) times daily.         Marland Kitchen amLODipine (NORVASC) 5 MG tablet   Oral   Take 7.5 mg by mouth daily.         . cefpodoxime (VANTIN) 200 MG tablet   Oral   Take 1 tablet (200 mg total) by mouth 2 (two) times daily.   14 tablet   0   . cefUROXime (CEFTIN) 500 MG tablet   Oral   Take 1 tablet (500 mg total) by mouth 2 (two) times daily.   20 tablet   0   . diazepam (VALIUM) 5 MG tablet   Oral   Take 5 mg by mouth  2 (two) times daily as needed. For anxiety         . ferrous sulfate 325 (65 FE) MG tablet   Oral   Take 325 mg by mouth daily.         Marland Kitchen omeprazole (PRILOSEC) 40 MG capsule   Oral   Take 40 mg by mouth daily.         . pentosan polysulfate (ELMIRON) 100 MG capsule   Oral   Take 100 mg by mouth 2 (two) times daily.          BP 113/62  Pulse 78  Temp(Src) 97.8 F (36.6 C) (Oral)  Resp 20  Ht 5\' 3"  (1.6 m)  Wt 148 lb (67.132 kg)  BMI 26.22 kg/m2  SpO2 99% Physical Exam  Constitutional: She is oriented to person, place, and time. She  appears well-developed and well-nourished. No distress.  HENT:  Head: Normocephalic and atraumatic.  Mouth/Throat: No oropharyngeal exudate.  Eyes: Pupils are equal, round, and reactive to light.  Neck: Normal range of motion. Neck supple.  Cardiovascular: Normal rate, regular rhythm and normal heart sounds.  Exam reveals no gallop and no friction rub.   No murmur heard. Pulmonary/Chest: Effort normal and breath sounds normal. No respiratory distress. She has no wheezes. She has no rales.  Abdominal: Soft. Bowel sounds are normal. She exhibits no distension and no mass. There is no tenderness. There is no rebound and no guarding.  R low CVA tenderness  Musculoskeletal: Normal range of motion. She exhibits no edema and no tenderness.  Neurological: She is alert and oriented to person, place, and time.  Skin: Skin is warm and dry.  Psychiatric: She has a normal mood and affect.    ED Course  Procedures (including critical care time) Labs Review Labs Reviewed  URINALYSIS, ROUTINE W REFLEX MICROSCOPIC - Abnormal; Notable for the following:    Leukocytes, UA TRACE (*)    All other components within normal limits  BASIC METABOLIC PANEL - Abnormal; Notable for the following:    Glucose, Bld 104 (*)    Calcium 10.9 (*)    GFR calc non Af Amer 55 (*)    GFR calc Af Amer 64 (*)    All other components within normal limits  URINE MICROSCOPIC-ADD ON - Abnormal; Notable for the following:    Casts HYALINE CASTS (*)    All other components within normal limits  CBC WITH DIFFERENTIAL   Imaging Review Ct Abdomen Pelvis Wo Contrast  02/06/2013   *RADIOLOGY REPORT*  Clinical Data: Right-sided flank pain.  CT ABDOMEN AND PELVIS WITHOUT CONTRAST  Technique:  Multidetector CT imaging of the abdomen and pelvis was performed following the standard protocol without intravenous contrast.  Comparison: CT of the abdomen and pelvis 05/18/2012.  Findings:  Lung Bases: Status post left mastectomy.  A 3 mm  left lower lobe nodule (image three of series four).  3 mm right lower lobe nodule (image 12 of series 4).  Abdomen/Pelvis:  There are two small calculi in the left renal collecting system, largest of which is in the lower pole measuring 3 mm.  No additional calculi are noted within the right renal collecting system, along the course of either ureter, or within the lumen of the urinary bladder.  No hydroureteronephrosis to suggest urinary tract obstruction at this time.  No definite focal renal lesions are appreciated on today's noncontrast CT examination.  The unenhanced appearance of the liver, gallbladder, pancreas and bilateral adrenal glands is  unremarkable.  The spleen appears borderline enlarged measuring 12.5 x 6.5 x 10.0 cm (estimated splenic volume of 406 ml).  Normal appendix.  No significant volume of ascites.  No pneumoperitoneum.  No pathologic distension of small bowel.  No definite pathologic lymphadenopathy within the abdomen or pelvis on today's noncontrast CT examination.  Uterus and ovaries appear atrophic.  Urinary bladder is unremarkable in appearance.  Musculoskeletal: There are no aggressive appearing lytic or blastic lesions noted in the visualized portions of the skeleton.  IMPRESSION: 1.  No acute findings in the abdomen or pelvis to account for the patient's right-sided flank pain. 2.  There are two nonobstructive calculi in the left renal collecting system, largest of which measures only 3 mm.  No ureteral stones or findings of urinary tract obstruction at this time. 3.  Borderline splenomegaly. 4.  Normal appendix. 5.  3 mm nodules in the lower lobes of the lungs bilaterally. These are highly nonspecific. If the patient is at high risk for bronchogenic carcinoma, follow-up chest CT at 1 year is recommended.  If the patient is at low risk, no follow-up is needed.  This recommendation follows the consensus statement: Guidelines for Management of Small Pulmonary Nodules Detected on CT  Scans:  A Statement from the Fleischner Society as published in Radiology 2005; 237:395-400. 6.  Status post left mastectomy.   Original Report Authenticated By: Trudie Reed, M.D.    MDM   1. Right flank pain   2. UTI (lower urinary tract infection)    Pt is a 72 y.o. female with Pmhx as above who presents with R flank pain since last night.  Pt has recent hx of urosepsis leading ro cardiac arrest due to large obstructing R sided stone. No n/v, fever, chills, dysuria, gross, hematuria, abdominal pain.  +ttp low R flank.  Larey Seat down one step 2 weeks ago, but has otherwise had no recent injuries. W/U shows CT w/ no acute R sided pathology.  Several small non-obstructing stones on left.  UA w/ trace leukocytes, but is on bactrim ppx at home.  Cr nml.  Given prior complications stemming from UTI/obstruction, will treat likely UTI with PO vantin.  Pt will call her urologist, Dr. Logan Bores, to discuss her bactrim rx.  Return precautions given for new or worsening symptoms including n/v, fever, worsening pain. Pt also informed of BL pulm nodules, though has no risk factors for bronchogenic carcinoma.  She will f/u this up with her PCP.  1. Right flank pain   2. UTI (lower urinary tract infection)         Shanna Cisco, MD 02/06/13 [phone]

## 2013-02-06 NOTE — ED Notes (Signed)
Right flank pain.

## 2013-03-26 DIAGNOSIS — E785 Hyperlipidemia, unspecified: Secondary | ICD-10-CM | POA: Insufficient documentation

## 2013-03-26 DIAGNOSIS — R918 Other nonspecific abnormal finding of lung field: Secondary | ICD-10-CM | POA: Insufficient documentation

## 2013-09-02 ENCOUNTER — Other Ambulatory Visit: Payer: Self-pay | Admitting: *Deleted

## 2013-09-02 DIAGNOSIS — R002 Palpitations: Secondary | ICD-10-CM

## 2013-09-02 NOTE — Progress Notes (Signed)
Requested by Dr A. Willis fax number [phone] ATTN Francene Boyers, RN

## 2013-09-04 ENCOUNTER — Encounter (INDEPENDENT_AMBULATORY_CARE_PROVIDER_SITE_OTHER): Payer: PRIVATE HEALTH INSURANCE

## 2013-09-04 ENCOUNTER — Encounter: Payer: Self-pay | Admitting: *Deleted

## 2013-09-04 DIAGNOSIS — R002 Palpitations: Secondary | ICD-10-CM

## 2013-09-04 NOTE — Progress Notes (Signed)
Patient ID: Rachael Greer, female   DOB: February 14, 1941, 73 y.o.   MRN: [id number] EVO 48 hour holter monitor applied to patient.

## 2014-02-25 ENCOUNTER — Institutional Professional Consult (permissible substitution): Payer: PRIVATE HEALTH INSURANCE | Admitting: Cardiology

## 2014-02-25 DIAGNOSIS — M797 Fibromyalgia: Secondary | ICD-10-CM

## 2014-02-25 HISTORY — DX: Fibromyalgia: M79.7

## 2014-03-18 DIAGNOSIS — H93A3 Pulsatile tinnitus, bilateral: Secondary | ICD-10-CM | POA: Insufficient documentation

## 2014-04-01 ENCOUNTER — Ambulatory Visit (INDEPENDENT_AMBULATORY_CARE_PROVIDER_SITE_OTHER): Payer: PRIVATE HEALTH INSURANCE | Admitting: Cardiology

## 2014-04-01 ENCOUNTER — Encounter: Payer: Self-pay | Admitting: *Deleted

## 2014-04-01 ENCOUNTER — Encounter: Payer: Self-pay | Admitting: Cardiology

## 2014-04-01 VITALS — BP 110/62 | HR 74 | Ht 63.0 in | Wt 153.0 lb

## 2014-04-01 DIAGNOSIS — R002 Palpitations: Secondary | ICD-10-CM

## 2014-04-01 DIAGNOSIS — I10 Essential (primary) hypertension: Secondary | ICD-10-CM

## 2014-04-01 DIAGNOSIS — I5032 Chronic diastolic (congestive) heart failure: Secondary | ICD-10-CM

## 2014-04-01 DIAGNOSIS — R0600 Dyspnea, unspecified: Secondary | ICD-10-CM

## 2014-04-01 MED ORDER — AMLODIPINE BESYLATE 5 MG PO TABS: 5.0000 mg | ORAL_TABLET | Freq: Every day | ORAL | Status: DC

## 2014-04-01 MED ORDER — METOPROLOL SUCCINATE ER 25 MG PO TB24: 25.0000 mg | ORAL_TABLET | Freq: Every day | ORAL | Status: DC

## 2014-04-01 NOTE — Assessment & Plan Note (Signed)
Etiology unclear. Schedule stress echocardiogram to quantify LV function and to exclude ischemia. She does have nonspecific ST changes on electrocardiogram.

## 2014-04-01 NOTE — Assessment & Plan Note (Signed)
Patient states she did have symptoms with her previous Holter monitor in place. This showed PACs, PVCs and PAT. We will add Toprol 25 mg by mouth each bedtime to see if symptoms improve. She apparently had recent lab work done including TSH. I will have those records sent to [name].

## 2014-04-01 NOTE — Assessment & Plan Note (Signed)
We are adding Toprol for palpitations. I will therefore decrease Norvasc to 5 mg daily. Follow blood pressure and adjust as needed.

## 2014-04-01 NOTE — Progress Notes (Signed)
     HPI: 73 year old female for evaluation of palpitations. Echocardiogram December 2013 showed normal LV function, grade 1 diastolic dysfunction and trace mitral regurgitation.Holter monitor April 2015 showed sinus rhythm with PACs, PVCs and brief PAT. Patient describes dyspnea on exertion for approximately 6 months. No orthopnea, PND, pedal edema, chest pain or syncope. She has had palpitations for approximately 4 years. She describes these as a pounding sensation in her head. She feels her heart beat. She also occasionally feels brief flutter. She did have the symptoms with a monitor in place.  Current Outpatient Prescriptions  Medication Sig Dispense Refill  . amitriptyline (ELAVIL) 10 MG tablet Take 10 mg by mouth at bedtime.      Marland Kitchen amLODipine (NORVASC) 5 MG tablet Take 10 mg by mouth daily.       Marland Kitchen atorvastatin (LIPITOR) 20 MG tablet Take 20 mg by mouth daily.      Marland Kitchen LORazepam (ATIVAN) 1 MG tablet Take 1 mg by mouth every 8 (eight) hours. 1/2 TO 1 TABLET TWICE DAILY IF NEEDED      . omeprazole (PRILOSEC) 40 MG capsule Take 40 mg by mouth daily.      . pentosan polysulfate (ELMIRON) 100 MG capsule Take 200 mg by mouth 2 (two) times daily.       Marland Kitchen sulfamethoxazole-trimethoprim (BACTRIM,SEPTRA) 400-80 MG per tablet Take 1 tablet by mouth 2 (two) times daily.       No current facility-administered medications for this visit.    No Known Allergies  Past Medical History  Diagnosis Date  . Breast cancer   . Interstitial cystitis   . Gastric reflux   . Hypertension   . Sepsis   . Hyperlipidemia   . Nephrolithiasis     Past Surgical History  Procedure Laterality Date  . Nephrostomy    . Tonsillectomy      History   Social History  . Marital Status: Widowed    Spouse Name: N/A    Number of Children: 4  . Years of Education: N/A   Occupational History  . Not on file.   Social History Main Topics  . Smoking status: Never Smoker   . Smokeless tobacco: Not on file  .  Alcohol Use: No  . Drug Use: No  . Sexual Activity:    Other Topics Concern  . Not on file   Social History Narrative  . No narrative on file    Family History  Problem Relation Age of Onset  . CAD Father     ROS: no fevers or chills, productive cough, hemoptysis, dysphasia, odynophagia, melena, hematochezia, dysuria, hematuria, rash, seizure activity, orthopnea, PND, pedal edema, claudication. Remaining systems are negative.  Physical Exam:   Blood pressure 110/62, pulse 74, height 5\' 3"  (1.6 m), weight 153 lb (69.4 kg).  General:  Well developed/well nourished in NAD Skin warm/dry Patient not depressed No peripheral clubbing Back-normal HEENT-normal/normal eyelids Neck supple/normal carotid upstroke bilaterally; no bruits; no JVD; no thyromegaly chest - CTA/ normal expansion CV - RRR/normal S1 and S2; no murmurs, rubs or gallops;  PMI nondisplaced Abdomen -NT/ND, no HSM, no mass, + bowel sounds, no bruit 2+ femoral pulses, no bruits Ext-no edema, chords, 2+ DP Neuro-grossly nonfocal  ECG Sinus rhythm with occasional PAC. Nonspecific ST changes.

## 2014-04-01 NOTE — Patient Instructions (Addendum)
Your physician recommends that you schedule a follow-up appointment in: 8-12 Rachael Greer has requested that you have a stress echocardiogram. For further information please visit [email or web address]. Please follow instruction sheet as given.   DECREASE AMLODIPINE TO 5 MG ONCE DAILY  START METOPROLOL SUCC ER 25 MG ONCE DAILY AT BEDTIME     Exercise Stress Echocardiogram An exercise stress echocardiogram is a heart (cardiac) test used to check the function of your heart. This test may also be called an exercise stress echocardiography or stress echo. This stress test will check how well your heart muscle and valves are working and determine if your heart muscle is getting enough blood. You will exercise on a treadmill to naturally increase or stress the functioning of your heart.  An echocardiogram uses sound waves (ultrasound) to produce an image of your heart. If your heart does not work normally, it may indicate coronary artery disease with poor coronary blood supply. The coronary arteries are the arteries that bring blood and oxygen to your heart. LET [facility] CARE PROVIDER KNOW ABOUT:  Any allergies you have.  All medicines you are taking, including vitamins, herbs, eye drops, creams, and over-the-counter medicines.  Previous problems you or members of your family have had with the use of anesthetics.  Any blood disorders you have.  Previous surgeries you have had.  Medical conditions you have.  Possibility of pregnancy, if this applies. RISKS AND COMPLICATIONS Generally, this is a safe procedure. However, as with any procedure, complications can occur. Possible complications can include:  You develop pain or pressure in the following areas:  Chest.  Jaw or neck.  Between your shoulder blades.  Radiating down your left arm.  Dizziness or lightheadedness.  Shortness of breath.  Increased or irregular heartbeat.  Nausea or  vomiting.  Heart attack (rare). BEFORE THE PROCEDURE  Avoid all forms of caffeine for 24 hours before your test or as directed by your health care provider. This includes coffee, tea (even decaffeinated tea), caffeinated sodas, chocolate, cocoa, and certain pain medicines.  Follow your health care provider's instructions regarding eating and drinking before the test.  Take your medicines as directed at regular times with water unless instructed otherwise. Exceptions may include:  If you have diabetes, ask how you are to take your insulin or pills. It is common to adjust insulin dosing the morning of the test.  If you are taking beta-blocker medicines, it is important to talk to your health care provider about these medicines well before the date of your test. Taking beta-blocker medicines may interfere with the test. In some cases, these medicines need to be changed or stopped 24 hours or more before the test.  If you wear a nitroglycerin patch, it may need to be removed prior to the test. Ask your health care provider if the patch should be removed before the test.  If you use an inhaler for any breathing condition, bring it with you to the test.  If you are an outpatient, bring a snack so you can eat right after the stress phase of the test.  Do not smoke for 4 hours prior to the test or as directed by your health care provider.  Wear loose-fitting clothes and comfortable shoes for the test. This test involves walking on a treadmill. PROCEDURE   Multiple electrodes will be put on your chest. If needed, small areas of your chest may be shaved to get better contact with the electrodes.  Once the electrodes are attached to your body, multiple wires will be attached to the electrodes, and your heart rate will be monitored.  You will have an echocardiogram done at rest.  To produce this image of your heart, gel is applied to your chest, and a wand-like tool (transducer) is moved over the  chest. The transducer sends the sound waves through the chest to create the moving images of your heart.  You may need an IV to receive a medication that improves the quality of the pictures.  You will then walk on a treadmill. The treadmill will be started at a slow pace. The treadmill speed and incline will gradually be increased to raise your heart rate.  At the peak of exercise, the treadmill will be stopped. You will lie down immediately on a bed so that a second echocardiogram can be done to visualize your heart's motion with exercise.  The test usually takes 30-60 minutes to complete. AFTER THE PROCEDURE  Your heart rate and blood pressure will be monitored after the test.  You may return to your normal schedule, including diet, activities, and medicines, unless your health care provider tells you otherwise. Document Released: 06/02/2004 Document Revised: 06/03/2013 Document Reviewed: 02/03/2013 [facility] Patient Information 2015 [place], [place]. This information is not intended to replace advice given to you by your health care provider. Make sure you discuss any questions you have with your health care provider.

## 2014-04-16 ENCOUNTER — Telehealth: Payer: Self-pay | Admitting: Cardiology

## 2014-04-16 NOTE — Telephone Encounter (Signed)
New message      Pt is having a stress echo tomorrow----have a question about the medication she can/cannot take tomorrow

## 2014-04-16 NOTE — Telephone Encounter (Signed)
Spoke with pt, aware she can take all her meds prior to test per dr Stanford Breed

## 2014-04-17 ENCOUNTER — Ambulatory Visit (HOSPITAL_COMMUNITY): Payer: PRIVATE HEALTH INSURANCE | Attending: Cardiology

## 2014-04-17 DIAGNOSIS — I10 Essential (primary) hypertension: Secondary | ICD-10-CM | POA: Insufficient documentation

## 2014-04-17 DIAGNOSIS — R0602 Shortness of breath: Secondary | ICD-10-CM | POA: Diagnosis not present

## 2014-04-17 DIAGNOSIS — R0600 Dyspnea, unspecified: Secondary | ICD-10-CM

## 2014-04-17 DIAGNOSIS — E785 Hyperlipidemia, unspecified: Secondary | ICD-10-CM | POA: Insufficient documentation

## 2014-04-17 NOTE — Progress Notes (Signed)
Stress echo performed.

## 2014-05-04 ENCOUNTER — Ambulatory Visit: Payer: PRIVATE HEALTH INSURANCE | Admitting: Interventional Cardiology

## 2014-06-24 ENCOUNTER — Ambulatory Visit: Payer: PRIVATE HEALTH INSURANCE | Admitting: Cardiology

## 2015-01-18 DIAGNOSIS — R600 Localized edema: Secondary | ICD-10-CM | POA: Insufficient documentation

## 2015-01-18 DIAGNOSIS — G4700 Insomnia, unspecified: Secondary | ICD-10-CM | POA: Insufficient documentation

## 2015-01-18 HISTORY — DX: Insomnia, unspecified: G47.00

## 2016-03-09 DIAGNOSIS — R0600 Dyspnea, unspecified: Secondary | ICD-10-CM | POA: Diagnosis not present

## 2016-03-09 DIAGNOSIS — R1310 Dysphagia, unspecified: Secondary | ICD-10-CM | POA: Diagnosis not present

## 2016-03-09 DIAGNOSIS — R0609 Other forms of dyspnea: Secondary | ICD-10-CM | POA: Diagnosis not present

## 2016-03-09 DIAGNOSIS — I10 Essential (primary) hypertension: Secondary | ICD-10-CM | POA: Diagnosis not present

## 2016-03-09 DIAGNOSIS — R079 Chest pain, unspecified: Secondary | ICD-10-CM | POA: Diagnosis not present

## 2016-03-10 ENCOUNTER — Telehealth: Payer: Self-pay | Admitting: Cardiology

## 2016-03-10 NOTE — Telephone Encounter (Signed)
Records received from [facility] for apt on 04/05/16 with Dr Stanford Breed . Records given to [employer] (medical records) CN

## 2016-03-27 NOTE — Progress Notes (Signed)
HPI: FU palpitations. Echocardiogram December 2013 showed normal LV function, grade 1 diastolic dysfunction and trace mitral regurgitation. Holter monitor April 2015 showed sinus rhythm with PACs, PVCs and brief PAT. Stress echocardiogram November 2015 was normal.  Since last seen, she has multiple complaints including dyspnea. She notes occasional chest pain that is sharp and substernal lasting several seconds. No radiation and not exertional. She describes feeling her heart beat "hard". She notes fatigue, bilateral jaw pain with singing.  Current Outpatient Prescriptions  Medication Sig Dispense Refill  . amLODipine (NORVASC) 5 MG tablet Take 1 tablet (5 mg total) by mouth daily. 30 tablet 12  . atorvastatin (LIPITOR) 20 MG tablet Take 20 mg by mouth daily.    . clonazePAM (KLONOPIN) 1 MG tablet Take 1 mg by mouth as directed.     . metoprolol succinate (TOPROL XL) 25 MG 24 hr tablet Take 1 tablet (25 mg total) by mouth daily. 30 tablet 12  . omeprazole (PRILOSEC) 40 MG capsule Take 40 mg by mouth daily.    . pentosan polysulfate (ELMIRON) 100 MG capsule Take 200 mg by mouth 2 (two) times daily.     Marland Kitchen sulfamethoxazole-trimethoprim (BACTRIM,SEPTRA) 400-80 MG per tablet Take 1 tablet by mouth 2 (two) times daily.     No current facility-administered medications for this visit.      Past Medical History:  Diagnosis Date  . Anxiety   . Breast cancer ([place])   . Chest pain   . DOE (dyspnea on exertion)   . Gastric reflux   . Hyperlipidemia   . Hypertension   . Interstitial cystitis   . Nephrolithiasis   . Nephrolithiasis   . Palpitations   . Sepsis [facility])     Past Surgical History:  Procedure Laterality Date  . MASTECTOMY Left   . NEPHROSTOMY    . TONSILLECTOMY      Social History   Social History  . Marital status: Widowed    Spouse name: N/A  . Number of children: 4  . Years of education: N/A   Occupational History  . Not on file.   Social History Main Topics    . Smoking status: Never Smoker  . Smokeless tobacco: Never Used  . Alcohol use No  . Drug use: No  . Sexual activity: Not on file   Other Topics Concern  . Not on file   Social History Narrative  . No narrative on file    Family History  Problem Relation Age of Onset  . CAD Father   . Heart attack Father   . Dementia Mother     ROS: Fatigue but no fevers or chills, productive cough, hemoptysis, dysphasia, odynophagia, melena, hematochezia, dysuria, hematuria, rash, seizure activity, orthopnea, PND, pedal edema, claudication. Remaining systems are negative.  Physical Exam: Well-developed well-nourished in no acute distress.  Skin is warm and dry.  HEENT is normal.  Neck is supple.  Chest is clear to auscultation with normal expansion.  Cardiovascular exam is regular rate and rhythm.  Abdominal exam nontender or distended. No masses palpated. Extremities show no edema. neuro grossly intact  ECG-sinus rhythm with no ST changes.  A/P  1 hypertension-blood pressure controlled. Continue present medications.  2 palpitations-symptoms are reasonably well controlled. Continue beta blocker.  3 hyperlipidemia-continue statin. Lipids and liver monitored by primary care.  4 chest pain/dyspnea-agents chest pain is extremely atypical. Electrocardiogram shows no ST changes. Previous stress test normal. I will not pursue further ischemia evaluation. I  will arrange an echocardiogram to reassess LV function.   Kirk Ruths, MD

## 2016-04-05 ENCOUNTER — Ambulatory Visit (INDEPENDENT_AMBULATORY_CARE_PROVIDER_SITE_OTHER): Payer: Medicare Other | Admitting: Cardiology

## 2016-04-05 ENCOUNTER — Encounter: Payer: Self-pay | Admitting: Cardiology

## 2016-04-05 VITALS — BP 125/74 | HR 64 | Ht 63.0 in | Wt 166.1 lb

## 2016-04-05 DIAGNOSIS — R0609 Other forms of dyspnea: Secondary | ICD-10-CM

## 2016-04-05 DIAGNOSIS — I10 Essential (primary) hypertension: Secondary | ICD-10-CM | POA: Diagnosis not present

## 2016-04-05 DIAGNOSIS — R0600 Dyspnea, unspecified: Secondary | ICD-10-CM

## 2016-04-05 DIAGNOSIS — R072 Precordial pain: Secondary | ICD-10-CM

## 2016-04-05 DIAGNOSIS — R002 Palpitations: Secondary | ICD-10-CM

## 2016-04-05 MED ORDER — METOPROLOL SUCCINATE ER 25 MG PO TB24: 25.0000 mg | ORAL_TABLET | Freq: Every day | ORAL | 3 refills | Status: DC

## 2016-04-05 NOTE — Patient Instructions (Signed)
Medication Instructions:   METOPROLOL REFILL SENT TO THE PHARMACY  Testing/Procedures:  Your physician has requested that you have an echocardiogram. Echocardiography is a painless test that uses sound waves to create images of your heart. It provides your doctor with information about the size and shape of your heart and how well your heart's chambers and valves are working. This procedure takes approximately one hour. There are no restrictions for this procedure.    Follow-Up:  Your physician wants you to follow-up in: [place] will receive a reminder letter in the mail two months in advance. If you don't receive a letter, please call our office to schedule the follow-up appointment.   If you need a refill on your cardiac medications before your next appointment, please call your pharmacy.

## 2016-04-10 DIAGNOSIS — Z1231 Encounter for screening mammogram for malignant neoplasm of breast: Secondary | ICD-10-CM | POA: Diagnosis not present

## 2016-04-18 DIAGNOSIS — M8580 Other specified disorders of bone density and structure, unspecified site: Secondary | ICD-10-CM | POA: Diagnosis not present

## 2016-04-18 DIAGNOSIS — I10 Essential (primary) hypertension: Secondary | ICD-10-CM | POA: Diagnosis not present

## 2016-04-19 ENCOUNTER — Ambulatory Visit (HOSPITAL_BASED_OUTPATIENT_CLINIC_OR_DEPARTMENT_OTHER): Admission: RE | Admit: 2016-04-19 | Payer: Medicare Other | Source: Ambulatory Visit

## 2016-04-25 DIAGNOSIS — M25511 Pain in right shoulder: Secondary | ICD-10-CM | POA: Diagnosis not present

## 2016-04-25 DIAGNOSIS — R718 Other abnormality of red blood cells: Secondary | ICD-10-CM | POA: Diagnosis not present

## 2016-04-25 DIAGNOSIS — R51 Headache: Secondary | ICD-10-CM | POA: Diagnosis not present

## 2016-04-25 DIAGNOSIS — R739 Hyperglycemia, unspecified: Secondary | ICD-10-CM | POA: Diagnosis not present

## 2016-04-28 DIAGNOSIS — R1310 Dysphagia, unspecified: Secondary | ICD-10-CM | POA: Diagnosis not present

## 2016-04-28 DIAGNOSIS — K219 Gastro-esophageal reflux disease without esophagitis: Secondary | ICD-10-CM | POA: Diagnosis not present

## 2016-05-25 DIAGNOSIS — K2960 Other gastritis without bleeding: Secondary | ICD-10-CM | POA: Diagnosis not present

## 2016-05-25 DIAGNOSIS — R1310 Dysphagia, unspecified: Secondary | ICD-10-CM | POA: Diagnosis not present

## 2016-05-25 DIAGNOSIS — K2940 Chronic atrophic gastritis without bleeding: Secondary | ICD-10-CM | POA: Diagnosis not present

## 2016-06-13 DIAGNOSIS — Z1211 Encounter for screening for malignant neoplasm of colon: Secondary | ICD-10-CM | POA: Diagnosis not present

## 2016-06-13 DIAGNOSIS — Z1212 Encounter for screening for malignant neoplasm of rectum: Secondary | ICD-10-CM | POA: Diagnosis not present

## 2016-07-04 DIAGNOSIS — N323 Diverticulum of bladder: Secondary | ICD-10-CM | POA: Diagnosis not present

## 2016-07-04 DIAGNOSIS — Z87442 Personal history of urinary calculi: Secondary | ICD-10-CM | POA: Diagnosis not present

## 2016-07-04 DIAGNOSIS — M797 Fibromyalgia: Secondary | ICD-10-CM | POA: Diagnosis not present

## 2016-07-04 DIAGNOSIS — N3010 Interstitial cystitis (chronic) without hematuria: Secondary | ICD-10-CM | POA: Diagnosis not present

## 2016-07-04 DIAGNOSIS — N200 Calculus of kidney: Secondary | ICD-10-CM | POA: Diagnosis not present

## 2016-07-07 DIAGNOSIS — Z79899 Other long term (current) drug therapy: Secondary | ICD-10-CM | POA: Diagnosis not present

## 2016-07-07 DIAGNOSIS — H5212 Myopia, left eye: Secondary | ICD-10-CM | POA: Diagnosis not present

## 2016-07-07 DIAGNOSIS — Z853 Personal history of malignant neoplasm of breast: Secondary | ICD-10-CM | POA: Diagnosis not present

## 2016-07-07 DIAGNOSIS — Z9012 Acquired absence of left breast and nipple: Secondary | ICD-10-CM | POA: Diagnosis not present

## 2016-07-07 DIAGNOSIS — R0789 Other chest pain: Secondary | ICD-10-CM | POA: Diagnosis not present

## 2016-07-07 DIAGNOSIS — E7800 Pure hypercholesterolemia, unspecified: Secondary | ICD-10-CM | POA: Diagnosis not present

## 2016-07-07 DIAGNOSIS — R61 Generalized hyperhidrosis: Secondary | ICD-10-CM | POA: Diagnosis not present

## 2016-07-07 DIAGNOSIS — I10 Essential (primary) hypertension: Secondary | ICD-10-CM | POA: Diagnosis not present

## 2016-07-07 DIAGNOSIS — E785 Hyperlipidemia, unspecified: Secondary | ICD-10-CM | POA: Diagnosis not present

## 2016-07-07 DIAGNOSIS — R079 Chest pain, unspecified: Secondary | ICD-10-CM | POA: Insufficient documentation

## 2016-07-07 DIAGNOSIS — R0602 Shortness of breath: Secondary | ICD-10-CM | POA: Diagnosis not present

## 2016-07-07 DIAGNOSIS — H52222 Regular astigmatism, left eye: Secondary | ICD-10-CM | POA: Diagnosis not present

## 2016-07-07 DIAGNOSIS — N3010 Interstitial cystitis (chronic) without hematuria: Secondary | ICD-10-CM | POA: Diagnosis not present

## 2016-07-07 DIAGNOSIS — H5201 Hypermetropia, right eye: Secondary | ICD-10-CM | POA: Diagnosis not present

## 2016-07-07 DIAGNOSIS — H04123 Dry eye syndrome of bilateral lacrimal glands: Secondary | ICD-10-CM | POA: Diagnosis not present

## 2016-07-07 DIAGNOSIS — H524 Presbyopia: Secondary | ICD-10-CM | POA: Diagnosis not present

## 2016-07-08 DIAGNOSIS — N3010 Interstitial cystitis (chronic) without hematuria: Secondary | ICD-10-CM | POA: Diagnosis not present

## 2016-07-08 DIAGNOSIS — R079 Chest pain, unspecified: Secondary | ICD-10-CM | POA: Diagnosis not present

## 2016-07-08 DIAGNOSIS — E875 Hyperkalemia: Secondary | ICD-10-CM | POA: Diagnosis not present

## 2016-07-08 DIAGNOSIS — I10 Essential (primary) hypertension: Secondary | ICD-10-CM | POA: Diagnosis not present

## 2016-07-20 ENCOUNTER — Telehealth (HOSPITAL_COMMUNITY): Payer: Self-pay | Admitting: Radiology

## 2016-07-20 ENCOUNTER — Encounter (HOSPITAL_COMMUNITY): Payer: Self-pay | Admitting: Radiology

## 2016-07-20 NOTE — Telephone Encounter (Signed)
Called to schedule echocardiogram 

## 2016-07-24 DIAGNOSIS — E559 Vitamin D deficiency, unspecified: Secondary | ICD-10-CM | POA: Diagnosis not present

## 2016-07-24 DIAGNOSIS — R739 Hyperglycemia, unspecified: Secondary | ICD-10-CM | POA: Diagnosis not present

## 2016-07-24 DIAGNOSIS — R718 Other abnormality of red blood cells: Secondary | ICD-10-CM | POA: Diagnosis not present

## 2016-07-24 DIAGNOSIS — Z0000 Encounter for general adult medical examination without abnormal findings: Secondary | ICD-10-CM | POA: Diagnosis not present

## 2016-07-27 DIAGNOSIS — R079 Chest pain, unspecified: Secondary | ICD-10-CM | POA: Diagnosis not present

## 2016-07-27 DIAGNOSIS — Z0000 Encounter for general adult medical examination without abnormal findings: Secondary | ICD-10-CM | POA: Diagnosis not present

## 2016-07-27 DIAGNOSIS — E559 Vitamin D deficiency, unspecified: Secondary | ICD-10-CM | POA: Diagnosis not present

## 2016-07-27 DIAGNOSIS — E785 Hyperlipidemia, unspecified: Secondary | ICD-10-CM | POA: Diagnosis not present

## 2016-07-27 DIAGNOSIS — M8580 Other specified disorders of bone density and structure, unspecified site: Secondary | ICD-10-CM | POA: Diagnosis not present

## 2016-07-31 ENCOUNTER — Encounter (HOSPITAL_COMMUNITY): Payer: Self-pay | Admitting: Radiology

## 2016-07-31 NOTE — Progress Notes (Signed)
Patient no showed for echocardiogram 10/17. Patient sent a letter 2/18. Unable to contact patient to schedule echocardiogram.

## 2016-08-23 ENCOUNTER — Other Ambulatory Visit: Payer: Self-pay | Admitting: Internal Medicine

## 2016-08-23 DIAGNOSIS — Z853 Personal history of malignant neoplasm of breast: Secondary | ICD-10-CM

## 2016-08-23 DIAGNOSIS — N6452 Nipple discharge: Secondary | ICD-10-CM

## 2016-08-23 DIAGNOSIS — N644 Mastodynia: Secondary | ICD-10-CM

## 2016-08-24 ENCOUNTER — Other Ambulatory Visit: Payer: Self-pay | Admitting: Internal Medicine

## 2016-08-24 DIAGNOSIS — Z853 Personal history of malignant neoplasm of breast: Secondary | ICD-10-CM

## 2016-08-24 DIAGNOSIS — N644 Mastodynia: Secondary | ICD-10-CM

## 2016-08-24 DIAGNOSIS — N6452 Nipple discharge: Secondary | ICD-10-CM

## 2016-08-30 ENCOUNTER — Ambulatory Visit
Admission: RE | Admit: 2016-08-30 | Discharge: 2016-08-30 | Disposition: A | Discharge status: Home / Self Care | Payer: Medicare Other | Source: Ambulatory Visit | Attending: Internal Medicine | Admitting: Internal Medicine

## 2016-08-30 DIAGNOSIS — Z853 Personal history of malignant neoplasm of breast: Secondary | ICD-10-CM

## 2016-08-30 DIAGNOSIS — N644 Mastodynia: Secondary | ICD-10-CM

## 2016-08-30 DIAGNOSIS — N6452 Nipple discharge: Secondary | ICD-10-CM | POA: Diagnosis not present

## 2016-08-30 DIAGNOSIS — R928 Other abnormal and inconclusive findings on diagnostic imaging of breast: Secondary | ICD-10-CM | POA: Diagnosis not present

## 2016-09-21 DIAGNOSIS — Z87440 Personal history of urinary (tract) infections: Secondary | ICD-10-CM | POA: Diagnosis not present

## 2016-09-21 DIAGNOSIS — I159 Secondary hypertension, unspecified: Secondary | ICD-10-CM | POA: Diagnosis not present

## 2016-09-21 DIAGNOSIS — G3184 Mild cognitive impairment, so stated: Secondary | ICD-10-CM | POA: Diagnosis not present

## 2016-09-21 DIAGNOSIS — N3010 Interstitial cystitis (chronic) without hematuria: Secondary | ICD-10-CM | POA: Diagnosis not present

## 2016-09-25 ENCOUNTER — Other Ambulatory Visit: Payer: Self-pay | Admitting: General Surgery

## 2016-09-25 DIAGNOSIS — N6452 Nipple discharge: Secondary | ICD-10-CM | POA: Diagnosis not present

## 2016-10-05 ENCOUNTER — Ambulatory Visit
Admission: RE | Admit: 2016-10-05 | Discharge: 2016-10-05 | Disposition: A | Discharge status: Home / Self Care | Payer: Medicare Other | Source: Ambulatory Visit | Attending: General Surgery | Admitting: General Surgery

## 2016-10-05 DIAGNOSIS — N6452 Nipple discharge: Secondary | ICD-10-CM

## 2016-10-05 DIAGNOSIS — N6489 Other specified disorders of breast: Secondary | ICD-10-CM | POA: Diagnosis not present

## 2016-10-05 MED ORDER — GADOBENATE DIMEGLUMINE 529 MG/ML IV SOLN: 15.0000 mL | Freq: Once | INTRAVENOUS | Status: DC | PRN

## 2016-10-05 MED ORDER — GADOBENATE DIMEGLUMINE 529 MG/ML IV SOLN
15.0000 mL | Freq: Once | INTRAVENOUS | Status: AC | PRN
  Administered 2016-10-05: 15 mL via INTRAVENOUS

## 2016-10-10 ENCOUNTER — Other Ambulatory Visit: Payer: Self-pay | Admitting: General Surgery

## 2016-10-16 ENCOUNTER — Other Ambulatory Visit: Payer: Self-pay | Admitting: General Surgery

## 2016-10-24 NOTE — Pre-Procedure Instructions (Addendum)
AELLA RONDA  10/24/2016      [facility] [place] [address] Phone: [phone] Fax: [phone]    Your procedure is scheduled on May 21  Report to [place] at 0930 A.M.  Call this number if you have problems the morning of surgery:  [phone]   Remember:  Do not eat food or drink liquids after midnight except follow the following instructions  Please complete your 8oz of Boost Breeze or Water that was given to you at your preadmission appointment by 0730am on the day of your surgery    Take these medicines the morning of surgery with A SIP OF WATER amLODipine (NORVASC),  citalopram (CELEXA), clonazePAM (KLONOPIN), metoprolol succinate (TOPROL XL), eye drops  7 days prior to surgery STOP taking any Aspirin, Aleve, Naproxen, Ibuprofen, Motrin, Advil, Goody's, BC's, all herbal medications, fish oil, and all vitamins    Do not wear jewelry, make-up or nail polish.  Do not wear lotions, powders, or perfumes, or deoderant.  Do not shave 48 hours prior to surgery.  Men may shave face and neck.  Do not bring valuables to the hospital.  [facility] is not responsible for any belongings or valuables.  Contacts, dentures or bridgework may not be worn into surgery.  Leave your suitcase in the car.  After surgery it may be brought to your room.  For patients admitted to the hospital, discharge time will be determined by your treatment team.  Patients discharged the day of surgery will not be allowed to drive home.    Special instructions:   - Preparing For Surgery  Before surgery, you can play an important role. Because skin is not sterile, your skin needs to be as free of germs as possible. You can reduce the number of germs on your skin by washing with CHG (chlorahexidine gluconate) Soap before surgery.  CHG is an antiseptic cleaner which kills germs  and bonds with the skin to continue killing germs even after washing.  Please do not use if you have an allergy to CHG or antibacterial soaps. If your skin becomes reddened/irritated stop using the CHG.  Do not shave (including legs and underarms) for at least 48 hours prior to first CHG shower. It is OK to shave your face.  Please follow these instructions carefully.   1. Shower the NIGHT BEFORE SURGERY and the MORNING OF SURGERY with CHG.   2. If you chose to wash your hair, wash your hair first as usual with your normal shampoo.  3. After you shampoo, rinse your hair and body thoroughly to remove the shampoo.  4. Use CHG as you would any other liquid soap. You can apply CHG directly to the skin and wash gently with a scrungie or a clean washcloth.   5. Apply the CHG Soap to your body ONLY FROM THE NECK DOWN.  Do not use on open wounds or open sores. Avoid contact with your eyes, ears, mouth and genitals (private parts). Wash genitals (private parts) with your normal soap.  6. Wash thoroughly, paying special attention to the area where your surgery will be performed.  7. Thoroughly rinse your body with warm water from the neck down.  8. DO NOT shower/wash with your normal soap after using and rinsing off the CHG Soap.  9. Pat yourself dry with a CLEAN TOWEL.   10. Wear  CLEAN PAJAMAS   11. Place CLEAN SHEETS on your bed the night of your first shower and DO NOT SLEEP WITH PETS.    Day of Surgery: Do not apply any deodorants/lotions. Please wear clean clothes to the hospital/surgery center.      Please read over the following fact sheets that you were given.

## 2016-10-25 ENCOUNTER — Encounter (HOSPITAL_COMMUNITY)
Admission: RE | Admit: 2016-10-25 | Discharge: 2016-10-25 | Disposition: A | Discharge status: Home / Self Care | Payer: Medicare Other | Source: Ambulatory Visit | Attending: General Surgery | Admitting: General Surgery

## 2016-10-25 ENCOUNTER — Encounter (HOSPITAL_COMMUNITY): Payer: Self-pay

## 2016-10-25 DIAGNOSIS — N6452 Nipple discharge: Secondary | ICD-10-CM | POA: Insufficient documentation

## 2016-10-25 DIAGNOSIS — Z01818 Encounter for other preprocedural examination: Secondary | ICD-10-CM | POA: Insufficient documentation

## 2016-10-25 HISTORY — DX: Prediabetes: R73.03

## 2016-10-25 HISTORY — DX: Irritable bowel syndrome, unspecified: K58.9

## 2016-10-25 HISTORY — DX: Personal history of urinary calculi: Z87.442

## 2016-10-25 HISTORY — DX: Major depressive disorder, single episode, unspecified: F32.9

## 2016-10-25 LAB — BASIC METABOLIC PANEL
Anion gap: 7 (ref 5–15)
BUN: 18 mg/dL (ref 6–20)
CHLORIDE: 107 mmol/L (ref 101–111)
CO2: 24 mmol/L (ref 22–32)
CREATININE: 0.88 mg/dL (ref 0.44–1.00)
Calcium: 10 mg/dL (ref 8.9–10.3)
GFR calc Af Amer: >60 mL/min (ref >60)
GFR calc non Af Amer: >60 mL/min (ref >60)
GLUCOSE: 86 mg/dL (ref 65–99)
POTASSIUM: 4 mmol/L (ref 3.5–5.1)
Sodium: 138 mmol/L (ref 135–145)

## 2016-10-25 LAB — CBC
HCT: 43.2 % (ref 36.0–46.0)
Hemoglobin: 14.2 g/dL (ref 12.0–15.0)
MCH: 27.5 pg (ref 26.0–34.0)
MCHC: 32.9 g/dL (ref 30.0–36.0)
MCV: 83.7 fL (ref 78.0–100.0)
Platelets: 207 10*3/uL (ref 150–400)
RBC: 5.16 MIL/uL — ABNORMAL HIGH (ref 3.87–5.11)
RDW: 13.8 % (ref 11.5–15.5)
WBC: 8.7 10*3/uL (ref 4.0–10.5)

## 2016-10-25 LAB — GLUCOSE, CAPILLARY: Glucose-Capillary: 103 mg/dL — ABNORMAL HIGH (ref 65–99)

## 2016-10-25 NOTE — Progress Notes (Signed)
PCP - [name] Cardiologist - [place]  Chest x-ray - not needec EKG - 04/05/16 Stress Test - 04/17/14 ECHO - 05/19/12 Cardiac Cath - denies    Fasting Blood Sugar - patient does not check her blood sugar at home she is prediabetic Will send to anesthesia for review of cardiac records and A1C   Patient denies shortness of breath, fever, cough and chest pain at PAT appointment   Patient verbalized understanding of instructions that were given to them at the PAT appointment. Patient was also instructed that they will need to review over the PAT instructions again at home before surgery.

## 2016-10-26 LAB — HEMOGLOBIN A1C
Hgb A1c MFr Bld: 5.3 % (ref 4.8–5.6)
Mean Plasma Glucose: 105 mg/dL

## 2016-10-30 ENCOUNTER — Ambulatory Visit (HOSPITAL_COMMUNITY)
Admission: RE | Admit: 2016-10-30 | Discharge: 2016-10-30 | Disposition: A | Discharge status: Home / Self Care | Payer: Medicare Other | Source: Ambulatory Visit | Attending: General Surgery | Admitting: General Surgery

## 2016-10-30 ENCOUNTER — Encounter (HOSPITAL_COMMUNITY)
Admission: RE | Disposition: A | Discharge status: Home / Self Care | Payer: Self-pay | Source: Ambulatory Visit | Attending: General Surgery

## 2016-10-30 ENCOUNTER — Ambulatory Visit (HOSPITAL_COMMUNITY): Payer: Medicare Other | Admitting: Certified Registered Nurse Anesthetist

## 2016-10-30 ENCOUNTER — Encounter (HOSPITAL_COMMUNITY): Payer: Self-pay | Admitting: Certified Registered Nurse Anesthetist

## 2016-10-30 DIAGNOSIS — N6041 Mammary duct ectasia of right breast: Secondary | ICD-10-CM | POA: Diagnosis not present

## 2016-10-30 DIAGNOSIS — R920 Mammographic microcalcification found on diagnostic imaging of breast: Secondary | ICD-10-CM | POA: Diagnosis not present

## 2016-10-30 DIAGNOSIS — E7800 Pure hypercholesterolemia, unspecified: Secondary | ICD-10-CM | POA: Insufficient documentation

## 2016-10-30 DIAGNOSIS — K219 Gastro-esophageal reflux disease without esophagitis: Secondary | ICD-10-CM | POA: Diagnosis not present

## 2016-10-30 DIAGNOSIS — D241 Benign neoplasm of right breast: Secondary | ICD-10-CM | POA: Insufficient documentation

## 2016-10-30 DIAGNOSIS — N186 End stage renal disease: Secondary | ICD-10-CM | POA: Diagnosis not present

## 2016-10-30 DIAGNOSIS — Z9012 Acquired absence of left breast and nipple: Secondary | ICD-10-CM | POA: Diagnosis not present

## 2016-10-30 DIAGNOSIS — I10 Essential (primary) hypertension: Secondary | ICD-10-CM | POA: Diagnosis not present

## 2016-10-30 DIAGNOSIS — F329 Major depressive disorder, single episode, unspecified: Secondary | ICD-10-CM | POA: Diagnosis not present

## 2016-10-30 DIAGNOSIS — N6452 Nipple discharge: Secondary | ICD-10-CM | POA: Diagnosis not present

## 2016-10-30 DIAGNOSIS — Z79899 Other long term (current) drug therapy: Secondary | ICD-10-CM | POA: Insufficient documentation

## 2016-10-30 DIAGNOSIS — N6011 Diffuse cystic mastopathy of right breast: Secondary | ICD-10-CM | POA: Diagnosis not present

## 2016-10-30 DIAGNOSIS — I509 Heart failure, unspecified: Secondary | ICD-10-CM | POA: Diagnosis not present

## 2016-10-30 DIAGNOSIS — N6081 Other benign mammary dysplasias of right breast: Secondary | ICD-10-CM | POA: Diagnosis not present

## 2016-10-30 DIAGNOSIS — F419 Anxiety disorder, unspecified: Secondary | ICD-10-CM | POA: Diagnosis not present

## 2016-10-30 DIAGNOSIS — Z853 Personal history of malignant neoplasm of breast: Secondary | ICD-10-CM | POA: Diagnosis not present

## 2016-10-30 HISTORY — PX: BREAST DUCTAL SYSTEM EXCISION: SHX5242

## 2016-10-30 SURGERY — EXCISION DUCTAL SYSTEM BREAST
Anesthesia: General | Site: Breast | Laterality: Right

## 2016-10-30 MED ORDER — GABAPENTIN 300 MG PO CAPS
300.0000 mg | ORAL_CAPSULE | ORAL | Status: AC
  Administered 2016-10-30: 300 mg via ORAL
  Filled 2016-10-30: qty 1

## 2016-10-30 MED ORDER — EPHEDRINE SULFATE-NACL 50-0.9 MG/10ML-% IV SOSY
PREFILLED_SYRINGE | INTRAVENOUS | Status: DC | PRN
  Administered 2016-10-30 (×4): 10 mg via INTRAVENOUS

## 2016-10-30 MED ORDER — PROPOFOL 10 MG/ML IV BOLUS
INTRAVENOUS | Status: DC | PRN
  Administered 2016-10-30: 140 mg via INTRAVENOUS

## 2016-10-30 MED ORDER — BUPIVACAINE HCL 0.25 % IJ SOLN
INTRAMUSCULAR | Status: DC | PRN
  Administered 2016-10-30: 10 mL

## 2016-10-30 MED ORDER — LACTATED RINGERS IV SOLN: INTRAVENOUS | Status: DC | PRN

## 2016-10-30 MED ORDER — ONDANSETRON HCL 4 MG/2ML IJ SOLN
INTRAMUSCULAR | Status: AC
  Filled 2016-10-30: qty 2

## 2016-10-30 MED ORDER — 0.9 % SODIUM CHLORIDE (POUR BTL) OPTIME
TOPICAL | Status: DC | PRN
  Administered 2016-10-30: 1000 mL

## 2016-10-30 MED ORDER — SODIUM CHLORIDE 0.9% FLUSH: 3.0000 mL | Freq: Two times a day (BID) | INTRAVENOUS | Status: DC

## 2016-10-30 MED ORDER — ACETAMINOPHEN 325 MG PO TABS
650.0000 mg | ORAL_TABLET | ORAL | Status: DC | PRN
Start: 2016-10-30 — End: 2016-10-30

## 2016-10-30 MED ORDER — MORPHINE SULFATE (PF) 2 MG/ML IV SOLN: 1.0000 mg | INTRAVENOUS | Status: DC | PRN

## 2016-10-30 MED ORDER — LIDOCAINE 2% (20 MG/ML) 5 ML SYRINGE
INTRAMUSCULAR | Status: DC | PRN
  Administered 2016-10-30: 100 mg via INTRAVENOUS

## 2016-10-30 MED ORDER — OXYCODONE HCL 5 MG PO TABS: 5.0000 mg | ORAL_TABLET | ORAL | Status: DC | PRN

## 2016-10-30 MED ORDER — PHENYLEPHRINE 40 MCG/ML (10ML) SYRINGE FOR IV PUSH (FOR BLOOD PRESSURE SUPPORT)
PREFILLED_SYRINGE | INTRAVENOUS | Status: AC
  Filled 2016-10-30: qty 10

## 2016-10-30 MED ORDER — SODIUM CHLORIDE 0.9% FLUSH: 3.0000 mL | INTRAVENOUS | Status: DC | PRN

## 2016-10-30 MED ORDER — ACETAMINOPHEN 500 MG PO TABS
1000.0000 mg | ORAL_TABLET | ORAL | Status: AC
  Administered 2016-10-30: 1000 mg via ORAL
  Filled 2016-10-30: qty 2

## 2016-10-30 MED ORDER — BUPIVACAINE HCL (PF) 0.25 % IJ SOLN
INTRAMUSCULAR | Status: AC
  Filled 2016-10-30: qty 30

## 2016-10-30 MED ORDER — EPHEDRINE 5 MG/ML INJ
INTRAVENOUS | Status: AC
  Filled 2016-10-30: qty 10

## 2016-10-30 MED ORDER — LACTATED RINGERS IV SOLN
INTRAVENOUS | Status: DC
Start: 2016-10-30 — End: 2016-10-30
  Administered 2016-10-30 (×2): via INTRAVENOUS

## 2016-10-30 MED ORDER — SODIUM CHLORIDE 0.9 % IV SOLN: 250.0000 mL | INTRAVENOUS | Status: DC | PRN

## 2016-10-30 MED ORDER — CEFAZOLIN SODIUM-DEXTROSE 2-4 GM/100ML-% IV SOLN
2.0000 g | INTRAVENOUS | Status: AC
  Administered 2016-10-30: 2 g via INTRAVENOUS
  Filled 2016-10-30: qty 100

## 2016-10-30 MED ORDER — FENTANYL CITRATE (PF) 100 MCG/2ML IJ SOLN
INTRAMUSCULAR | Status: DC | PRN
  Administered 2016-10-30 (×2): 25 ug via INTRAVENOUS

## 2016-10-30 MED ORDER — LIDOCAINE 2% (20 MG/ML) 5 ML SYRINGE
INTRAMUSCULAR | Status: AC
  Filled 2016-10-30: qty 5

## 2016-10-30 MED ORDER — ACETAMINOPHEN 650 MG RE SUPP: 650.0000 mg | RECTAL | Status: DC | PRN

## 2016-10-30 MED ORDER — PROPOFOL 10 MG/ML IV BOLUS
INTRAVENOUS | Status: AC
  Filled 2016-10-30: qty 20

## 2016-10-30 MED ORDER — ONDANSETRON HCL 4 MG/2ML IJ SOLN
INTRAMUSCULAR | Status: DC | PRN
  Administered 2016-10-30: 4 mg via INTRAVENOUS

## 2016-10-30 MED ORDER — FENTANYL CITRATE (PF) 250 MCG/5ML IJ SOLN
INTRAMUSCULAR | Status: AC
  Filled 2016-10-30: qty 5

## 2016-10-30 SURGICAL SUPPLY — 39 items
BENZOIN TINCTURE PRP APPL 2/3 (GAUZE/BANDAGES/DRESSINGS) ×2 IMPLANT
BINDER BREAST LRG (GAUZE/BANDAGES/DRESSINGS) ×2 IMPLANT
BLADE CLIPPER SURG (BLADE) IMPLANT
CANISTER SUCT 3000ML PPV (MISCELLANEOUS) IMPLANT
CHLORAPREP W/TINT 26ML (MISCELLANEOUS) ×2 IMPLANT
COVER SURGICAL LIGHT HANDLE (MISCELLANEOUS) ×2 IMPLANT
DERMABOND ADHESIVE PROPEN (GAUZE/BANDAGES/DRESSINGS) ×1
DERMABOND ADVANCED .7 DNX6 (GAUZE/BANDAGES/DRESSINGS) ×1 IMPLANT
DRAPE LAPAROTOMY 100X72 PEDS (DRAPES) ×2 IMPLANT
ELECT CAUTERY BLADE 6.4 (BLADE) ×2 IMPLANT
ELECT REM PT RETURN 9FT ADLT (ELECTROSURGICAL) ×2
ELECTRODE REM PT RTRN 9FT ADLT (ELECTROSURGICAL) ×1 IMPLANT
GLOVE BIO SURGEON STRL SZ7 (GLOVE) ×4 IMPLANT
GLOVE BIOGEL PI IND STRL 7.5 (GLOVE) ×1 IMPLANT
GLOVE BIOGEL PI INDICATOR 7.5 (GLOVE) ×1
GOWN STRL REUS W/ TWL LRG LVL3 (GOWN DISPOSABLE) ×2 IMPLANT
GOWN STRL REUS W/TWL LRG LVL3 (GOWN DISPOSABLE) ×2
KIT BASIN OR (CUSTOM PROCEDURE TRAY) ×2 IMPLANT
KIT ROOM TURNOVER OR (KITS) ×2 IMPLANT
NEEDLE HYPO 25GX1X1/2 BEV (NEEDLE) ×2 IMPLANT
NS IRRIG 1000ML POUR BTL (IV SOLUTION) ×2 IMPLANT
PACK GENERAL/GYN (CUSTOM PROCEDURE TRAY) ×2 IMPLANT
PACK SURGICAL SETUP 50X90 (CUSTOM PROCEDURE TRAY) IMPLANT
PAD ARMBOARD 7.5X6 YLW CONV (MISCELLANEOUS) ×2 IMPLANT
PENCIL BUTTON HOLSTER BLD 10FT (ELECTRODE) IMPLANT
SPONGE LAP 4X18 X RAY DECT (DISPOSABLE) IMPLANT
STRIP CLOSURE SKIN 1/2X4 (GAUZE/BANDAGES/DRESSINGS) ×2 IMPLANT
SUT MON AB 5-0 PS2 18 (SUTURE) ×2 IMPLANT
SUT SILK 2 0 SH (SUTURE) IMPLANT
SUT VIC AB 2-0 SH 27 (SUTURE) ×1
SUT VIC AB 2-0 SH 27XBRD (SUTURE) ×1 IMPLANT
SUT VIC AB 3-0 SH 27 (SUTURE) ×1
SUT VIC AB 3-0 SH 27X BRD (SUTURE) ×1 IMPLANT
SYR BULB 3OZ (MISCELLANEOUS) IMPLANT
SYR CONTROL 10ML LL (SYRINGE) ×2 IMPLANT
TOWEL OR 17X24 6PK STRL BLUE (TOWEL DISPOSABLE) ×2 IMPLANT
TOWEL OR 17X26 10 PK STRL BLUE (TOWEL DISPOSABLE) IMPLANT
TUBE CONNECTING 12X1/4 (SUCTIONS) IMPLANT
YANKAUER SUCT BULB TIP NO VENT (SUCTIONS) IMPLANT

## 2016-10-30 NOTE — Interval H&P Note (Signed)
History and Physical Interval Note:  10/30/2016 10:06 AM  Rachael Greer  has presented today for surgery, with the diagnosis of right breast discharge  The various methods of treatment have been discussed with the patient and family. After consideration of risks, benefits and other options for treatment, the patient has consented to  Procedure(s): RIGHT BREAST DUCT EXCISION (Right) as a surgical intervention .  The patient's history has been reviewed, patient examined, no change in status, stable for surgery.  I have reviewed the patient's chart and labs.  Questions were answered to the patient's satisfaction.     [name]

## 2016-10-30 NOTE — Discharge Instructions (Signed)
[facility] °Office Phone Number [phone] °POST OP INSTRUCTIONS ° °Always review your discharge instruction sheet given to you by the facility where your surgery was performed. ° °IF YOU HAVE DISABILITY OR FAMILY LEAVE FORMS, YOU MUST BRING THEM TO THE OFFICE FOR PROCESSING.  DO NOT GIVE THEM TO YOUR DOCTOR. ° °1. A prescription for pain medication may be given to you upon discharge.  Take your pain medication as prescribed, if needed.  If narcotic pain medicine is not needed, then you may take acetaminophen (Tylenol), naprosyn (Alleve) or ibuprofen (Advil) as needed. °2. Take your usually prescribed medications unless otherwise directed °3. If you need a refill on your pain medication, please contact your pharmacy.  They will contact our office to request authorization.  Prescriptions will not be filled after 5pm or on week-ends. °4. You should eat very light the first 24 hours after surgery, such as soup, crackers, pudding, etc.  Resume your normal diet the day after surgery. °5. Most patients will experience some swelling and bruising in the breast.  Ice packs and a good support bra will help.  Wear the breast binder provided or a sports bra for 72 hours day and night.  After that wear a sports bra during the day until you return to the office. Swelling and bruising can take several days to resolve.  °6. It is common to experience some constipation if taking pain medication after surgery.  Increasing fluid intake and taking a stool softener will usually help or prevent this problem from occurring.  A mild laxative (Milk of Magnesia or Miralax) should be taken according to package directions if there are no bowel movements after 48 hours. °7. Unless discharge instructions indicate otherwise, you may remove your bandages 48 hours after surgery and you may shower at that time.  You may have steri-strips (small skin tapes) in place directly over the incision.  These strips should be left on the  skin for 7-10 days and will come off on their own.  If your surgeon used skin glue on the incision, you may shower in 24 hours.  The glue will flake off over the next 2-3 weeks.  Any sutures or staples will be removed at the office during your follow-up visit. °8. ACTIVITIES:  You may resume regular daily activities (gradually increasing) beginning the next day.  Wearing a good support bra or sports bra minimizes pain and swelling.  You may have sexual intercourse when it is comfortable. °a. You may drive when you no longer are taking prescription pain medication, you can comfortably wear a seatbelt, and you can safely maneuver your car and apply brakes. °b. RETURN TO WORK:  ______________________________________________________________________________________ °9. You should see your doctor in the office for a follow-up appointment approximately two weeks after your surgery.  Your doctor’s nurse will typically make your follow-up appointment when she calls you with your pathology report.  Expect your pathology report 3-4 business days after your surgery.  You may call to check if you do not hear from us after three days. °10. OTHER INSTRUCTIONS: _______________________________________________________________________________________________ _____________________________________________________________________________________________________________________________________ °_____________________________________________________________________________________________________________________________________ °_____________________________________________________________________________________________________________________________________ ° °WHEN TO CALL DR Wendi Lastra: °1. Fever over 101.0 °2. Nausea and/or vomiting. °3. Extreme swelling or bruising. °4. Continued bleeding from incision. °5. Increased pain, redness, or drainage from the incision. ° °The clinic staff is available to answer your questions during regular  business hours.  Please don’t hesitate to call and ask to speak to one of the nurses for clinical concerns.  If you   have a medical emergency, go to the nearest emergency room or call 911.  A surgeon from [facility] is always on call at the hospital. ° °For further questions, please visit [email or web address] ° °

## 2016-10-30 NOTE — H&P (Signed)
76 yof referred by Dr Jetta Lout for spontaneous right bloody nipple dc. she has bra to show me today. 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 she noted this a month or so ago. she has undergone mm/us that show b density breasts and there are no abnormalities. MRI shows an indeterminate nme in superficial right breast that cannot be biopsied.   Past Surgical History  Breast Biopsy  Left. Breast Mass; Local Excision  Left. Cataract Surgery  Left. Colon Polyp Removal - Colonoscopy  Mastectomy  Left. Sentinel Lymph Node Biopsy  Tonsillectomy   Diagnostic Studies History  Colonoscopy  within last year Mammogram  within last year  Allergies  No Known Drug Allergies   Medication History  Elmiron (100MG  Capsule, Oral) Active. Metoprolol Succinate ER (25MG  Tablet ER 24HR, Oral) Active. Sulfamethoxazole-Trimethoprim (400-80MG  Tablet, Oral) Active. Citalopram Hydrobromide (20MG  Tablet, Oral) Active. Norvasc (10MG  Tablet, Oral) Active. Lipitor (10MG  Tablet, Oral) Active. Medications Reconciled  Social History  Caffeine use  Carbonated beverages. No alcohol use  No drug use  Tobacco use  Never smoker.  Family History  Arthritis  Father. Breast Cancer  Family Members In General. Heart Disease  Father. Heart disease in female family member before age 22  Hypertension  Father, Mother. Thyroid problems  Mother.  Pregnancy / Birth History  Age at menarche  56 years. Age of menopause  <45 Gravida  0 Para  0  Other Problems Anxiety Disorder  Back Pain  Bladder Problems  Breast Cancer  Chronic Renal Failure Syndrome  Depression  Gastroesophageal Reflux Disease  High blood pressure  Hypercholesterolemia  Kidney Stone  Lump In Breast   Review of Systems General Present- Fatigue. Not Present- Appetite Loss, Chills, Fever, Night Sweats, Weight Gain and Weight Loss. Skin Present- Dryness. Not Present- Change  in Wart/Mole, Hives, Jaundice, New Lesions, Non-Healing Wounds, Rash and Ulcer. HEENT Present- Hoarseness and Visual Disturbances. Not Present- Earache, Hearing Loss, Nose Bleed, Oral Ulcers, Ringing in the Ears, Seasonal Allergies, Sinus Pain, Sore Throat, Wears glasses/contact lenses and Yellow Eyes. Breast Present- Breast Mass, Nipple Discharge and Skin Changes. Not Present- Breast Pain. Cardiovascular Present- Rapid Heart Rate. Not Present- Chest Pain, Difficulty Breathing Lying Down, Leg Cramps, Palpitations, Shortness of Breath and Swelling of Extremities. Gastrointestinal Present- Chronic diarrhea and Difficulty Swallowing. Not Present- Abdominal Pain, Bloating, Bloody Stool, Change in Bowel Habits, Constipation, Excessive gas, Gets full quickly at meals, Hemorrhoids, Indigestion, Nausea, Rectal Pain and Vomiting. Female Genitourinary Present- Pelvic Pain. Not Present- Frequency, Nocturia, Painful Urination and Urgency. Musculoskeletal Present- Back Pain. Not Present- Joint Pain, Joint Stiffness, Muscle Pain, Muscle Weakness and Swelling of Extremities. Neurological Present- Decreased Memory and Weakness. Not Present- Fainting, Headaches, Numbness, Seizures, Tingling, Tremor and Trouble walking. Psychiatric Present- Anxiety and Depression. Not Present- Bipolar, Change in Sleep Pattern, Fearful and Frequent crying. Endocrine Present- New Diabetes. Not Present- Cold Intolerance, Excessive Hunger, Hair Changes, Heat Intolerance and Hot flashes. Hematology Not Present- Blood Thinners, Easy Bruising, Excessive bleeding, Gland problems, HIV and Persistent Infections.  Vitals  Weight: 143 lb Height: 62in Body Surface Area: 1.66 m Body Mass Index: 26.15 kg/m  Pulse: 63 (Regular)  BP: 124/78 (Sitting, Left Arm, Standard) Physical Exam  General Mental Status-Alert. Orientation-Oriented X3. Head and Neck Head-normocephalic, atraumatic with no lesions or palpable masses. Thyroid  -Note: no mass. Eye Sclera/Conjunctiva - Bilateral-No scleral icterus. ENMT Mouth and Throat -Note: op clear. Chest and Lung Exam Chest and lung exam reveals -quiet, even and easy  respiratory effort with no use of accessory muscles and on auscultation, normal breath sounds, no adventitious sounds and normal vocal resonance. Breast Nipples Discharge - Left - None. Note: none demonstrated today due to pain. Breast Lump-No Palpable Breast Mass. Cardiovascular Cardiovascular examination reveals -normal heart sounds, regular rate and rhythm with no murmurs. Neurologic Mental Status-Normal. Motor-Normal. Lymphatic Head & Neck General Head & Neck Lymphatics: Bilateral - Description - Normal. Axillary General Axillary Region: Bilateral - Description - Normal. Note: no [place] adenopathy   Assessment & Plan  NIPPLE DISCHARGE (N64.52) Right breast central duct excision- not really able to biopsy the area or mark it so will plan on doing directed duct excision.

## 2016-10-30 NOTE — Anesthesia Preprocedure Evaluation (Addendum)
Anesthesia Evaluation  Patient identified by MRN, date of birth, ID band Patient awake    Reviewed: Allergy & Precautions, NPO status , Patient's Chart, lab work & pertinent test results  Airway Mallampati: I  TM Distance: >3 FB Neck ROM: Full    Dental  (+) Dental Advisory Given, Teeth Intact   Pulmonary shortness of breath and with exertion,    Pulmonary exam normal        Cardiovascular hypertension, Pt. on medications and Pt. on home beta blockers Normal cardiovascular exam     Neuro/Psych PSYCHIATRIC DISORDERS Anxiety Depression    GI/Hepatic GERD  Medicated,  Endo/Other    Renal/GU      Musculoskeletal   Abdominal   Peds  Hematology   Anesthesia Other Findings   Reproductive/Obstetrics                           Anesthesia Physical Anesthesia Plan  ASA: II  Anesthesia Plan: General   Post-op Pain Management:    Induction: Intravenous  Airway Management Planned: LMA  Additional Equipment:   Intra-op Plan:   Post-operative Plan: Extubation in OR  Informed Consent: I have reviewed the patients History and Physical, chart, labs and discussed the procedure including the risks, benefits and alternatives for the proposed anesthesia with the patient or authorized representative who has indicated his/her understanding and acceptance.     Plan Discussed with: CRNA and Surgeon  Anesthesia Plan Comments:       Anesthesia Quick Evaluation

## 2016-10-30 NOTE — Progress Notes (Signed)
Call to Dr. Donne Hazel for approval to put IV in L arm ( past h/o of mastectomy of the L breast.)

## 2016-10-30 NOTE — Anesthesia Procedure Notes (Signed)
Procedure Name: LMA Insertion Date/Time: 10/30/2016 10:17 AM Performed by: Garrison [place] T Pre-anesthesia Checklist: Patient identified, Emergency Drugs available, Suction available and Patient being monitored Patient Re-evaluated:Patient Re-evaluated prior to inductionOxygen Delivery Method: Circle System Utilized Preoxygenation: Pre-oxygenation with 100% oxygen Intubation Type: IV induction Ventilation: Mask ventilation without difficulty LMA: LMA inserted LMA Size: 4.0 Number of attempts: 1 Airway Equipment and Method: Bite block Placement Confirmation: positive ETCO2 Tube secured with: Tape Dental Injury: Teeth and Oropharynx as per pre-operative assessment

## 2016-10-30 NOTE — Op Note (Signed)
Preoperative diagnoses: right breast bloody nipple discharge Postoperative diagnosis: Same as above Procedure:Rightbreast duct excision Surgeon: Dr. Serita Grammes Anesthesia: Gen. Estimated blood loss: minimal Complications: None Drains: None Specimens:Rightbreast tissue with paint Sponge and needle count correct at completion Disposition to recovery stable  Indications: This is a 84 yof who has prior history of left breast cancer.  She has single duct spontaneous right breast discharge that has been bloody.  She has mri with some nme that is not able to be biopsied.  We discussed a directed ductal excision due to discharge.    Procedure: After informed consent was obtained she was then taken to the operating room. She was given cefazolin. Sequential compression devices were on her legs. She was placed under general anesthesia without complication. Her rightbreast was then prepped and draped in the standard sterile surgical fashion. A surgical timeout was then performed.  I identified the discharging duct.  I placed a lacrimal duct probe. I then infiltrated marcaine throughout. I made a periareolar incision and the used cautery to identify the lacrimal duct probe. I then removed the entire draining ductal system with attention to the area that was noted on the mri.  This was removed and marked with paint. Hemostasis was observed.I closed the breast tissue with a 2-0 Vicryl. The dermis was closed with 3-0 Vicryl and the skin with 5-0 Monocryl.Dermabond and steristrips were placed on the incision. A breast binder was placed. She was transferred to recovery stable

## 2016-10-30 NOTE — Transfer of Care (Signed)
Immediate Anesthesia Transfer of Care Note  Patient: Rachael Greer  Procedure(s) Performed: Procedure(s): RIGHT BREAST DUCT EXCISION (Right)  Patient Location: PACU  Anesthesia Type:General  Level of Consciousness: awake, alert  and oriented  Airway & Oxygen Therapy: Patient Spontanous Breathing and Patient connected to nasal cannula oxygen  Post-op Assessment: Report given to RN, Post -op Vital signs reviewed and stable and Patient moving all extremities X 4  Post vital signs: Reviewed and stable  Last Vitals:  Vitals:   10/30/16 0820  BP: (!) 117/53  Pulse: (!) 53  Resp: 20  Temp: 36.6 C    Last Pain:  Vitals:   10/30/16 0820  TempSrc: Oral         Complications: No apparent anesthesia complications

## 2016-10-30 NOTE — Anesthesia Postprocedure Evaluation (Signed)
Anesthesia Post Note  Patient: Rachael Greer  Procedure(s) Performed: Procedure(s) (LRB): RIGHT BREAST DUCT EXCISION (Right)  Patient location during evaluation: PACU Anesthesia Type: General Level of consciousness: awake and alert Pain management: pain level controlled Vital Signs Assessment: post-procedure vital signs reviewed and stable Respiratory status: spontaneous breathing, nonlabored ventilation, respiratory function stable and patient connected to nasal cannula oxygen Cardiovascular status: blood pressure returned to baseline and stable Postop Assessment: no signs of nausea or vomiting Anesthetic complications: no       Last Vitals:  Vitals:   10/30/16 1130 10/30/16 1148  BP: (!) 118/50 116/60  Pulse: (!) 59 63  Resp: 10 16  Temp: 36.4 C     Last Pain:  Vitals:   10/30/16 0820  TempSrc: Oral                 [name]

## 2016-10-31 ENCOUNTER — Encounter (HOSPITAL_COMMUNITY): Payer: Self-pay | Admitting: General Surgery

## 2016-12-28 DIAGNOSIS — R413 Other amnesia: Secondary | ICD-10-CM | POA: Diagnosis not present

## 2016-12-28 DIAGNOSIS — Z79899 Other long term (current) drug therapy: Secondary | ICD-10-CM | POA: Diagnosis not present

## 2016-12-28 DIAGNOSIS — I10 Essential (primary) hypertension: Secondary | ICD-10-CM | POA: Diagnosis not present

## 2017-01-02 DIAGNOSIS — N9489 Other specified conditions associated with female genital organs and menstrual cycle: Secondary | ICD-10-CM | POA: Diagnosis not present

## 2017-01-02 DIAGNOSIS — N3010 Interstitial cystitis (chronic) without hematuria: Secondary | ICD-10-CM | POA: Diagnosis not present

## 2017-03-29 DIAGNOSIS — Z87442 Personal history of urinary calculi: Secondary | ICD-10-CM | POA: Insufficient documentation

## 2017-03-30 DIAGNOSIS — N3010 Interstitial cystitis (chronic) without hematuria: Secondary | ICD-10-CM | POA: Diagnosis not present

## 2017-03-30 DIAGNOSIS — Z87442 Personal history of urinary calculi: Secondary | ICD-10-CM | POA: Diagnosis not present

## 2017-03-30 DIAGNOSIS — M797 Fibromyalgia: Secondary | ICD-10-CM | POA: Diagnosis not present

## 2017-04-03 DIAGNOSIS — H66002 Acute suppurative otitis media without spontaneous rupture of ear drum, left ear: Secondary | ICD-10-CM | POA: Diagnosis not present

## 2017-04-12 DIAGNOSIS — H6982 Other specified disorders of Eustachian tube, left ear: Secondary | ICD-10-CM | POA: Diagnosis not present

## 2017-04-26 DIAGNOSIS — E559 Vitamin D deficiency, unspecified: Secondary | ICD-10-CM | POA: Diagnosis not present

## 2017-04-26 DIAGNOSIS — E785 Hyperlipidemia, unspecified: Secondary | ICD-10-CM | POA: Diagnosis not present

## 2017-04-26 DIAGNOSIS — I10 Essential (primary) hypertension: Secondary | ICD-10-CM | POA: Diagnosis not present

## 2017-04-26 DIAGNOSIS — M810 Age-related osteoporosis without current pathological fracture: Secondary | ICD-10-CM | POA: Diagnosis not present

## 2017-04-26 DIAGNOSIS — Z5181 Encounter for therapeutic drug level monitoring: Secondary | ICD-10-CM | POA: Diagnosis not present

## 2017-04-26 DIAGNOSIS — M8580 Other specified disorders of bone density and structure, unspecified site: Secondary | ICD-10-CM | POA: Diagnosis not present

## 2017-04-27 DIAGNOSIS — Z5181 Encounter for therapeutic drug level monitoring: Secondary | ICD-10-CM | POA: Diagnosis not present

## 2017-05-01 DIAGNOSIS — Z0000 Encounter for general adult medical examination without abnormal findings: Secondary | ICD-10-CM | POA: Diagnosis not present

## 2017-05-01 DIAGNOSIS — R413 Other amnesia: Secondary | ICD-10-CM | POA: Diagnosis not present

## 2017-05-05 DIAGNOSIS — H6982 Other specified disorders of Eustachian tube, left ear: Secondary | ICD-10-CM | POA: Insufficient documentation

## 2017-05-09 DIAGNOSIS — H6983 Other specified disorders of Eustachian tube, bilateral: Secondary | ICD-10-CM | POA: Diagnosis not present

## 2017-05-09 DIAGNOSIS — H6502 Acute serous otitis media, left ear: Secondary | ICD-10-CM | POA: Diagnosis not present

## 2017-05-14 DIAGNOSIS — H6993 Unspecified Eustachian tube disorder, bilateral: Secondary | ICD-10-CM | POA: Diagnosis not present

## 2017-05-14 DIAGNOSIS — H6502 Acute serous otitis media, left ear: Secondary | ICD-10-CM | POA: Diagnosis not present

## 2017-07-04 DIAGNOSIS — H6992 Unspecified Eustachian tube disorder, left ear: Secondary | ICD-10-CM | POA: Diagnosis not present

## 2017-07-05 DIAGNOSIS — N3010 Interstitial cystitis (chronic) without hematuria: Secondary | ICD-10-CM | POA: Diagnosis not present

## 2017-07-05 DIAGNOSIS — N200 Calculus of kidney: Secondary | ICD-10-CM | POA: Diagnosis not present

## 2017-07-05 DIAGNOSIS — Z87440 Personal history of urinary (tract) infections: Secondary | ICD-10-CM | POA: Diagnosis not present

## 2017-07-17 DIAGNOSIS — H9113 Presbycusis, bilateral: Secondary | ICD-10-CM | POA: Insufficient documentation

## 2017-07-17 DIAGNOSIS — H6983 Other specified disorders of Eustachian tube, bilateral: Secondary | ICD-10-CM | POA: Diagnosis not present

## 2017-07-17 DIAGNOSIS — J320 Chronic maxillary sinusitis: Secondary | ICD-10-CM | POA: Diagnosis not present

## 2017-07-17 DIAGNOSIS — H6993 Unspecified Eustachian tube disorder, bilateral: Secondary | ICD-10-CM

## 2017-07-17 HISTORY — DX: Other specified disorders of eustachian tube, bilateral: H69.83

## 2017-07-17 HISTORY — DX: Unspecified eustachian tube disorder, bilateral: H69.93

## 2017-07-26 DIAGNOSIS — R413 Other amnesia: Secondary | ICD-10-CM | POA: Diagnosis not present

## 2017-07-26 DIAGNOSIS — Z0000 Encounter for general adult medical examination without abnormal findings: Secondary | ICD-10-CM | POA: Diagnosis not present

## 2017-07-26 DIAGNOSIS — Z79899 Other long term (current) drug therapy: Secondary | ICD-10-CM | POA: Diagnosis not present

## 2017-07-26 DIAGNOSIS — Z5181 Encounter for therapeutic drug level monitoring: Secondary | ICD-10-CM | POA: Diagnosis not present

## 2017-07-27 DIAGNOSIS — R0781 Pleurodynia: Secondary | ICD-10-CM | POA: Diagnosis not present

## 2017-07-27 DIAGNOSIS — S299XXA Unspecified injury of thorax, initial encounter: Secondary | ICD-10-CM | POA: Diagnosis not present

## 2017-08-01 DIAGNOSIS — H9203 Otalgia, bilateral: Secondary | ICD-10-CM | POA: Diagnosis not present

## 2017-08-01 DIAGNOSIS — H938X3 Other specified disorders of ear, bilateral: Secondary | ICD-10-CM | POA: Diagnosis not present

## 2017-08-17 DIAGNOSIS — H9202 Otalgia, left ear: Secondary | ICD-10-CM | POA: Diagnosis not present

## 2017-08-27 DIAGNOSIS — H6983 Other specified disorders of Eustachian tube, bilateral: Secondary | ICD-10-CM | POA: Diagnosis not present

## 2017-08-27 DIAGNOSIS — R42 Dizziness and giddiness: Secondary | ICD-10-CM | POA: Diagnosis not present

## 2017-08-28 DIAGNOSIS — R42 Dizziness and giddiness: Secondary | ICD-10-CM | POA: Insufficient documentation

## 2017-09-03 DIAGNOSIS — Z1231 Encounter for screening mammogram for malignant neoplasm of breast: Secondary | ICD-10-CM | POA: Diagnosis not present

## 2017-09-11 DIAGNOSIS — R928 Other abnormal and inconclusive findings on diagnostic imaging of breast: Secondary | ICD-10-CM | POA: Diagnosis not present

## 2017-09-11 DIAGNOSIS — N6001 Solitary cyst of right breast: Secondary | ICD-10-CM | POA: Diagnosis not present

## 2017-10-30 DIAGNOSIS — H04123 Dry eye syndrome of bilateral lacrimal glands: Secondary | ICD-10-CM | POA: Diagnosis not present

## 2017-10-30 DIAGNOSIS — Z961 Presence of intraocular lens: Secondary | ICD-10-CM | POA: Diagnosis not present

## 2017-10-30 DIAGNOSIS — H10413 Chronic giant papillary conjunctivitis, bilateral: Secondary | ICD-10-CM | POA: Diagnosis not present

## 2017-11-02 ENCOUNTER — Ambulatory Visit (INDEPENDENT_AMBULATORY_CARE_PROVIDER_SITE_OTHER): Payer: Medicare Other | Admitting: Allergy & Immunology

## 2017-11-02 ENCOUNTER — Encounter: Payer: Self-pay | Admitting: Allergy & Immunology

## 2017-11-02 VITALS — BP 122/70 | HR 82 | Temp 98.7°F | Resp 18 | Ht 63.0 in | Wt 149.0 lb

## 2017-11-02 DIAGNOSIS — J310 Chronic rhinitis: Secondary | ICD-10-CM | POA: Diagnosis not present

## 2017-11-02 DIAGNOSIS — H938X3 Other specified disorders of ear, bilateral: Secondary | ICD-10-CM

## 2017-11-02 DIAGNOSIS — B999 Unspecified infectious disease: Secondary | ICD-10-CM

## 2017-11-02 MED ORDER — FLUTICASONE PROPIONATE 93 MCG/ACT NA EXHU: 2.0000 | INHALANT_SUSPENSION | Freq: Two times a day (BID) | NASAL | 12 refills | Status: DC

## 2017-11-02 MED ORDER — AZELASTINE HCL 0.15 % NA SOLN: NASAL | 5 refills | Status: DC

## 2017-11-02 NOTE — Patient Instructions (Addendum)
1. Recurrent infections - We are going to get some lab work to make sure your immune system is working well. - I am concerned about your pneumonias and sepsis episodes.  - We will call you in 1-2 weeks with the results of the testing.   2. Chronic rhinitis - Testing today showed: negative to the entire panel - Continue with: Astelin (azelastine) 2 sprays per nostril 1-2 times daily as needed - Start taking: Xhance (fluticasone) 1-2 sprays per nostril twice daily - Consider nasal saline rinses 1-2 times daily to remove allergens from the nasal cavities as well as help with mucous clearance (this is especially helpful to do before the nasal sprays are given) - Let us know how this new nose spray is working.   3. Sensation of fullness in both ears - We will refer you to see Dr. Will Bonnet (ENT) for a third opinion. - He might want to do a CT scan of your sinuses to make sure that the anatomy looks good.  4. Return in about 2 months (around 01/02/2018).  Please inform us of any Emergency Department visits, hospitalizations, or changes in symptoms. Call us before going to the ED for breathing or allergy symptoms since we might be able to fit you in for a sick visit. Feel free to contact us anytime with any questions, problems, or concerns.  It was a pleasure to meet you today!  Websites that have reliable patient information: 1. American Academy of Asthma, Allergy, and Immunology: www.aaaai.org 2. Food Allergy Research and Education (FARE): foodallergy.org 3. Mothers of Asthmatics: http://www.asthmacommunitynetwork.org 4. American College of Allergy, Asthma, and Immunology: MonthlyElectricBill.co.uk   Make sure you are registered to vote!

## 2017-11-02 NOTE — Progress Notes (Signed)
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 Recurrent infections - We are going to get some lab work to make sure your immune system is working well. - I am concerned about your pneumonias and sepsis episodes.  - We will call you in 1-2 weeks with the results of the testing.   2. Chronic rhinitis - Testing today showed: negative to the entire panel - This is not surprising since the emergence of allergies at this age is extremely uncommon. - She also has no history of allergies, even when she was younger, therefore allergies are highly unlikely to suddenly emerge.  - Continue with: Astelin (azelastine) 2 sprays per nostril 1-2 times daily as needed - Start taking: Xhance (fluticasone) 1-2 sprays per nostril twice daily - Consider nasal saline rinses 1-2 times daily to remove allergens from the nasal cavities as well as help with mucous clearance (this is especially helpful to do before the nasal sprays are given) - Let us know how this new nose spray is working.   3. Sensation of fullness in both ears - We will refer you to see Dr. Will Bonnet (ENT) for a third opinion. - He might want to do a CT scan of your sinuses to make sure that the anatomy looks good.  4. Return in about 2 months (around 01/02/2018).  Subjective:   PAISLEY GRAJEDA is a 77 y.o. female presenting today for evaluation of  Chief Complaint  Patient presents with  . Ear Problem    crackling in right ear. runny nose. watery eyes.     SHANEISHA BURKEL has a history of the following: Patient Active Problem List   Diagnosis Date Noted  . Dyspnea 04/01/2014  . Palpitations 04/01/2014  . Essential hypertension 04/01/2014  . Urinary tract infection due to Proteus 05/22/2012  . UTI (lower urinary tract infection) 2/2 Proteus  05/20/2012  . Bacteremia 2/2 UTI 05/20/2012  . Right Hydronephrosis with ureteral calculus 05/20/2012  . Chronic diastolic heart failure, NYHA class 1 ([place]) 05/20/2012  . Demand ischemia ([place]) 05/20/2012  . Anemia 05/20/2012  . Acute respiratory failure with hypoxia 2/2 ALI 05/20/2012  . Septic shock(785.52) 05/19/2012  . Urosepsis 05/17/2012  . Hypokalemia 05/17/2012  . Acute on chronic renal failure ([place]) 05/17/2012  . Transaminitis 05/17/2012  . Leukocytosis 05/17/2012  . Hyponatremia 05/17/2012  . Microcytic anemia 05/17/2012    History obtained from: chart review and patient.  Izora Gala was referred by [name], MD.     Makaylin is a 77 y.o. female presenting for evaluation of bilateral ear tenderness. Since November, she has had problems with ear crackling, chronic congestion, stuffiness, and postnasal drip. She was initially treated as an ear infection without improvement. She has had no pain or vertigo.   She initially went to see Dr. Hassell Done, who noted a left OME with negative pressure. She was given a steroid injection to help with clearance. It was also encouraged to use the nasal steroid spray on a more routine basis. He saw her again one week later and her effusion seemed to be resolved. There was a mild retraction of the TM on the left. At that visit, a myringotomy performed in the office setting. Once the hole closed, the pressure resumed once again. Therefore a ventilation tube was placed.  She then went to see Dr. Polly Cobia (but was scheduled for Dr. Redmond Baseman, who was held up in clinic). She had a sinus CT at that time, which was clear. She was noted to have effusion in the right side. He recommended nasal saline rinses as well as the fluticasone nasal spray. 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 He saw her again in six weeks and she was endorsing some dizziness and lightheadedness. During that visit, she was  cerumen removed and the tube on the left was removed as well. However, Ms. Bearden did not get along with Dr. Polly Cobia and does not plan on seeing him again.   Since those visits, she has continued to have some dripping from her nose and eyes. She has been on fluticasone, but is not using this one regularly. She has also been on azelastine, which she is somewhat better about using. None of them have seemed to have provided much help for her. She does not systemic antihistamines since these cause her BP to increase. There is nothing new in her environmental history whatsoever. She never previously had allergies ever.   She does tell me that she has ben unable to catch her breath. This is not often but has happened for a "good while". She has never needed inhalers and has never been to the ED or UC for these symptoms. This is very transient, improving over the course of 1-2 minutes.   She has a history of interstitial cystitis which apparently flares with certain foods. She also has an interesting infectious history. She did have several episodes of pneumonia over the years. She also had an episode of sepsis where she actually passed out when she was at her house. She was passed out for two days before one of her family members broke into her house to find her on the ground. There was never an etiologic agent discovered. She has never had an immune evaluation, per the patient.   Otherwise, there is no history of other atopic diseases, including asthma, drug allergies, food allergies, environmental allergies, stinging insect allergies, or urticaria. There is no significant infectious history. Vaccinations are up to date.    Past Medical History: Patient Active Problem List   Diagnosis Date Noted  . Dyspnea 04/01/2014  . Palpitations 04/01/2014  . Essential hypertension 04/01/2014  . Urinary tract infection due to Proteus 05/22/2012  . UTI (lower urinary tract infection) 2/2 Proteus 05/20/2012  .  Bacteremia 2/2 UTI 05/20/2012  . Right Hydronephrosis with ureteral calculus 05/20/2012  . Chronic diastolic heart failure, NYHA class 1 ([place]) 05/20/2012  . Demand ischemia ([place]) 05/20/2012  . Anemia 05/20/2012  . Acute respiratory failure with hypoxia 2/2 ALI 05/20/2012  . Septic shock(785.52) 05/19/2012  . Urosepsis 05/17/2012  . Hypokalemia 05/17/2012  . Acute on chronic renal failure ([place]) 05/17/2012  . Transaminitis 05/17/2012  . Leukocytosis 05/17/2012  . Hyponatremia 05/17/2012  . Microcytic anemia 05/17/2012    Medication List:  Allergies as of 11/02/2017      Reactions   No Known Allergies       Medication List        Accurate as of 11/02/17 11:59 PM. Always use your most recent med list.          ADVIL PM 200-25 MG Caps Generic drug:  Ibuprofen-diphenhydrAMINE HCl Take 0.5-1 tablets by mouth daily as needed (sleep).   amLODipine 2.5 MG tablet Commonly known  as:  NORVASC Take 2.5 mg by mouth daily.   atorvastatin 20 MG tablet Commonly known as:  LIPITOR Take 20 mg by mouth once a week.   Azelastine HCl 0.15 % Soln 2 sprays in each nostril 1-2 times daily.   clonazePAM 1 MG tablet Commonly known as:  KLONOPIN Take 1 mg by mouth 2 (two) times daily as needed for anxiety.   fluticasone 50 MCG/ACT nasal spray Commonly known as:  FLONASE 2 sprays by Each Nare route daily.   Fluticasone Propionate 93 MCG/ACT Exhu Commonly known as:  XHANCE Place 2 puffs into the nose 2 (two) times daily.   hydrochlorothiazide 12.5 MG tablet Commonly known as:  HYDRODIURIL TK 1 T PO QD   metoprolol succinate 25 MG 24 hr tablet Commonly known as:  TOPROL XL Take 1 tablet (25 mg total) by mouth daily.   PAZEO 0.7 % Soln Generic drug:  Olopatadine HCl INT 1 GTT INTO OU D   pentosan polysulfate 100 MG capsule Commonly known as:  ELMIRON Take 100 mg by mouth 3 (three) times daily.   SYSTANE OP Apply 1 drop to eye 2 (two) times daily.       Birth History:  non-contributory.   Developmental History: non-contributory.   Past Surgical History: Past Surgical History:  Procedure Laterality Date  . BREAST DUCTAL SYSTEM EXCISION Right 10/30/2016   Procedure: RIGHT BREAST DUCT EXCISION;  Surgeon: Rolm Bookbinder, MD;  Location: [facility];  Service: General;  Laterality: Right;  . EYE SURGERY     bilateral lense placements  . MASTECTOMY Left   . NECK SURGERY     muscles cut in her neck  . NEPHROSTOMY    . TONSILLECTOMY       Family History: Family History  Problem Relation Age of Onset  . CAD Father   . Heart attack Father   . Dementia Mother      Social History: Deardra lives at home by herself. She lives in a townhome for the past 7 years. There is a lot of construction occurring around her home. There is wood and carpeting in the main living areas and carpeting in the bedrooms. She has gas heating and central cooling. There are no animals inside or outside of the home. There are no dust mite coverings on the bedding. There is no tobacco exposure in the home. 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 She was widowed as of 20 years ago.     Review of Systems: a 14-point review of systems is pertinent for what is mentioned in HPI.  Otherwise, all other systems were negative. Constitutional: negative other than that listed in the HPI Eyes: negative other than that listed in the HPI Ears, nose, mouth, throat, and face: negative other than that listed in the HPI Respiratory: negative other than that listed in the HPI Cardiovascular: negative other than that listed in the HPI Gastrointestinal: negative other than that listed in the HPI Genitourinary: negative other than that listed in the HPI Integument: negative other than that listed in the HPI Hematologic: negative other than that listed in the HPI Musculoskeletal: negative other than that listed in the HPI Neurological: negative other than that listed in the  HPI Allergy/Immunologic: negative other than that listed in the HPI    Objective:   Blood pressure 122/70, pulse 82, temperature 98.7 F (37.1 C), temperature source Oral, resp. rate 18, height 5\' 3"  (1.6 m), weight 149 lb (67.6 kg). Body mass  index is 26.39 kg/m.   Physical Exam:  General: Alert, interactive, in no acute distress. Pleasant and talkative.  Eyes: No conjunctival injection bilaterally, no discharge on the right, no discharge on the left and no Horner-Trantas dots present. PERRL bilaterally. EOMI without pain. No photophobia.  Ears: Right TM pearly gray with normal light reflex, Left OME, Right TM intact without perforation and Left TM intact without perforation.  Nose/Throat: External nose within normal limits, nasal crease present and septum midline. Turbinates mildly edematous with clear discharge. Posterior oropharynx mildly erythematous without cobblestoning in the posterior oropharynx. Tonsils 2+ without exudates.  Tongue without thrush. Neck: Supple without thyromegaly. Trachea midline. Adenopathy: no enlarged lymph nodes appreciated in the anterior cervical, occipital, axillary, epitrochlear, inguinal, or popliteal regions. Lungs: Clear to auscultation without wheezing, rhonchi or rales. No increased work of breathing. CV: Normal S1/S2. No murmurs. Capillary refill <2 seconds.  Abdomen: Nondistended, nontender. No guarding or rebound tenderness. Bowel sounds present in all fields and hypoactive  Skin: Warm and dry, without lesions or rashes. Extremities:  No clubbing, cyanosis or edema. Neuro:   Grossly intact. No focal deficits appreciated. Responsive to questions.  Diagnostic studies:    Allergy Studies:   Indoor/Outdoor Percutaneous Adult Environmental Panel: negative to the entire panel with adequate controls.  Indoor/Outdoor Selected Intradermal Environmental Panel: negative to the entire panel with adequate controls.   Allergy testing results were  read and interpreted by myself, documented by clinical staff.       Salvatore Marvel, MD [facility]

## 2017-11-03 ENCOUNTER — Encounter: Payer: Self-pay | Admitting: Allergy & Immunology

## 2017-11-03 DIAGNOSIS — B999 Unspecified infectious disease: Secondary | ICD-10-CM | POA: Insufficient documentation

## 2017-11-03 DIAGNOSIS — J310 Chronic rhinitis: Secondary | ICD-10-CM

## 2017-11-03 HISTORY — DX: Chronic rhinitis: J31.0

## 2017-11-07 ENCOUNTER — Telehealth: Payer: Self-pay | Admitting: Allergy & Immunology

## 2017-11-07 NOTE — Telephone Encounter (Signed)
Patient advised that Dr. Ernst Bowler has not reviewed lab results yet. I let her know that as soon as we got a result note form him someone would be in contact with her.

## 2017-11-07 NOTE — Telephone Encounter (Signed)
Patient called requesting blood test results. Saw Dr. Ernst Bowler in [place].

## 2017-11-08 ENCOUNTER — Other Ambulatory Visit: Payer: Self-pay

## 2017-11-08 ENCOUNTER — Emergency Department (HOSPITAL_BASED_OUTPATIENT_CLINIC_OR_DEPARTMENT_OTHER)
Admission: EM | Admit: 2017-11-08 | Discharge: 2017-11-08 | Disposition: A | Discharge status: Home / Self Care | Payer: Medicare Other | Attending: Emergency Medicine | Admitting: Emergency Medicine

## 2017-11-08 ENCOUNTER — Encounter (HOSPITAL_BASED_OUTPATIENT_CLINIC_OR_DEPARTMENT_OTHER): Payer: Self-pay | Admitting: Emergency Medicine

## 2017-11-08 DIAGNOSIS — R110 Nausea: Secondary | ICD-10-CM | POA: Insufficient documentation

## 2017-11-08 DIAGNOSIS — H9193 Unspecified hearing loss, bilateral: Secondary | ICD-10-CM

## 2017-11-08 DIAGNOSIS — I10 Essential (primary) hypertension: Secondary | ICD-10-CM | POA: Diagnosis not present

## 2017-11-08 DIAGNOSIS — Z853 Personal history of malignant neoplasm of breast: Secondary | ICD-10-CM | POA: Diagnosis not present

## 2017-11-08 DIAGNOSIS — Z79899 Other long term (current) drug therapy: Secondary | ICD-10-CM | POA: Diagnosis not present

## 2017-11-08 DIAGNOSIS — H9203 Otalgia, bilateral: Secondary | ICD-10-CM | POA: Diagnosis present

## 2017-11-08 DIAGNOSIS — E785 Hyperlipidemia, unspecified: Secondary | ICD-10-CM | POA: Insufficient documentation

## 2017-11-08 HISTORY — DX: Unspecified obstruction of eustachian tube, bilateral: H68.103

## 2017-11-08 MED ORDER — ONDANSETRON 8 MG PO TBDP: 8.0000 mg | ORAL_TABLET | Freq: Three times a day (TID) | ORAL | 0 refills | Status: DC | PRN

## 2017-11-08 MED ORDER — ONDANSETRON 8 MG PO TBDP
8.0000 mg | ORAL_TABLET | Freq: Once | ORAL | Status: AC
  Administered 2017-11-08: 8 mg via ORAL
  Filled 2017-11-08: qty 1

## 2017-11-08 NOTE — ED Notes (Signed)
Pt drove self to ED. 

## 2017-11-08 NOTE — ED Triage Notes (Addendum)
Pt states. "I'm just miserable." pt has been having ear trouble for a while now and has had several ear procedures done on left ear for eustachian tube tube dysfunction. Did not return to ENT for follow up for right ear. Pt was seen by PMD 5/24 and referred to another ENT. Pt states her BP was 158/88 at home which is high for her.

## 2017-11-08 NOTE — ED Provider Notes (Signed)
[facility] DEPT [facility] Provider Note: Rachael Spurling, MD, FACEP  CSN: [id number] MRN: [id number] ARRIVAL: 11/08/17 at [place]: [place]  Ear Pain   HISTORY OF PRESENT ILLNESS  11/08/17 2:46 AM Rachael Greer is a 77 y.o. female has a history of chronic eustachian tube dysfunction.  She is status post placement and subsequent removal of bilateral tympanostomy tubes.  She is experiencing an abnormal sensation in her ears, more prominent in the left.  She states it does not of pain but rather the sensation of sound echoing in her ears.  She got anxious this morning because of her symptoms and this made her nauseated but she did not vomit.  She also became concerned because her blood pressure was higher than usual, 295 systolic.  It was rechecked on arrival and is 141/67.  She had no drainage from her ears.   Past Medical History:  Diagnosis Date  . Anxiety   . Breast cancer ([place])   . Chest pain   . Depression   . DOE (dyspnea on exertion)   . Eustachian tube obstruction, bilateral   . Gastric reflux   . History of kidney stones   . Hyperlipidemia   . Hypertension   . IBS (irritable bowel syndrome)   . Interstitial cystitis   . Nephrolithiasis   . Nephrolithiasis   . Palpitations   . Pre-diabetes   . Sepsis [facility]     Past Surgical History:  Procedure Laterality Date  . BREAST DUCTAL SYSTEM EXCISION Right 10/30/2016   Procedure: RIGHT BREAST DUCT EXCISION;  Surgeon: Rolm Bookbinder, MD;  Location: [place];  Service: General;  Laterality: Right;  . EYE SURGERY     bilateral lense placements  . MASTECTOMY Left   . NECK SURGERY     muscles cut in her neck  . NEPHROSTOMY    . TONSILLECTOMY      Family History  Problem Relation Age of Onset  . CAD Father   . Heart attack Father   . Dementia Mother     Social History   Tobacco Use  . Smoking status: Never Smoker  . Smokeless tobacco: Never Used  Substance Use Topics  . Alcohol use: No  . Drug  use: No    Prior to Admission medications   Medication Sig Start Date End Date Taking? Authorizing Provider  amLODipine (NORVASC) 2.5 MG tablet Take 2.5 mg by mouth daily. 09/03/16   [provider]  atorvastatin (LIPITOR) 20 MG tablet Take 20 mg by mouth once a week.     [provider]  Azelastine HCl 0.15 % SOLN 2 sprays in each nostril 1-2 times daily. 11/02/17   Valentina Shaggy, MD  clonazePAM (KLONOPIN) 1 MG tablet Take 1 mg by mouth 2 (two) times daily as needed for anxiety.  03/31/16   [provider]  fluticasone (FLONASE) 50 MCG/ACT nasal spray 2 sprays by Each Nare route daily. 07/17/17   [provider]  Fluticasone Propionate (XHANCE) 93 MCG/ACT EXHU Place 2 puffs into the nose 2 (two) times daily. 11/02/17   Valentina Shaggy, MD  hydrochlorothiazide (HYDRODIURIL) 12.5 MG tablet TK 1 T PO QD 10/29/17   [provider]  Ibuprofen-Diphenhydramine HCl (ADVIL PM) 200-25 MG CAPS Take 0.5-1 tablets by mouth daily as needed (sleep).    [provider]  metoprolol succinate (TOPROL XL) 25 MG 24 hr tablet Take 1 tablet (25 mg total) by mouth daily. Patient taking differently: Take  25 mg by mouth every evening.  04/05/16   Lelon Perla, MD  PAZEO 0.7 % SOLN INT 1 GTT INTO OU D 10/30/17   [provider]  pentosan polysulfate (ELMIRON) 100 MG capsule Take 100 mg by mouth 3 (three) times daily.     [provider]  Polyethyl Glycol-Propyl Glycol (SYSTANE OP) Apply 1 drop to eye 2 (two) times daily.    [provider]    Allergies No known allergies   REVIEW OF SYSTEMS  Negative except as noted here or in the History of Present Illness.   PHYSICAL EXAMINATION  Initial Vital Signs Blood pressure (!) 141/67, pulse 68, temperature 98.4 F (36.9 C), temperature source Oral, resp. rate 18, height 5\' 3"  (1.6 m), weight 67.6 kg (149 lb), SpO2 100 %.  Examination General: Well-developed, well-nourished  female in no acute distress; appearance consistent with age of record HENT: normocephalic; atraumatic; no TM erythema or perforation Eyes: pupils equal, round and reactive to light; extraocular muscles intact Neck: supple Heart: regular rate and rhythm Lungs: clear to auscultation bilaterally Abdomen: soft; nondistended; nontender; bowel sounds present Extremities: No deformity; full range of motion; pulses normal Neurologic: Awake, alert and oriented; motor function intact in all extremities and symmetric; no facial droop Skin: Warm and dry Psychiatric: Normal mood and affect   RESULTS  Summary of this visit's results, reviewed by myself:   EKG Interpretation  Date/Time:    Ventricular Rate:    PR Interval:    QRS Duration:   QT Interval:    QTC Calculation:   R Axis:     Text Interpretation:        Laboratory Studies: No results found for this or any previous visit (from the past 24 hour(s)). Imaging Studies: No results found.  ED COURSE and MDM  Nursing notes and initial vitals signs, including pulse oximetry, reviewed.  Vitals:   11/08/17 0139 11/08/17 0141  BP:  (!) 141/67  Pulse:  68  Resp:  18  Temp:  98.4 F (36.9 C)  TempSrc:  Oral  SpO2:  100%  Weight: 67.6 kg (149 lb)   Height: 5\' 3"  (1.6 m)    Patient reassured about her blood pressure being elevated as it has improved.  We will treat her nausea.  She states she has an antianxiety drug at home that she can take for her anxiety symptoms.  PROCEDURES    ED DIAGNOSES     ICD-10-CM   1. 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

## 2017-11-09 ENCOUNTER — Telehealth: Payer: Self-pay | Admitting: *Deleted

## 2017-11-09 NOTE — Telephone Encounter (Signed)
We are still awaiting the environmental allergy panel.  With regards to her immune workup, complement levels were normal as are the immunoglobulin levels (proteins that bind to bacteria and viruses to neutralize them). We tested her response to vaccinations and she was not protective to Diphtheria and was only protective to 6/23 types of Streptococcus pneumonia. Therefore she needs a Tdap and Pneumovax with repeat titers in 4-6 weeks.   Salvatore Marvel, MD [facility]

## 2017-11-09 NOTE — Telephone Encounter (Signed)
Rachael Greer called to want to know her lab results that were drawn on 11/02/17. She stated that she had spoke to Dr. Ernst Bowler and he explained to her that not all of the results were in yet. I explained to her that due to the holiday is why there is a delay in some of her lab results coming in. As soon as all of her results are in and are reviewed we will get in touch with her as soon as possible. She wanted to know her results so that she could let her other Dr. Gwyndolyn Kaufman so that she can have some procedure on her ears?

## 2017-11-09 NOTE — Telephone Encounter (Signed)
I called back the patient at 4:13pm and left a voicemail asking her to call back so we can discuss some of her labs. Will follow up Monday and give her another call.

## 2017-11-12 ENCOUNTER — Telehealth: Payer: Self-pay | Admitting: Allergy & Immunology

## 2017-11-12 DIAGNOSIS — B999 Unspecified infectious disease: Secondary | ICD-10-CM

## 2017-11-12 NOTE — Telephone Encounter (Signed)
Spoke with the patient this afternoon and discussed lab work with them and explained the delay in the Environmental Allergy Panel. The patient was concerned about their ears and I suggested that they contact their PCP to have them looked at since they were bothering her. I explained the need for the patient to obtain the Tdap and Pneumovax vaccine and she proclaimed that she cannot have vaccines because she was septic once? I explained to her to discuss this with her PCP so that they can decide what would be the best course of action for her care. She will follow up with repeat titers in 4-6 weeks either with her PCP or with [name].

## 2017-11-12 NOTE — Telephone Encounter (Signed)
Pt called and wanted to know if labs results have come back yet because her ears are really hurting and needs something done. [phone].

## 2017-11-14 ENCOUNTER — Encounter: Payer: Self-pay | Admitting: *Deleted

## 2017-11-17 LAB — IGE+ALLERGENS ZONE 2(30)
Aspergillus Fumigatus IgE: <0.1 kU/L
Bahia Grass IgE: <0.1 kU/L
Cat Dander IgE: <0.1 kU/L
Cockroach, American IgE: <0.1 kU/L
Common Silver Birch IgE: <0.1 kU/L
Elm, American IgE: <0.1 kU/L
Hickory, White IgE: <0.1 kU/L
IGE (IMMUNOGLOBULIN E), SERUM: 6 [IU]/mL (ref 6–495)
Johnson Grass IgE: <0.1 kU/L
Maple/Box Elder IgE: <0.1 kU/L
Mugwort IgE Qn: <0.1 kU/L
Nettle IgE: <0.1 kU/L
Oak, White IgE: <0.1 kU/L
Penicillium Chrysogen IgE: <0.1 kU/L
Pigweed, Rough IgE: <0.1 kU/L
Plantain, English IgE: <0.1 kU/L
Sheep Sorrel IgE Qn: <0.1 kU/L
Stemphylium Herbarum IgE: <0.1 kU/L
White Mulberry IgE: <0.1 kU/L

## 2017-11-17 LAB — CBC WITH DIFFERENTIAL/PLATELET
BASOS ABS: 0.1 10*3/uL (ref 0.0–0.2)
Basos: 1 %
EOS (ABSOLUTE): 0.1 10*3/uL (ref 0.0–0.4)
Eos: 2 %
Hematocrit: 46.7 % — ABNORMAL HIGH (ref 34.0–46.6)
Hemoglobin: 16.3 g/dL — ABNORMAL HIGH (ref 11.1–15.9)
IMMATURE GRANULOCYTES: 0 %
Immature Grans (Abs): 0 10*3/uL (ref 0.0–0.1)
LYMPHS ABS: 2.1 10*3/uL (ref 0.7–3.1)
Lymphs: 30 %
MCH: 29.5 pg (ref 26.6–33.0)
MCHC: 34.9 g/dL (ref 31.5–35.7)
MCV: 85 fL (ref 79–97)
MONOCYTES: 9 %
MONOS ABS: 0.6 10*3/uL (ref 0.1–0.9)
NEUTROS PCT: 58 %
Neutrophils Absolute: 4.1 10*3/uL (ref 1.4–7.0)
Platelets: 192 10*3/uL (ref 150–450)
RBC: 5.52 x10E6/uL — AB (ref 3.77–5.28)
RDW: 13.9 % (ref 12.3–15.4)
WBC: 7 10*3/uL (ref 3.4–10.8)

## 2017-11-17 LAB — DIPHTHERIA / TETANUS ANTIBODY PANEL: TETANUS AB, IGG: 0.2 [IU]/mL (ref <0.10)

## 2017-11-17 LAB — STREP PNEUMONIAE 23 SEROTYPES IGG
PNEUMO AB TYPE 19 (19F): 1 ug/mL — AB (ref >1.3)
PNEUMO AB TYPE 1: 4.3 ug/mL (ref >1.3)
PNEUMO AB TYPE 22 (22F): 0.5 ug/mL — AB (ref >1.3)
PNEUMO AB TYPE 23 (23F): 0.7 ug/mL — AB (ref >1.3)
PNEUMO AB TYPE 2: 1.5 ug/mL (ref >1.3)
PNEUMO AB TYPE 56 (18C): 0.7 ug/mL — AB (ref >1.3)
PNEUMO AB TYPE 68 (9V): 0.8 ug/mL — AB (ref >1.3)
PNEUMO AB TYPE 70 (33F): 0.6 ug/mL — AB (ref >1.3)
Pneumo Ab Type 12 (12F)*: <0.1 ug/mL — ABNORMAL LOW (ref >1.3)
Pneumo Ab Type 14*: 2.5 ug/mL (ref >1.3)
Pneumo Ab Type 17 (17F)*: 1 ug/mL — ABNORMAL LOW (ref >1.3)
Pneumo Ab Type 20*: <0.1 ug/mL — ABNORMAL LOW (ref >1.3)
Pneumo Ab Type 26 (6B)*: 0.2 ug/mL — ABNORMAL LOW (ref >1.3)
Pneumo Ab Type 3*: 3.8 ug/mL (ref >1.3)
Pneumo Ab Type 34 (10A)*: <0.1 ug/mL — ABNORMAL LOW (ref >1.3)
Pneumo Ab Type 4*: 0.6 ug/mL — ABNORMAL LOW (ref >1.3)
Pneumo Ab Type 43 (11A)*: 1.1 ug/mL — ABNORMAL LOW (ref >1.3)
Pneumo Ab Type 5*: 3.1 ug/mL (ref >1.3)
Pneumo Ab Type 51 (7F)*: <0.1 ug/mL — ABNORMAL LOW (ref >1.3)
Pneumo Ab Type 54 (15B)*: 0.4 ug/mL — ABNORMAL LOW (ref >1.3)
Pneumo Ab Type 57 (19A)*: 0.5 ug/mL — ABNORMAL LOW (ref >1.3)
Pneumo Ab Type 8*: 0.2 ug/mL — ABNORMAL LOW (ref >1.3)
Pneumo Ab Type 9 (9N)*: 1.4 ug/mL (ref >1.3)

## 2017-11-17 LAB — COMPLEMENT, TOTAL

## 2017-11-17 LAB — IGG, IGA, IGM
IGG (IMMUNOGLOBIN G), SERUM: 671 mg/dL — AB (ref 700–1600)
IGM (IMMUNOGLOBULIN M), SRM: 477 mg/dL — AB (ref 26–217)
IgA/Immunoglobulin A, Serum: 97 mg/dL (ref 64–422)

## 2017-11-19 ENCOUNTER — Encounter: Payer: Self-pay | Admitting: *Deleted

## 2017-11-21 ENCOUNTER — Telehealth: Payer: Self-pay | Admitting: Allergy

## 2017-11-21 NOTE — Telephone Encounter (Signed)
Patient can't be reached to set up shipment of Xhance.

## 2017-11-21 NOTE — Telephone Encounter (Signed)
Patient called back and said the Rachael Greer was not helping. She is going to get her injections tomorrow and then she has appointment with the ear doctor. Said she would let us know.

## 2017-11-23 DIAGNOSIS — H9113 Presbycusis, bilateral: Secondary | ICD-10-CM | POA: Diagnosis not present

## 2017-11-23 DIAGNOSIS — R0600 Dyspnea, unspecified: Secondary | ICD-10-CM | POA: Diagnosis not present

## 2017-11-23 DIAGNOSIS — J9811 Atelectasis: Secondary | ICD-10-CM | POA: Diagnosis not present

## 2017-11-23 DIAGNOSIS — H6983 Other specified disorders of Eustachian tube, bilateral: Secondary | ICD-10-CM | POA: Diagnosis not present

## 2017-11-23 DIAGNOSIS — H9209 Otalgia, unspecified ear: Secondary | ICD-10-CM | POA: Diagnosis not present

## 2017-11-23 DIAGNOSIS — H6523 Chronic serous otitis media, bilateral: Secondary | ICD-10-CM | POA: Diagnosis not present

## 2017-11-23 DIAGNOSIS — H906 Mixed conductive and sensorineural hearing loss, bilateral: Secondary | ICD-10-CM | POA: Diagnosis not present

## 2017-11-23 DIAGNOSIS — Z23 Encounter for immunization: Secondary | ICD-10-CM | POA: Diagnosis not present

## 2017-11-23 NOTE — Telephone Encounter (Signed)
Noted.  Thank you for keeping me in the loop.  Salvatore Marvel, MD [facility]

## 2017-11-24 DIAGNOSIS — H6523 Chronic serous otitis media, bilateral: Secondary | ICD-10-CM | POA: Insufficient documentation

## 2017-11-24 DIAGNOSIS — H906 Mixed conductive and sensorineural hearing loss, bilateral: Secondary | ICD-10-CM | POA: Insufficient documentation

## 2017-11-24 HISTORY — DX: Chronic serous otitis media, bilateral: H65.23

## 2017-11-24 HISTORY — DX: Mixed conductive and sensorineural hearing loss, bilateral: H90.6

## 2017-12-03 DIAGNOSIS — H6523 Chronic serous otitis media, bilateral: Secondary | ICD-10-CM | POA: Diagnosis not present

## 2017-12-03 DIAGNOSIS — H6983 Other specified disorders of Eustachian tube, bilateral: Secondary | ICD-10-CM | POA: Diagnosis not present

## 2017-12-04 ENCOUNTER — Ambulatory Visit: Payer: Self-pay | Admitting: Allergy and Immunology

## 2017-12-11 ENCOUNTER — Other Ambulatory Visit: Payer: Self-pay | Admitting: Internal Medicine

## 2017-12-11 DIAGNOSIS — R0600 Dyspnea, unspecified: Secondary | ICD-10-CM

## 2017-12-12 ENCOUNTER — Ambulatory Visit (INDEPENDENT_AMBULATORY_CARE_PROVIDER_SITE_OTHER): Payer: Medicare Other | Admitting: Internal Medicine

## 2017-12-12 DIAGNOSIS — R0600 Dyspnea, unspecified: Secondary | ICD-10-CM

## 2017-12-12 LAB — PULMONARY FUNCTION TEST
DL/VA % pred: 80 %
DL/VA: 3.77 ml/min/mmHg/L
DLCO UNC: 16.26 ml/min/mmHg
DLCO cor % pred: 65 %
DLCO cor: 15.07 ml/min/mmHg
DLCO unc % pred: 70 %
FEF 25-75 Post: 2.11 L/sec
FEF 25-75 Pre: 1.5 L/sec
FEF2575-%Change-Post: 41 %
FEF2575-%Pred-Post: 139 %
FEF2575-%Pred-Pre: 99 %
FEV1-%CHANGE-POST: 8 %
FEV1-%PRED-POST: 103 %
FEV1-%PRED-PRE: 95 %
FEV1-POST: 2.01 L
FEV1-Pre: 1.86 L
FEV1FVC-%Change-Post: 7 %
FEV1FVC-%PRED-PRE: 104 %
FEV6-%Change-Post: 0 %
FEV6-%PRED-POST: 96 %
FEV6-%PRED-PRE: 96 %
FEV6-POST: 2.4 L
FEV6-Pre: 2.4 L
FEV6FVC-%CHANGE-POST: 0 %
FEV6FVC-%PRED-POST: 104 %
FEV6FVC-%Pred-Pre: 105 %
FVC-%CHANGE-POST: 1 %
FVC-%PRED-POST: 92 %
FVC-%PRED-PRE: 91 %
FVC-POST: 2.43 L
FVC-PRE: 2.4 L
PRE FEV1/FVC RATIO: 77 %
PRE FEV6/FVC RATIO: 100 %
Post FEV1/FVC ratio: 83 %
Post FEV6/FVC ratio: 99 %
RV % pred: 88 %
RV: 2.03 L
TLC % PRED: 90 %
TLC: 4.43 L

## 2017-12-12 NOTE — Progress Notes (Signed)
PFT done today. 

## 2017-12-17 ENCOUNTER — Telehealth: Payer: Self-pay | Admitting: Internal Medicine

## 2017-12-17 DIAGNOSIS — Z9622 Myringotomy tube(s) status: Secondary | ICD-10-CM | POA: Insufficient documentation

## 2017-12-17 NOTE — Telephone Encounter (Signed)
Dr. Julianne Rice fax #: [phone]. PFT has been faxed to PCP office.   Pt aware. Nothing further needed.

## 2017-12-31 DIAGNOSIS — R1310 Dysphagia, unspecified: Secondary | ICD-10-CM | POA: Diagnosis not present

## 2017-12-31 DIAGNOSIS — R079 Chest pain, unspecified: Secondary | ICD-10-CM | POA: Diagnosis not present

## 2017-12-31 DIAGNOSIS — R0609 Other forms of dyspnea: Secondary | ICD-10-CM | POA: Diagnosis not present

## 2017-12-31 DIAGNOSIS — H938X3 Other specified disorders of ear, bilateral: Secondary | ICD-10-CM | POA: Diagnosis not present

## 2018-01-03 DIAGNOSIS — N9489 Other specified conditions associated with female genital organs and menstrual cycle: Secondary | ICD-10-CM | POA: Diagnosis not present

## 2018-01-03 DIAGNOSIS — M797 Fibromyalgia: Secondary | ICD-10-CM | POA: Diagnosis not present

## 2018-01-03 DIAGNOSIS — N3010 Interstitial cystitis (chronic) without hematuria: Secondary | ICD-10-CM | POA: Diagnosis not present

## 2018-01-08 DIAGNOSIS — J3489 Other specified disorders of nose and nasal sinuses: Secondary | ICD-10-CM | POA: Diagnosis not present

## 2018-01-08 DIAGNOSIS — H6523 Chronic serous otitis media, bilateral: Secondary | ICD-10-CM | POA: Diagnosis not present

## 2018-01-08 DIAGNOSIS — Z9622 Myringotomy tube(s) status: Secondary | ICD-10-CM | POA: Diagnosis not present

## 2018-01-18 ENCOUNTER — Ambulatory Visit: Payer: Medicare Other | Admitting: Allergy & Immunology

## 2018-01-31 DIAGNOSIS — Z9622 Myringotomy tube(s) status: Secondary | ICD-10-CM | POA: Diagnosis not present

## 2018-01-31 DIAGNOSIS — H6523 Chronic serous otitis media, bilateral: Secondary | ICD-10-CM | POA: Diagnosis not present

## 2018-01-31 DIAGNOSIS — J3489 Other specified disorders of nose and nasal sinuses: Secondary | ICD-10-CM | POA: Diagnosis not present

## 2018-02-05 DIAGNOSIS — J329 Chronic sinusitis, unspecified: Secondary | ICD-10-CM | POA: Diagnosis not present

## 2018-02-05 DIAGNOSIS — J019 Acute sinusitis, unspecified: Secondary | ICD-10-CM | POA: Diagnosis not present

## 2018-02-07 DIAGNOSIS — J019 Acute sinusitis, unspecified: Secondary | ICD-10-CM | POA: Diagnosis not present

## 2018-02-12 ENCOUNTER — Telehealth: Payer: Self-pay | Admitting: Cardiology

## 2018-02-12 DIAGNOSIS — R1310 Dysphagia, unspecified: Secondary | ICD-10-CM | POA: Diagnosis not present

## 2018-02-12 NOTE — Progress Notes (Deleted)
Cardiology Office Note   Date:  02/12/2018   ID:  Rasheena, Talmadge 21-Aug-1940, MRN [id number]  PCP:  [name], MD  Cardiologist:  L[facility]  No chief complaint on file.    History of Present Illness: Rachael Greer is a 77 y.o. female who presents for ongoing assessment and management of palpitations, hx of diastolic CHF, Holter monitor 09/2013 revealed NSR with PAC's, PVC's and PAT. She was last seen by Dr. Stanford Breed on 04/05/2016 with complaints of dyspnea and sharp chest pain. It was Dr. Jacalyn Lefevre opinion that the chest discomfort was atypical , with no new changes in EKG. Echo was ordered to reassess LV function.     Past Medical History:  Diagnosis Date  . Anxiety   . Breast cancer ([place])   . Chest pain   . Depression   . DOE (dyspnea on exertion)   . Eustachian tube obstruction, bilateral   . Gastric reflux   . History of kidney stones   . Hyperlipidemia   . Hypertension   . IBS (irritable bowel syndrome)   . Interstitial cystitis   . Nephrolithiasis   . Nephrolithiasis   . Palpitations   . Pre-diabetes   . Sepsis [facility])     Past Surgical History:  Procedure Laterality Date  . BREAST DUCTAL SYSTEM EXCISION Right 10/30/2016   Procedure: RIGHT BREAST DUCT EXCISION;  Surgeon: Rolm Bookbinder, MD;  Location: [place];  Service: General;  Laterality: Right;  . EYE SURGERY     bilateral lense placements  . MASTECTOMY Left   . NECK SURGERY     muscles cut in her neck  . NEPHROSTOMY    . TONSILLECTOMY       Current Outpatient Medications  Medication Sig Dispense Refill  . amLODipine (NORVASC) 2.5 MG tablet Take 2.5 mg by mouth daily.    Marland Kitchen atorvastatin (LIPITOR) 20 MG tablet Take 20 mg by mouth once a week.     . Azelastine HCl 0.15 % SOLN 2 sprays in each nostril 1-2 times daily. 30 mL 5  . clonazePAM (KLONOPIN) 1 MG tablet Take 1 mg by mouth 2 (two) times daily as needed for anxiety.     . fluticasone (FLONASE) 50 MCG/ACT nasal spray 2 sprays by Each Nare route  daily.    . Fluticasone Propionate (XHANCE) 93 MCG/ACT EXHU Place 2 puffs into the nose 2 (two) times daily. 32 mL 12  . hydrochlorothiazide (HYDRODIURIL) 12.5 MG tablet TK 1 T PO QD  0  . Ibuprofen-Diphenhydramine HCl (ADVIL PM) 200-25 MG CAPS Take 0.5-1 tablets by mouth daily as needed (sleep).    . metoprolol succinate (TOPROL XL) 25 MG 24 hr tablet Take 1 tablet (25 mg total) by mouth daily. (Patient taking differently: Take 25 mg by mouth every evening. ) 90 tablet 3  . ondansetron (ZOFRAN ODT) 8 MG disintegrating tablet Take 1 tablet (8 mg total) by mouth every 8 (eight) hours as needed for nausea or vomiting. 10 tablet 0  . PAZEO 0.7 % SOLN INT 1 GTT INTO OU D  6  . pentosan polysulfate (ELMIRON) 100 MG capsule Take 100 mg by mouth 3 (three) times daily.     Rachael Greer Glycol-Propyl Glycol (SYSTANE OP) Apply 1 drop to eye 2 (two) times daily.     No current facility-administered medications for this visit.     Allergies:   No known allergies    Social History:  The patient  reports that she has never smoked. She  has never used smokeless tobacco. She reports that she does not drink alcohol or use drugs.   Family History:  The patient's family history includes CAD in her father; Dementia in her mother; Heart attack in her father.    ROS: All other systems are reviewed and negative. Unless otherwise mentioned in H&P    PHYSICAL EXAM: VS:  There were no vitals taken for this visit. , BMI There is no height or weight on file to calculate BMI. GEN: Well nourished, well developed, in no acute distress  HEENT: normal  Neck: no JVD, carotid bruits, or masses Cardiac: ***RRR; no murmurs, rubs, or gallops,no edema  Respiratory:  clear to auscultation bilaterally, normal work of breathing GI: soft, nontender, nondistended, + BS MS: no deformity or atrophy  Skin: warm and dry, no rash Neuro:  Strength and sensation are intact Psych: euthymic mood, full affect   EKG:  EKG {ACTION;  IS/IS [id number]} ordered today. The ekg ordered today demonstrates ***   Recent Labs: 11/02/2017: Hemoglobin 16.3; Platelets 192    Lipid Panel No results found for: CHOL, TRIG, HDL, CHOLHDL, VLDL, LDLCALC, LDLDIRECT    Wt Readings from Last 3 Encounters:  11/08/17 149 lb (67.6 kg)  11/02/17 149 lb (67.6 kg)  10/30/16 140 lb 14 oz (63.9 kg)      Other studies Reviewed: Echocardiogram June 15, 2012 Left ventricle: The cavity size was normal. Systolic function was normal. The estimated ejection fraction was in the range of 60% to 65%. Wall motion was normal; there were no regional wall motion abnormalities. Doppler parameters are consistent with abnormal left ventricular relaxation (grade 1 diastolic dysfunction). The E/e' ratio is <10, suggesting normal LV filling pressure. - Aortic valve: Sclerosis without stenosis. No regurgitation. - Mitral valve: Mildly sclerotic leaflets . Trivial regurgitation. - Left atrium: The atrium was normal in size. - Tricuspid valve: Poorly visualized. Trivial regurgitation. - Pulmonary arteries: PA peak pressure: 40mm Hg (S). - Systemic veins: The IVC measures <2.1 cm but does not collapse more than 50%, suggesting elevated RA pressure of 10 mmHg - ?positive pressure ventilation. ASSESSMENT AND PLAN:  1.  ***   Current medicines are reviewed at length with the patient today.    Labs/ tests ordered today include: *** Phill Myron. West Pugh, ANP, AACC   02/12/2018 10:30 AM    [place] [address], [place], [place] [address] Phone: [phone]; Fax: [phone]

## 2018-02-12 NOTE — Telephone Encounter (Signed)
New Message:      Pt states she is waking up drenched in sweat and when she goes outside she has a hard time breathing. I have scheduled the pt for 9/24 with [facility] but she would like sooner if she can

## 2018-02-12 NOTE — Telephone Encounter (Signed)
Spoke with pt, she reports waking up during the night with the sweats. She checks her bp and it is usually high. She also reports heart pounding when she wakes. She has multiple complaints and also has trouble breathing. Follow up scheduled for patient to be seen tomorrow.

## 2018-02-13 ENCOUNTER — Ambulatory Visit: Payer: Medicare Other | Admitting: Adult Health

## 2018-02-14 DIAGNOSIS — H938X9 Other specified disorders of ear, unspecified ear: Secondary | ICD-10-CM | POA: Diagnosis not present

## 2018-02-14 DIAGNOSIS — R5383 Other fatigue: Secondary | ICD-10-CM | POA: Diagnosis not present

## 2018-02-27 DIAGNOSIS — K210 Gastro-esophageal reflux disease with esophagitis: Secondary | ICD-10-CM | POA: Diagnosis not present

## 2018-02-27 DIAGNOSIS — R1310 Dysphagia, unspecified: Secondary | ICD-10-CM | POA: Diagnosis not present

## 2018-02-27 DIAGNOSIS — Z01818 Encounter for other preprocedural examination: Secondary | ICD-10-CM | POA: Diagnosis not present

## 2018-03-05 ENCOUNTER — Ambulatory Visit: Payer: Medicare Other | Admitting: Cardiology

## 2018-03-06 ENCOUNTER — Encounter: Payer: Self-pay | Admitting: Cardiology

## 2018-03-06 ENCOUNTER — Ambulatory Visit (INDEPENDENT_AMBULATORY_CARE_PROVIDER_SITE_OTHER): Payer: Medicare Other | Admitting: Cardiology

## 2018-03-06 VITALS — BP 128/78 | HR 73 | Ht 63.0 in | Wt 159.6 lb

## 2018-03-06 DIAGNOSIS — R079 Chest pain, unspecified: Secondary | ICD-10-CM | POA: Diagnosis not present

## 2018-03-06 DIAGNOSIS — I10 Essential (primary) hypertension: Secondary | ICD-10-CM | POA: Diagnosis not present

## 2018-03-06 DIAGNOSIS — R0602 Shortness of breath: Secondary | ICD-10-CM | POA: Diagnosis not present

## 2018-03-06 DIAGNOSIS — R42 Dizziness and giddiness: Secondary | ICD-10-CM | POA: Diagnosis not present

## 2018-03-06 NOTE — Assessment & Plan Note (Signed)
I suspect this is from hear ear problems, not cardiac

## 2018-03-06 NOTE — Assessment & Plan Note (Signed)
Controlled on her current medications 

## 2018-03-06 NOTE — Patient Instructions (Signed)
Medication Instructions:  No changes, continue current medications. If you need a refill on your cardiac medications before your next appointment, please call your pharmacy.  Labwork: None Ordered.   Testing/Procedures: None Ordered.  Special Instructions: None Ordered.  Follow-Up: Your physician wants you to follow-up in: 3 Months with Dr.Crenshaw.    Thank you for choosing [facility]!!

## 2018-03-06 NOTE — Progress Notes (Signed)
03/06/2018 Rachael Greer   07-08-1940  [id number]  Primary Physician [name], MD Primary Cardiologist: Dr Stanford Breed ([place] Oct 2017)  HPI:  Pleasant 77 y/o female seen today with multiple non cardiac complaints and primarily wanted a cardiology check up. She tells me she has been "miserable" since Dec 2018 for symptoms of stuffiness in her ears, occasional popping noise, dizziness, and being able to hear her heartbeat in her ears.  She had eustachian tubes placed in [place] in Dec. She tells me that the tubes may have been misplaced-out of alignment. She has been to see two different ENT specialist without satisfaction since then and plans on seeing a third. She was seen in an urgent care a few months ago when her B/P was elevated at home- 170/80-she was sent home after a few hours. Prior cardiac work up included an Echocardiogram December 2013 which showed normal LV function, grade 1 diastolic dysfunction and trace mitral regurgitation. A Holter monitor April 2015 showed sinus rhythm with PACs, PVCs and brief PAT. Stress echocardiogram November 2015 was normal. The pt denies any tachycardia, unusual dyspnea, or chest pain or tightness.     Current Outpatient Medications  Medication Sig Dispense Refill  . amLODipine (NORVASC) 2.5 MG tablet Take 2.5 mg by mouth daily.    Marland Kitchen atorvastatin (LIPITOR) 20 MG tablet Take 20 mg by mouth once a week.     . clonazePAM (KLONOPIN) 1 MG tablet Take 1 mg by mouth 2 (two) times daily as needed for anxiety.     . fluticasone (FLONASE) 50 MCG/ACT nasal spray 2 sprays by Each Nare route daily.    . Ibuprofen-Diphenhydramine HCl (ADVIL PM) 200-25 MG CAPS Take 0.5-1 tablets by mouth daily as needed (sleep).    Marland Kitchen ipratropium (ATROVENT) 0.03 % nasal spray Place 2 sprays into both nostrils every 12 (twelve) hours.    . metoprolol succinate (TOPROL XL) 25 MG 24 hr tablet Take 1 tablet (25 mg total) by mouth daily. (Patient taking differently: Take 25 mg by mouth every  evening. ) 90 tablet 3  . omeprazole (PRILOSEC) 40 MG capsule Take 40 mg by mouth daily.    Marland Kitchen PAZEO 0.7 % SOLN INT 1 GTT INTO OU D  6  . pentosan polysulfate (ELMIRON) 100 MG capsule Take 100 mg by mouth 3 (three) times daily.     Vladimir Faster Glycol-Propyl Glycol (SYSTANE OP) Apply 1 drop to eye 2 (two) times daily.     No current facility-administered medications for this visit.     Allergies  Allergen Reactions  . No Known Allergies     Past Medical History:  Diagnosis Date  . Anxiety   . Breast cancer ([place])   . Chest pain   . Depression   . DOE (dyspnea on exertion)   . Eustachian tube obstruction, bilateral   . Gastric reflux   . History of kidney stones   . Hyperlipidemia   . Hypertension   . IBS (irritable bowel syndrome)   . Interstitial cystitis   . Nephrolithiasis   . Nephrolithiasis   . Palpitations   . Pre-diabetes   . Sepsis [facility])     Social History   Socioeconomic History  . Marital status: Widowed    Spouse name: Not on file  . Number of children: 4  . Years of education: Not on file  . Highest education level: Not on file  Occupational History  . Not on file  Social Needs  .  Financial resource strain: Not on file  . Food insecurity:    Worry: Not on file    Inability: Not on file  . Transportation needs:    Medical: Not on file    Non-medical: Not on file  Tobacco Use  . Smoking status: Never Smoker  . Smokeless tobacco: Never Used  Substance and Sexual Activity  . Alcohol use: No  . Drug use: No  . Sexual activity: Not on file  Lifestyle  . Physical activity:    Days per week: Not on file    Minutes per session: Not on file  . Stress: Not on file  Relationships  . Social connections:    Talks on phone: Not on file    Gets together: Not on file    Attends religious service: Not on file    Active member of club or organization: Not on file    Attends meetings of clubs or organizations: Not on file    Relationship status: Not on  file  . Intimate partner violence:    Fear of current or ex partner: Not on file    Emotionally abused: Not on file    Physically abused: Not on file    Forced sexual activity: Not on file  Other Topics Concern  . Not on file  Social History Narrative  . Not on file     Family History  Problem Relation Age of Onset  . CAD Father   . Heart attack Father   . Dementia Mother      Review of Systems: General: negative for chills, fever, night sweats or weight changes.  Cardiovascular: negative for chest pain, dyspnea on exertion, edema, orthopnea, palpitations, paroxysmal nocturnal dyspnea or shortness of breath Dermatological: negative for rash Respiratory: negative for cough or wheezing Urologic: negative for hematuria Abdominal: negative for nausea, vomiting, diarrhea, bright red blood per rectum, melena, or hematemesis Neurologic: negative for visual changes, syncope All other systems reviewed and are otherwise negative except as noted above.    Blood pressure 128/78, pulse 73, height 5\' 3"  (1.6 m), weight 159 lb 9.6 oz (72.4 kg).  General appearance: alert, cooperative and no distress Neck: no carotid bruit and no JVD Lungs: clear to auscultation bilaterally Heart: regular rate and rhythm Abdomen: not distended Extremities: no edema Pulses: 2+ and symmetric Skin: pale cool dry Neurologic: Grossly normal  EKG NSR-HR 73  ASSESSMENT AND PLAN:   Dizziness I suspect this is from hear ear problems, not cardiac  Essential hypertension Controlled on her current medications   PLAN  At this time I had no new suggestions for the patient from a cardiac standpoint. I reassured that her EKG and exam, including her B/P, were normal. I suspect she can hear her heart beat secondary to her ear issues and though bothersome its not dangerous. I offered to arrange a GXT but she declined. I see no indication for an echo or Holter. She denies any tachycardia chest pain or dyspnea. She  indicated she was reassured after the visit. I will arrange for a 3 month follow up but she can cancel if she has no new complaints.   Kerin Ransom PA-C 03/06/2018 10:26 AM

## 2018-03-08 ENCOUNTER — Emergency Department (HOSPITAL_BASED_OUTPATIENT_CLINIC_OR_DEPARTMENT_OTHER)
Admission: EM | Admit: 2018-03-08 | Discharge: 2018-03-08 | Disposition: A | Discharge status: Home / Self Care | Payer: Medicare Other | Attending: Emergency Medicine | Admitting: Emergency Medicine

## 2018-03-08 ENCOUNTER — Other Ambulatory Visit: Payer: Self-pay

## 2018-03-08 ENCOUNTER — Emergency Department (HOSPITAL_BASED_OUTPATIENT_CLINIC_OR_DEPARTMENT_OTHER): Payer: Medicare Other

## 2018-03-08 ENCOUNTER — Encounter (HOSPITAL_BASED_OUTPATIENT_CLINIC_OR_DEPARTMENT_OTHER): Payer: Self-pay | Admitting: Emergency Medicine

## 2018-03-08 DIAGNOSIS — Z853 Personal history of malignant neoplasm of breast: Secondary | ICD-10-CM | POA: Insufficient documentation

## 2018-03-08 DIAGNOSIS — I110 Hypertensive heart disease with heart failure: Secondary | ICD-10-CM | POA: Insufficient documentation

## 2018-03-08 DIAGNOSIS — M542 Cervicalgia: Secondary | ICD-10-CM | POA: Diagnosis not present

## 2018-03-08 DIAGNOSIS — I5032 Chronic diastolic (congestive) heart failure: Secondary | ICD-10-CM | POA: Insufficient documentation

## 2018-03-08 DIAGNOSIS — R0981 Nasal congestion: Secondary | ICD-10-CM | POA: Insufficient documentation

## 2018-03-08 DIAGNOSIS — L040 Acute lymphadenitis of face, head and neck: Secondary | ICD-10-CM | POA: Diagnosis not present

## 2018-03-08 DIAGNOSIS — J392 Other diseases of pharynx: Secondary | ICD-10-CM | POA: Diagnosis not present

## 2018-03-08 DIAGNOSIS — Z79899 Other long term (current) drug therapy: Secondary | ICD-10-CM | POA: Diagnosis not present

## 2018-03-08 DIAGNOSIS — R05 Cough: Secondary | ICD-10-CM | POA: Insufficient documentation

## 2018-03-08 LAB — CBC WITH DIFFERENTIAL/PLATELET
BASOS ABS: 0.1 10*3/uL (ref 0.0–0.1)
Basophils Relative: 1 %
Eosinophils Absolute: 0.2 10*3/uL (ref 0.0–0.7)
Eosinophils Relative: 2 %
HEMATOCRIT: 43.8 % (ref 36.0–46.0)
Hemoglobin: 15.3 g/dL — ABNORMAL HIGH (ref 12.0–15.0)
Lymphocytes Relative: 25 %
Lymphs Abs: 2.1 10*3/uL (ref 0.7–4.0)
MCH: 29.3 pg (ref 26.0–34.0)
MCHC: 34.9 g/dL (ref 30.0–36.0)
MCV: 83.9 fL (ref 78.0–100.0)
Monocytes Absolute: 0.7 10*3/uL (ref 0.1–1.0)
Monocytes Relative: 9 %
NEUTROS ABS: 5.2 10*3/uL (ref 1.7–7.7)
Neutrophils Relative %: 63 %
Platelets: 169 10*3/uL (ref 150–400)
RBC: 5.22 MIL/uL — AB (ref 3.87–5.11)
RDW: 13.4 % (ref 11.5–15.5)
WBC: 8.1 10*3/uL (ref 4.0–10.5)

## 2018-03-08 LAB — BASIC METABOLIC PANEL
ANION GAP: 10 (ref 5–15)
BUN: 16 mg/dL (ref 8–23)
CHLORIDE: 106 mmol/L (ref 98–111)
CO2: 24 mmol/L (ref 22–32)
Calcium: 10.2 mg/dL (ref 8.9–10.3)
Creatinine, Ser: 0.69 mg/dL (ref 0.44–1.00)
GFR calc Af Amer: >60 mL/min (ref >60)
GLUCOSE: 90 mg/dL (ref 70–99)
POTASSIUM: 3.8 mmol/L (ref 3.5–5.1)
SODIUM: 140 mmol/L (ref 135–145)

## 2018-03-08 MED ORDER — CLINDAMYCIN HCL 150 MG PO CAPS
300.0000 mg | ORAL_CAPSULE | Freq: Once | ORAL | Status: AC
  Administered 2018-03-08: 300 mg via ORAL
  Filled 2018-03-08: qty 2

## 2018-03-08 MED ORDER — CLINDAMYCIN HCL 150 MG PO CAPS: 300.0000 mg | ORAL_CAPSULE | Freq: Three times a day (TID) | ORAL | 0 refills | Status: AC

## 2018-03-08 MED ORDER — IOPAMIDOL (ISOVUE-300) INJECTION 61%
100.0000 mL | Freq: Once | INTRAVENOUS | Status: AC | PRN
  Administered 2018-03-08: 75 mL via INTRAVENOUS

## 2018-03-08 NOTE — ED Notes (Signed)
Pt reports she is not having pain, just feels extremely congested and miserable.

## 2018-03-08 NOTE — ED Notes (Signed)
Pt noted to have tenderness on palpation of rt neck area.

## 2018-03-08 NOTE — ED Provider Notes (Signed)
[place] EMERGENCY DEPARTMENT Provider Note   CSN: [id number] Arrival date & time: 03/08/18  1733     History   Chief Complaint Chief Complaint  Patient presents with  . Neck Pain    HPI Rachael Greer is a 77 y.o. female.  77 year old female with past medical history including breast cancer, retention, IBS, diastolic CHF, chronic eustachian tube dysfunction who presents with right neck swelling.  2 days ago, she began noticing swelling on her right neck.  It has been painless.  She became concerned that it was related to her ear problems which is why she came in.  She denies any new swallowing problems, had chronic problems with swallowing requiring esophageal dilation 3 weeks ago.  No breathing problems.  No fevers.  She coughed a few times yesterday but no new upper respiratory infection symptoms.  No trauma.  No therapies tried prior to arrival.  The history is provided by the patient.  Neck Pain      Past Medical History:  Diagnosis Date  . Anxiety   . Breast cancer ([place])   . Chest pain   . Depression   . DOE (dyspnea on exertion)   . Eustachian tube obstruction, bilateral   . Gastric reflux   . History of kidney stones   . Hyperlipidemia   . Hypertension   . IBS (irritable bowel syndrome)   . Interstitial cystitis   . Nephrolithiasis   . Nephrolithiasis   . Palpitations   . Pre-diabetes   . Sepsis [facility])     Patient Active Problem List   Diagnosis Date Noted  . Dizziness 03/06/2018  . Non-allergic rhinitis 11/03/2017  . Recurrent infections 11/03/2017  . Palpitations 04/01/2014  . Essential hypertension 04/01/2014  . Right Hydronephrosis with ureteral calculus 05/20/2012  . Chronic diastolic heart failure, NYHA class 1 ([place]) 05/20/2012  . Demand ischemia ([place]) 05/20/2012  . Anemia 05/20/2012  . Acute respiratory failure with hypoxia 2/2 ALI 05/20/2012  . Hypokalemia 05/17/2012  . Acute on chronic renal failure ([place]) 05/17/2012  .  Transaminitis 05/17/2012  . Leukocytosis 05/17/2012  . Hyponatremia 05/17/2012  . Microcytic anemia 05/17/2012    Past Surgical History:  Procedure Laterality Date  . BREAST DUCTAL SYSTEM EXCISION Right 10/30/2016   Procedure: RIGHT BREAST DUCT EXCISION;  Surgeon: Rolm Bookbinder, MD;  Location: [place];  Service: General;  Laterality: Right;  . EYE SURGERY     bilateral lense placements  . MASTECTOMY Left   . NECK SURGERY     muscles cut in her neck  . NEPHROSTOMY    . TONSILLECTOMY       OB History   None      Home Medications    Prior to Admission medications   Medication Sig Start Date End Date Taking? Authorizing Provider  amLODipine (NORVASC) 2.5 MG tablet Take 2.5 mg by mouth daily. 09/03/16   [provider]  atorvastatin (LIPITOR) 20 MG tablet Take 20 mg by mouth once a week.     [provider]  clindamycin (CLEOCIN) 150 MG capsule Take 2 capsules (300 mg total) by mouth 3 (three) times daily for 7 days. 03/08/18 03/15/18  Benjamim Harnish, Wenda Overland, MD  clonazePAM (KLONOPIN) 1 MG tablet Take 1 mg by mouth 2 (two) times daily as needed for anxiety.  03/31/16   [provider]  fluticasone (FLONASE) 50 MCG/ACT nasal spray 2 sprays by Each Nare route daily. 07/17/17   [provider]  Ibuprofen-Diphenhydramine HCl (ADVIL  PM) 200-25 MG CAPS Take 0.5-1 tablets by mouth daily as needed (sleep).    [provider]  ipratropium (ATROVENT) 0.03 % nasal spray Place 2 sprays into both nostrils every 12 (twelve) hours.    [provider]  metoprolol succinate (TOPROL XL) 25 MG 24 hr tablet Take 1 tablet (25 mg total) by mouth daily. Patient taking differently: Take 25 mg by mouth every evening.  04/05/16   Lelon Perla, MD  omeprazole (PRILOSEC) 40 MG capsule Take 40 mg by mouth daily.    [provider]  PAZEO 0.7 % SOLN INT 1 GTT INTO OU D 10/30/17   [provider]  pentosan polysulfate (ELMIRON) 100 MG  capsule Take 100 mg by mouth 3 (three) times daily.     [provider]  Polyethyl Glycol-Propyl Glycol (SYSTANE OP) Apply 1 drop to eye 2 (two) times daily.    [provider]    Family History Family History  Problem Relation Age of Onset  . CAD Father   . Heart attack Father   . Dementia Mother     Social History Social History   Tobacco Use  . Smoking status: Never Smoker  . Smokeless tobacco: Never Used  Substance Use Topics  . Alcohol use: No  . Drug use: No     Allergies   No known allergies   Review of Systems Review of Systems  Musculoskeletal: Positive for neck pain.   All other systems reviewed and are negative except that which was mentioned in HPI   Physical Exam Updated Vital Signs BP 113/72   Pulse 77   Temp (!) 97.5 F (36.4 C) (Oral)   Resp 16   Ht 5\' 3"  (1.6 m)   Wt 72.4 kg   SpO2 98%   BMI 28.27 kg/m   Physical Exam  Constitutional: She is oriented to person, place, and time. She appears well-developed and well-nourished. No distress.  HENT:  Head: Normocephalic and atraumatic.  Right Ear: Ear canal normal.  Left Ear: Ear canal normal.  Mouth/Throat: Oropharynx is clear and moist. No oropharyngeal exudate.  Moist mucous membranes Bilateral tympanostomy tubes in place Symmetric fullness of soft palate, no uvula swelling or deviation  Eyes: Pupils are equal, round, and reactive to light. Conjunctivae are normal.  Neck: Neck supple. No tracheal deviation present. No thyromegaly present.  Fullness R lateral neck below angle of mandible, mild tenderness, no erythema, warmth, or fluctuance  Cardiovascular: Normal rate, regular rhythm and normal heart sounds.  No murmur heard. Pulmonary/Chest: Effort normal and breath sounds normal. No stridor.  Abdominal: Soft. Bowel sounds are normal. She exhibits no distension. There is no tenderness.  Musculoskeletal: She exhibits no edema.  Neurological: She is alert and oriented to  person, place, and time.  Fluent speech  Skin: Skin is warm and dry.  Psychiatric: She has a normal mood and affect. Judgment normal.  Nursing note and vitals reviewed.    ED Treatments / Results  Labs (all labs ordered are listed, but only abnormal results are displayed) Labs Reviewed  CBC WITH DIFFERENTIAL/PLATELET - Abnormal; Notable for the following components:      Result Value   RBC 5.22 (*)    Hemoglobin 15.3 (*)    All other components within normal limits  BASIC METABOLIC PANEL    EKG None  Radiology Ct Soft Tissue Neck W Contrast  Result Date: 03/08/2018 CLINICAL DATA:  RIGHT-sided neck pain for a day. Congestion and ear pain for  10 months. History of breast cancer, neck surgery, tonsillectomy. EXAM: CT NECK WITH CONTRAST TECHNIQUE: Multidetector CT imaging of the neck was performed using the standard protocol following the bolus administration of intravenous contrast. CONTRAST:  59mL ISOVUE-300 IOPAMIDOL (ISOVUE-300) INJECTION 61% COMPARISON:  S[facility] CT February 05, 2018 FINDINGS: PHARYNX AND LARYNX: Asymmetric fullness nasopharyngeal soft tissues effacing the nasal choana. Otherwise normal pharynx and larynx. SALIVARY GLANDS: Normal. THYROID: Normal. LYMPH NODES: 15 x 18 mm RIGHT lateral pharyngeal lymph node. Bilateral level 2 B lymphadenopathy measuring to 17 mm. RIGHT level 3 lymphadenopathy measuring to 17 mm. Bilateral supraclavicular lymphadenopathy measuring to 13 mm. VASCULAR: Mild calcific atherosclerosis carotid siphon. LIMITED INTRACRANIAL: Normal. VISUALIZED ORBITS: Status post bilateral ocular lens implants. MASTOIDS AND VISUALIZED PARANASAL SINUSES: Well-aerated. SKELETON: Nonacute.  No destructive bony lesions. UPPER CHEST: Lung apices are clear. No superior mediastinal lymphadenopathy. OTHER: None. IMPRESSION: 1. 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 2. RIGHT lymphadenitis seen with superimposed infection or pericapsular spread of tumor. 3. Acute findings discussed with and reconfirmed by Dr.Ocie Stanzione on 03/08/2018 at 10:22 pm. Electronically Signed   By: Elon Alas M.D.   On: 03/08/2018 22:24    Procedures Procedures (including critical care time)  Medications Ordered in ED Medications  iopamidol (ISOVUE-300) 61 % injection 100 mL (75 mLs Intravenous Contrast Given 03/08/18 2128)  clindamycin (CLEOCIN) capsule 300 mg (300 mg Oral Given 03/08/18 2313)     Initial Impression / Assessment and Plan / ED Course  I have reviewed the triage vital signs and the nursing notes.  Pertinent labs & imaging results that were available during my care of the patient were reviewed by me and considered in my medical decision making (see chart for details).     She was comfortable on exam, tolerating secretions, breathing normally, reassuring vital signs.  She had fullness of her right neck with no external signs of infection.  Lab work unremarkable.  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  Right neck swelling appears to be right lymphadenitis which may be due to spread of tumor or superimposed infection.  I discussed these findings with the radiologist and then with the patient.  Will start a trial of clindamycin to cover him for infectious lymphadenitis of neck.  I spent a long time explaining the need for follow-up with both ENT and her PCP to complete her work-up for this finding.  She understands the possibility of cancer.  I have extensively reviewed return precautions with the patient and her friend.  They have voiced understanding.  Final Clinical Impressions(s) / ED Diagnoses   Final diagnoses:  Acute lymphadenitis of neck  Nasopharyngeal mass    ED Discharge Orders         Ordered    clindamycin  (CLEOCIN) 150 MG capsule  3 times daily     03/08/18 2310           Veola Cafaro, Wenda Overland, MD 03/08/18 [phone]

## 2018-03-08 NOTE — ED Notes (Signed)
Pt presents today with Rt neck swelling. Onset noted approx 2 days ago. Pt also states she had esophageal dilation approx 3 weeks ago. A Dr. Keene Breath at [facility].

## 2018-03-08 NOTE — ED Triage Notes (Signed)
Right sided neck swelling for about three days.  Pt has been treated for bilateral ear pain, with difficulty swallowing.  Pt states she has issues with her ability to speak sentences without taking a breath mid-sentence.  These symptoms except neck swelling have been going on since November and pt is frustrated that no one is able to figure out what is wrong.

## 2018-03-08 NOTE — ED Notes (Signed)
Pt states she is uncomfortable and miserable with this neck swelling.

## 2018-03-08 NOTE — ED Notes (Signed)
Pt denies any hearing difficulty, Tracheal sounds are clear, appears to have no difficulty with speech or swallowing.

## 2018-03-08 NOTE — Discharge Instructions (Addendum)
Call Dr. [name] for an appointment. Call Dr. Redmond Baseman for an appointment. Take clindamycin as prescribed. Take probiotics (Culturelle) to help with diarrhea. Return to ER if you have any sudden new swallowing problems or any breathing problems.

## 2018-03-08 NOTE — ED Notes (Signed)
Patient transported to CT 

## 2018-03-11 DIAGNOSIS — H6523 Chronic serous otitis media, bilateral: Secondary | ICD-10-CM | POA: Diagnosis not present

## 2018-03-11 DIAGNOSIS — R930 Abnormal findings on diagnostic imaging of skull and head, not elsewhere classified: Secondary | ICD-10-CM | POA: Diagnosis not present

## 2018-03-11 DIAGNOSIS — J392 Other diseases of pharynx: Secondary | ICD-10-CM | POA: Diagnosis not present

## 2018-03-11 DIAGNOSIS — R896 Abnormal cytological findings in specimens from other organs, systems and tissues: Secondary | ICD-10-CM | POA: Diagnosis not present

## 2018-03-11 DIAGNOSIS — J343 Hypertrophy of nasal turbinates: Secondary | ICD-10-CM | POA: Diagnosis not present

## 2018-03-11 DIAGNOSIS — Z9622 Myringotomy tube(s) status: Secondary | ICD-10-CM | POA: Diagnosis not present

## 2018-03-11 DIAGNOSIS — Z9089 Acquired absence of other organs: Secondary | ICD-10-CM | POA: Diagnosis not present

## 2018-03-11 DIAGNOSIS — R590 Localized enlarged lymph nodes: Secondary | ICD-10-CM | POA: Diagnosis not present

## 2018-03-20 ENCOUNTER — Ambulatory Visit: Payer: Medicare Other | Admitting: Cardiology

## 2018-03-22 ENCOUNTER — Other Ambulatory Visit: Payer: Self-pay | Admitting: Otolaryngology

## 2018-03-22 ENCOUNTER — Other Ambulatory Visit (HOSPITAL_COMMUNITY)
Admission: RE | Admit: 2018-03-22 | Discharge: 2018-03-22 | Disposition: A | Discharge status: Home / Self Care | Payer: Medicare Other | Source: Ambulatory Visit | Attending: Otolaryngology | Admitting: Otolaryngology

## 2018-03-22 DIAGNOSIS — J392 Other diseases of pharynx: Secondary | ICD-10-CM | POA: Diagnosis not present

## 2018-03-22 DIAGNOSIS — R590 Localized enlarged lymph nodes: Secondary | ICD-10-CM | POA: Diagnosis not present

## 2018-03-22 DIAGNOSIS — C8299 Follicular lymphoma, unspecified, extranodal and solid organ sites: Secondary | ICD-10-CM | POA: Diagnosis not present

## 2018-03-22 DIAGNOSIS — C8291 Follicular lymphoma, unspecified, lymph nodes of head, face, and neck: Secondary | ICD-10-CM | POA: Diagnosis not present

## 2018-03-22 DIAGNOSIS — D3705 Neoplasm of uncertain behavior of pharynx: Secondary | ICD-10-CM | POA: Diagnosis not present

## 2018-03-30 DIAGNOSIS — C8511 Unspecified B-cell lymphoma, lymph nodes of head, face, and neck: Secondary | ICD-10-CM

## 2018-03-30 HISTORY — DX: Unspecified B-cell lymphoma, lymph nodes of head, face, and neck: C85.11

## 2018-04-01 ENCOUNTER — Telehealth: Payer: Self-pay | Admitting: *Deleted

## 2018-04-01 ENCOUNTER — Telehealth: Payer: Self-pay | Admitting: Hematology

## 2018-04-01 ENCOUNTER — Other Ambulatory Visit: Payer: Self-pay | Admitting: *Deleted

## 2018-04-01 DIAGNOSIS — C8231 Follicular lymphoma grade IIIa, lymph nodes of head, face, and neck: Secondary | ICD-10-CM

## 2018-04-01 NOTE — Telephone Encounter (Signed)
A new patient appt has been scheduled for the pt to see Dr. Irene Limbo on 10/24 at [place] will notify the pt.

## 2018-04-01 NOTE — Telephone Encounter (Signed)
Oncology Nurse Navigator Documentation  Placed introductory call to new referral patient Rachael Greer.  Introduced myself as the H&N oncology nurse navigator that works with Medical Oncology to whom she has been referred to by Dr. Redmond Baseman.  She confirmed understanding of referral.   Briefly explained my role as her navigator, indicated I would be joining her during appt to be scheduled.  Confirmed understanding of [place] location.  I explained I will be calling her with appt date/time.  Provided my contact information, encouraged her to call with questions/concerns prior to appt.  She voiced understanding of information provided, expressed appreciation for my call.  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 . Support system: Lives alone, has several close friends. . Transportation:  Drives self frequently, has friends who provide transportation PRN. . PCP:  Rachael Greer, unaware of dx.  He has been out of office extensively d/t health issues. . PCD:  Has PCD, Rachael Greer, sees q 61m.  Own teeth with "lots" of restorations, has single wisdom tooth remaining.  Rachael Orem, RN, BSN Head & Neck Oncology Nurse [place] at [place] [phone]

## 2018-04-02 ENCOUNTER — Telehealth: Payer: Self-pay | Admitting: *Deleted

## 2018-04-02 NOTE — Telephone Encounter (Signed)
Oncology Nurse Navigator Documentation  Spoke with Rachael Greer, informed her of 10/24 2:00 appt with Dr. Irene Limbo, explained arrival/registration procedures.  She voiced understanding.  [name], RN, BSN Head & Neck Oncology Nurse [place] at [place] [phone]

## 2018-04-04 ENCOUNTER — Encounter: Payer: Self-pay | Admitting: *Deleted

## 2018-04-04 ENCOUNTER — Telehealth: Payer: Self-pay

## 2018-04-04 ENCOUNTER — Inpatient Hospital Stay: Payer: Medicare Other | Attending: Hematology | Admitting: Hematology

## 2018-04-04 ENCOUNTER — Inpatient Hospital Stay: Payer: Medicare Other

## 2018-04-04 ENCOUNTER — Encounter: Payer: Self-pay | Admitting: Hematology

## 2018-04-04 VITALS — BP 127/78 | HR 80 | Temp 97.8°F | Resp 18 | Ht 63.0 in | Wt 160.4 lb

## 2018-04-04 DIAGNOSIS — C8231 Follicular lymphoma grade IIIa, lymph nodes of head, face, and neck: Secondary | ICD-10-CM

## 2018-04-04 DIAGNOSIS — I10 Essential (primary) hypertension: Secondary | ICD-10-CM

## 2018-04-04 DIAGNOSIS — M797 Fibromyalgia: Secondary | ICD-10-CM | POA: Diagnosis not present

## 2018-04-04 DIAGNOSIS — Z9223 Personal history of estrogen therapy: Secondary | ICD-10-CM | POA: Diagnosis not present

## 2018-04-04 DIAGNOSIS — Z853 Personal history of malignant neoplasm of breast: Secondary | ICD-10-CM | POA: Insufficient documentation

## 2018-04-04 DIAGNOSIS — R0602 Shortness of breath: Secondary | ICD-10-CM | POA: Diagnosis not present

## 2018-04-04 DIAGNOSIS — Z9010 Acquired absence of unspecified breast and nipple: Secondary | ICD-10-CM | POA: Insufficient documentation

## 2018-04-04 DIAGNOSIS — Z79899 Other long term (current) drug therapy: Secondary | ICD-10-CM | POA: Insufficient documentation

## 2018-04-04 DIAGNOSIS — R42 Dizziness and giddiness: Secondary | ICD-10-CM | POA: Diagnosis not present

## 2018-04-04 DIAGNOSIS — E785 Hyperlipidemia, unspecified: Secondary | ICD-10-CM

## 2018-04-04 DIAGNOSIS — K219 Gastro-esophageal reflux disease without esophagitis: Secondary | ICD-10-CM | POA: Diagnosis not present

## 2018-04-04 DIAGNOSIS — R221 Localized swelling, mass and lump, neck: Secondary | ICD-10-CM

## 2018-04-04 DIAGNOSIS — N3010 Interstitial cystitis (chronic) without hematuria: Secondary | ICD-10-CM | POA: Insufficient documentation

## 2018-04-04 LAB — CMP (CANCER CENTER ONLY)
ALBUMIN: 4.2 g/dL (ref 3.5–5.0)
ALK PHOS: 91 U/L (ref 38–126)
ALT: 38 U/L (ref 0–44)
ANION GAP: 10 (ref 5–15)
AST: 29 U/L (ref 15–41)
BILIRUBIN TOTAL: 1.3 mg/dL — AB (ref 0.3–1.2)
BUN: 13 mg/dL (ref 8–23)
CALCIUM: 10.5 mg/dL — AB (ref 8.9–10.3)
CO2: 26 mmol/L (ref 22–32)
CREATININE: 0.86 mg/dL (ref 0.44–1.00)
Chloride: 107 mmol/L (ref 98–111)
GFR, Est AFR Am: >60 mL/min (ref >60)
GFR, Estimated: >60 mL/min (ref >60)
GLUCOSE: 79 mg/dL (ref 70–99)
Potassium: 4.2 mmol/L (ref 3.5–5.1)
Sodium: 143 mmol/L (ref 135–145)
TOTAL PROTEIN: 7.4 g/dL (ref 6.5–8.1)

## 2018-04-04 LAB — CBC WITH DIFFERENTIAL/PLATELET
ABS IMMATURE GRANULOCYTES: 0.02 10*3/uL (ref 0.00–0.07)
BASOS PCT: 1 %
Basophils Absolute: 0.1 10*3/uL (ref 0.0–0.1)
EOS ABS: 0.2 10*3/uL (ref 0.0–0.5)
EOS PCT: 2 %
HCT: 45.8 % (ref 36.0–46.0)
HEMOGLOBIN: 15.2 g/dL — AB (ref 12.0–15.0)
Immature Granulocytes: 0 %
Lymphocytes Relative: 24 %
Lymphs Abs: 2 10*3/uL (ref 0.7–4.0)
MCH: 29.1 pg (ref 26.0–34.0)
MCHC: 33.2 g/dL (ref 30.0–36.0)
MCV: 87.7 fL (ref 80.0–100.0)
MONOS PCT: 8 %
Monocytes Absolute: 0.6 10*3/uL (ref 0.1–1.0)
Neutro Abs: 5.3 10*3/uL (ref 1.7–7.7)
Neutrophils Relative %: 65 %
PLATELETS: 194 10*3/uL (ref 150–400)
RBC: 5.22 MIL/uL — ABNORMAL HIGH (ref 3.87–5.11)
RDW: 13.2 % (ref 11.5–15.5)
WBC: 8.2 10*3/uL (ref 4.0–10.5)
nRBC: 0 % (ref 0.0–0.2)

## 2018-04-04 LAB — LACTATE DEHYDROGENASE: LDH: 240 U/L — ABNORMAL HIGH (ref 98–192)

## 2018-04-04 NOTE — Telephone Encounter (Signed)
Printed avs and calender of upcoming appointment. Per 10/24 los 

## 2018-04-04 NOTE — Progress Notes (Signed)
HEMATOLOGY/ONCOLOGY CONSULTATION NOTE  Date of Service: 04/04/2018  Patient Care Team: [name], MD as PCP - General (Internal Medicine) Stanford Breed [name], MD as PCP - Cardiology (Cardiology) [name], MD as Consulting Physician (Hematology) [name], RN as Oncology Nurse Navigator (Oncology)  CHIEF COMPLAINTS/PURPOSE OF CONSULTATION:  Follicular lymphoma   HISTORY OF PRESENTING ILLNESS:   Rachael Greer is a wonderful 77 y.o. female who has been referred to [name] by Dr. 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 She is accompanied today by a friend, and our Nurse Navigator [name]. The pt reports that she is doing well overall.   The pt reports that things began changing last September in 2018. She notes that she has taken Flonase, Claritin, and saline solution and did not have a runny nose, and did not have any breathing difficulty. She notes that her head felt as though she had a flu or a cold, without any other symptoms. She notes that she felt as if her ears were swollen and has had some minimal hearing impairment, and has myringotomy tubes. She began a steroid course about 4 months ago with Dr. [name]. She notes that she first noticed right sided neck swelling in the last month, and left sided neck swelling which began 2-3 weeks ago.   She obtained a CT Neck on 03/08/18, as noted below.   The pt notes that she has not had fevers or chills, but has woken up recently with night sweats occasionally over the last 6 months. She adds that she has noticed some SOB in the last 7-8 months which presents intermittently, and has not progressed. The pt notes that she has had some difficulty swallowing in the past, and saw Dr. [name] for concerns of a stricture, and adds that this she has had recent difficulty swallowing. The pt also notes that she has recently felt dizziness, described as the room spinning. She has not had her dizziness worked up.  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 She began tamoxifen preventatively afterwards for 2.5 years.   The pt notes that her interstitial cystitis cause is unknown. She notes that she took a TB vaccine 5-7 years ago, and developed severe pain and sepsis. She notes that she has not had recurrent UTIs and takes Elmiron under the care of Dr. [name] in urology. She notes that there are certain foods that she cannot eat without developing bladder pain. She also denies blood in the urine.  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 2. RIGHT lymphadenitis seen with superimposed infection or pericapsular spread of tumor.  Most recent lab results (03/08/18) of CBC w/diff and BMP is as follows: all values are WNL except for RBC at 5.22, HGB at 15.3.  On review of systems, pt reports some night sweats, some SOB, right neck swelling, left neck swelling, dizziness, hearing impairment, healed incision, right ear numbness, and denies fevers, chills, runny nose, abdominal pains, abdominal distension, blood in the urine, sinus pressure or pain, pain along the spine, leg swelling, skin rashes, and any other symptoms.   On PMHx the pt reports breast cancer in 2013, sternocleidomastoid release in childhood, interstitial cystitis, sepsis. On Social Hx the pt denies ever smoking  cigarettes and denies ever consuming alcohol   MEDICAL HISTORY:  Past Medical History:  Diagnosis Date  . Anxiety   . Breast cancer ([place])   . Chest pain   . Depression   . DOE (dyspnea on exertion)   . Eustachian tube obstruction, bilateral   . Fibromyalgia   . Gastric reflux   . History of kidney stones   . Hyperlipidemia   . Hypertension     . IBS (irritable bowel syndrome)   . Interstitial cystitis   . Nephrolithiasis   . Nephrolithiasis   . Palpitations   . Pre-diabetes   . Sepsis ([place])     SURGICAL HISTORY: Past Surgical History:  Procedure Laterality Date  . BREAST DUCTAL SYSTEM EXCISION Right 10/30/2016   Procedure: RIGHT BREAST DUCT EXCISION;  Surgeon: Rolm Bookbinder, MD;  Location: [facility];  Service: General;  Laterality: Right;  . EYE SURGERY     bilateral lense placements  . MASTECTOMY Left   . NECK SURGERY     muscles cut in her neck  . NEPHROSTOMY    . TONSILLECTOMY      SOCIAL HISTORY: Social History   Socioeconomic History  . Marital status: Widowed    Spouse name: Not on file  . Number of children: 4  . Years of education: Not on file  . Highest education level: Not on file  Occupational History  . Not on file  Social Needs  . Financial resource strain: Not on file  . Food insecurity:    Worry: Not on file    Inability: Not on file  . Transportation needs:    Medical: Not on file    Non-medical: Not on file  Tobacco Use  . Smoking status: Never Smoker  . Smokeless tobacco: Never Used  Substance and Sexual Activity  . Alcohol use: No  . Drug use: No  . Sexual activity: Not on file  Lifestyle  . Physical activity:    Days per week: Not on file    Minutes per session: Not on file  . Stress: Not on file  Relationships  . Social connections:    Talks on phone: Not on file    Gets together: Not on file    Attends religious service: Not on file    Active member of club or organization: Not on file    Attends meetings of clubs or organizations: Not on file    Relationship status: Not on file  . Intimate partner violence:    Fear of current or ex partner: Not on file    Emotionally abused: Not on file    Physically abused: Not on file    Forced sexual activity: Not on file  Other Topics Concern  . Not on file  Social History Narrative  . Not on file    FAMILY  HISTORY: Family History  Problem Relation Age of Onset  . CAD Father   . Heart attack Father   . Dementia Mother     ALLERGIES:  is allergic to amoxicillin and no known allergies.  MEDICATIONS:  Current Outpatient Medications  Medication Sig Dispense Refill  . amLODipine (NORVASC) 2.5 MG tablet Take 2.5 mg by mouth daily.    Marland Kitchen atorvastatin (LIPITOR) 20 MG tablet Take 20 mg by mouth once a week.     . clonazePAM (KLONOPIN) 1 MG tablet Take 1 mg by mouth 2 (two) times daily as needed for anxiety.     . metoprolol succinate (TOPROL XL) 25 MG  24 hr tablet Take 1 tablet (25 mg total) by mouth daily. (Patient taking differently: Take 25 mg by mouth every evening. ) 90 tablet 3  . omeprazole (PRILOSEC) 40 MG capsule Take 40 mg by mouth daily.    Marland Kitchen PAZEO 0.7 % SOLN INT 1 GTT INTO OU D  6  . pentosan polysulfate (ELMIRON) 100 MG capsule Take 100 mg by mouth 3 (three) times daily.     Vladimir Faster Glycol-Propyl Glycol (SYSTANE OP) Apply 1 drop to eye 2 (two) times daily.    . Ibuprofen-Diphenhydramine HCl (ADVIL PM) 200-25 MG CAPS Take 0.5-1 tablets by mouth daily as needed (sleep).     No current facility-administered medications for this visit.     REVIEW OF SYSTEMS:    10 Point review of Systems was done is negative except as noted above.  PHYSICAL EXAMINATION: ECOG PERFORMANCE STATUS: 2 - Symptomatic, <50% confined to bed  . Vitals:   04/04/18 1412  BP: 127/78  Pulse: 80  Resp: 18  Temp: 97.8 F (36.6 C)  SpO2: 100%   Filed Weights   04/04/18 1412  Weight: 160 lb 6.4 oz (72.8 kg)   .Body mass index is 28.41 kg/m.  GENERAL:alert, in no acute distress and comfortable SKIN: no acute rashes, no significant lesions EYES: conjunctiva are pink and non-injected, sclera anicteric OROPHARYNX: MMM, no exudates, no oropharyngeal erythema or ulceration NECK: supple, no JVD LYMPH:  Palpable lymphadenopathy in the left cervical, 4-5cm palpable LN in right cervical, no palpable  lymphadenopathy in the axillary or inguinal regions LUNGS: clear to auscultation b/l with normal respiratory effort HEART: regular rate & rhythm ABDOMEN:  normoactive bowel sounds , non tender, not distended. Extremity: no pedal edema PSYCH: alert & oriented x 3 with fluent speech NEURO: no focal motor/sensory deficits  LABORATORY DATA:  I have reviewed the data as listed  . CBC Latest Ref Rng & Units 04/04/2018 03/08/2018 11/02/2017  WBC 4.0 - 10.5 K/uL 8.2 8.1 7.0  Hemoglobin 12.0 - 15.0 g/dL 15.2(H) 15.3(H) 16.3(H)  Hematocrit 36.0 - 46.0 % 45.8 43.8 46.7(H)  Platelets 150 - 400 K/uL 194 169 192    . CMP Latest Ref Rng & Units 04/04/2018 03/08/2018 10/25/2016  Glucose 70 - 99 mg/dL 79 90 86  BUN 8 - 23 mg/dL 13 16 18   Creatinine 0.44 - 1.00 mg/dL 0.86 0.69 0.88  Sodium 135 - 145 mmol/L 143 140 138  Potassium 3.5 - 5.1 mmol/L 4.2 3.8 4.0  Chloride 98 - 111 mmol/L 107 106 107  CO2 22 - 32 mmol/L 26 24 24   Calcium 8.9 - 10.3 mg/dL 10.5(H) 10.2 10.0  Total Protein 6.5 - 8.1 g/dL 7.4 - -  Total Bilirubin 0.3 - 1.2 mg/dL 1.3(H) - -  Alkaline Phos 38 - 126 U/L 91 - -  AST 15 - 41 U/L 29 - -  ALT 0 - 44 U/L 38 - -    03/22/18 Flow Cytometry:   03/22/18 Biopsy:     RADIOGRAPHIC STUDIES: I have personally reviewed the radiological images as listed and agreed with the findings in the report. Ct Soft Tissue Neck W Contrast  Result Date: 03/08/2018 CLINICAL DATA:  RIGHT-sided neck pain for a day. Congestion and ear pain for 10 months. History of breast cancer, neck surgery, tonsillectomy. EXAM: CT NECK WITH CONTRAST TECHNIQUE: Multidetector CT imaging of the neck was performed using the standard protocol following the bolus administration of intravenous contrast. CONTRAST:  51mL ISOVUE-300 IOPAMIDOL (ISOVUE-300) INJECTION 61% COMPARISON:  Sinus CT February 05, 2018 FINDINGS: PHARYNX AND LARYNX: Asymmetric fullness nasopharyngeal soft tissues effacing the nasal choana. Otherwise normal  pharynx and larynx. SALIVARY GLANDS: Normal. THYROID: Normal. LYMPH NODES: 15 x 18 mm RIGHT lateral pharyngeal lymph node. Bilateral level 2 B lymphadenopathy measuring to 17 mm. RIGHT level 3 lymphadenopathy measuring to 17 mm. Bilateral supraclavicular lymphadenopathy measuring to 13 mm. VASCULAR: Mild calcific atherosclerosis carotid siphon. LIMITED INTRACRANIAL: Normal. VISUALIZED ORBITS: Status post bilateral ocular lens implants. MASTOIDS AND VISUALIZED PARANASAL SINUSES: Well-aerated. SKELETON: Nonacute.  No destructive bony lesions. UPPER CHEST: Lung apices are clear. No superior mediastinal lymphadenopathy. OTHER: None. IMPRESSION: 1. 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 2. RIGHT lymphadenitis seen with superimposed infection or pericapsular spread of tumor. 3. Acute findings discussed with and reconfirmed by Dr.RACHEL LITTLE on 03/08/2018 at 10:22 pm. Electronically Signed   By: Elon Alas M.D.   On: 03/08/2018 22:24    ASSESSMENT & PLAN:   77 y.o. female with  1. Follicular lymphoma- Grade 3a PLAN -Discussed patient's most recent labs from 03/08/18, HGB at 15.3, no leukocytosis, no thrombocytopenia, normal blood chemistries.  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 2. RIGHT lymphadenitis seen with superimposed infection or pericapsular spread of tumor.  -Discussed the 03/22/18 biopsy which revealed follicular lymphoma, Grade 3a -Discussed the diagnosis, natural history, indications for treatment, possible treatments, and impending work up -Interstitial cystitis concerns noted, may need urology's input from Dr. Alona Bene regarding potential chemotherapy regimens  -Would  not recommend R-CHOP regimen  -If urology clearance is given, would treat with Bendamustine and Rituxan vs Rituxan alone  -Will order PET/CT -Will order labs today   Labs today PET/CT in 4-5 days RTC with Dr Irene Limbo in 7 days   All of the patients questions were answered with apparent satisfaction. The patient knows to call the clinic with any problems, questions or concerns.  The total time spent in the appt was 60 minutes and more than 50% was on counseling and direct patient cares.    Sullivan Lone MD MS AAHIVMS [facility] Hematology/Oncology Physician [facility]  (Office):       [phone] (Work cell):  [phone] (Fax):           [phone]  04/04/2018 3:40 PM  I, Baldwin [place], am acting as a scribe for Dr. Irene Limbo  .I have reviewed the above documentation for accuracy and completeness, and I agree with the above. [name] MD

## 2018-04-05 LAB — HEPATITIS C ANTIBODY

## 2018-04-05 LAB — HEPATITIS B CORE ANTIBODY, TOTAL: HEP B C TOTAL AB: NEGATIVE

## 2018-04-05 LAB — HEPATITIS B SURFACE ANTIGEN: Hepatitis B Surface Ag: NEGATIVE

## 2018-04-05 NOTE — Progress Notes (Signed)
Oncology Nurse Navigator Documentation  Met with Ms. Klose during initial consult with Dr. Maylon Peppers. She was accompanied by friend Mateo Flow. . Further introduced myself as her Navigator, explained my role as a member of the Care Team. . Provided New Patient Information packet: o Contact information for physicians, this navigator. o Advance Directive information ([place] blue pamphlet with LCSW insert).  She indicated has both, will bring copies. o Fall Prevention Patient Safety Plan o Appointment Guideline o Financial Assistance Information sheet o SLP information sheet o Symptom Management Clinic information o [facility] campus map with highlight of [place] . Assisted with post-consult labs. . She voiced understanding of: Marland Kitchen Systemic tmt options based on results of pending PET, her other medical issues. . Case to be presented at next H&N TB for discussion. Marland Kitchen PET to be ordered/scheduled.  I encouraged them to call with questions/concerns, they verbalized understanding.  [name], RN, BSN Head & Neck Oncology Nurse [place] at [place] [phone]

## 2018-04-10 ENCOUNTER — Telehealth: Payer: Self-pay | Admitting: *Deleted

## 2018-04-10 ENCOUNTER — Telehealth: Payer: Self-pay | Admitting: Hematology

## 2018-04-10 ENCOUNTER — Inpatient Hospital Stay: Payer: Medicare Other | Admitting: Hematology

## 2018-04-10 NOTE — Telephone Encounter (Signed)
Scheduled appt per 10/30 sch message -left message for patient with appt date and time and sent reminder letter in the mail

## 2018-04-10 NOTE — Telephone Encounter (Signed)
A user error has taken place: encounter opened in error, closed for administrative reasons.

## 2018-04-10 NOTE — Telephone Encounter (Signed)
Oncology Nurse Navigator Documentation  Spoke with Rachael Greer, informed her today's appt with Dr. Irene Limbo is being rescheduled for the week of 11/18 bc PET not completed.  Informed her PET scheduled 11/6 [facility], 2:30 arrival, NPO 6 hrs prior.  She voiced understanding.  [name], RN, BSN Head & Neck Oncology Nurse [place] at [place] [phone]

## 2018-04-16 ENCOUNTER — Telehealth: Payer: Self-pay | Admitting: *Deleted

## 2018-04-16 NOTE — Telephone Encounter (Signed)
Oncology Nurse Navigator Documentation  Returned Ms. Reamy's call, confirmed understanding of 2:30 arrival [facility] for PET, NPO status 6 hrs prior.  [name], RN, BSN Head & Neck Oncology Nurse [place] at [place] [phone]

## 2018-04-17 ENCOUNTER — Ambulatory Visit (HOSPITAL_COMMUNITY)
Admission: RE | Admit: 2018-04-17 | Discharge: 2018-04-17 | Disposition: A | Discharge status: Home / Self Care | Payer: Medicare Other | Source: Ambulatory Visit | Attending: Hematology | Admitting: Hematology

## 2018-04-17 ENCOUNTER — Telehealth: Payer: Self-pay | Admitting: *Deleted

## 2018-04-17 DIAGNOSIS — C8290 Follicular lymphoma, unspecified, unspecified site: Secondary | ICD-10-CM | POA: Diagnosis not present

## 2018-04-17 DIAGNOSIS — C8231 Follicular lymphoma grade IIIa, lymph nodes of head, face, and neck: Secondary | ICD-10-CM | POA: Insufficient documentation

## 2018-04-17 LAB — GLUCOSE, CAPILLARY: Glucose-Capillary: 78 mg/dL (ref 70–99)

## 2018-04-17 MED ORDER — FLUDEOXYGLUCOSE F - 18 (FDG) INJECTION
7.8500 | Freq: Once | INTRAVENOUS | Status: AC | PRN
  Administered 2018-04-17: 7.85 via INTRAVENOUS

## 2018-04-17 NOTE — Telephone Encounter (Signed)
Patient left VM: Received call from this office she thought, but not sure as to what message was about. Stated she would be home after 4 and to call back then (havig PET scan). Returned call at at 4:50, unable to leave message. Will attempt contact tomorrow.

## 2018-04-18 ENCOUNTER — Telehealth: Payer: Self-pay | Admitting: *Deleted

## 2018-04-18 NOTE — Telephone Encounter (Signed)
Oncology Nurse Navigator Documentation  Received [phone] call from Ms. Ellithorpe expressing high anxiety re 11/27 follow-up appt with Dr. Irene Limbo, noted neck mass starting to feel "hard" vs previous "soft".  Called her back to inform her appt rescheduled for 11/19 10:30.  She voiced appreciation.  [name], RN, BSN Head & Neck Oncology Nurse [place] at [place] [phone]

## 2018-04-18 NOTE — Telephone Encounter (Signed)
Attempted to contact patient again today at number left [phone]. Unable to leave message - directed to call again per recording.

## 2018-04-29 NOTE — Progress Notes (Signed)
HEMATOLOGY/ONCOLOGY CONSULTATION NOTE  Date of Service: 04/30/2018  Patient Care Team: [name], MD as PCP - General (Internal Medicine) Stanford Breed [name], MD as PCP - Cardiology (Cardiology) [name], MD as Consulting Physician (Hematology) [name], RN as Oncology Nurse Navigator (Oncology)  CHIEF COMPLAINTS/PURPOSE OF CONSULTATION:  Follicular lymphoma   HISTORY OF PRESENTING ILLNESS:   Rachael Greer is a wonderful 77 y.o. female who has been referred to [name] by Dr. 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 She is accompanied today by a friend, and our Nurse Navigator [name]. The pt reports that she is doing well overall.   The pt reports that things began changing last September in 2018. She notes that she has taken Flonase, Claritin, and saline solution and did not have a runny nose, and did not have any breathing difficulty. She notes that her head felt as though she had a flu or a cold, without any other symptoms. She notes that she felt as if her ears were swollen and has had some minimal hearing impairment, and has myringotomy tubes. She began a steroid course about 4 months ago with Dr. [name]. She notes that she first noticed right sided neck swelling in the last month, and left sided neck swelling which began 2-3 weeks ago.   She obtained a CT Neck on 03/08/18, as noted below.   The pt notes that she has not had fevers or chills, but has woken up recently with night sweats occasionally over the last 6 months. She adds that she has noticed some SOB in the last 7-8 months which presents intermittently, and has not progressed. The pt notes that she has had some difficulty swallowing in the past, and saw Dr. [name] for concerns of a stricture, and adds that this she has had recent difficulty swallowing. The pt also notes that she has recently felt dizziness, described as the room spinning. She has not had her dizziness worked up.  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 She began tamoxifen preventatively afterwards for 2.5 years.   The pt notes that her interstitial cystitis cause is unknown. She notes that she took a TB vaccine 5-7 years ago, and developed severe pain and sepsis. She notes that she has not had recurrent UTIs and takes Elmiron under the care of Dr. [name] in urology. She notes that there are certain foods that she cannot eat without developing bladder pain. She also denies blood in the urine.  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 2. RIGHT lymphadenitis seen with superimposed infection or pericapsular spread of tumor.  Most recent lab results (03/08/18) of CBC w/diff and BMP is as follows: all values are WNL except for RBC at 5.22, HGB at 15.3.  On review of systems, pt reports some night sweats, some SOB, right neck swelling, left neck swelling, dizziness, hearing impairment, healed incision, right ear numbness, and denies fevers, chills, runny nose, abdominal pains, abdominal distension, blood in the urine, sinus pressure or pain, pain along the spine, leg swelling, skin rashes, and any other symptoms.   On PMHx the pt reports breast cancer in 2013, sternocleidomastoid release in childhood, interstitial cystitis, sepsis. 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 The patient's last visit with [name] was on 04/04/18. She is accompanied today by a friend and our Nurse Navigator, [name]. The pt reports that she is doing well overall.   The pt reports that she  has not developed any new concerns in the interim. She has not noticed any new lumps or bumps and has not developed any fevers or chills. She continues to have feelings of nasal fullness and congestion. She notes that she can hear her voice as an echo.   The pt notes that her interstitial cystitis is quite sensitive and she was not able to see her Urologist Dr. Alona Bene in the interim as we previously discussed. She has received bladder installations in the past. She notes that bananas and other specific foods have been most associated with her IC symptomatology.   Of note since the patient's last visit, pt has had a PET/CT completed on 04/17/18 with results revealing Notable bilateral adenopathy in the neck and posterior nasopharynx is hypermetabolic and mostly Deauville 5 level activity, compatible with malignancy. 2. No pathologically enlarged or significantly hypermetabolic adenopathy in the chest, or abdomen/pelvis. No splenomegaly. Slight enlargement of the photopenic splenic cyst noted on remote prior Exams. 3. Other imaging findings of potential clinical significance: Aortic Atherosclerosis. Coronary atherosclerosis. Diffuse hepatic steatosis. Nonobstructive left nephrolithiasis.  Lab results (04/04/18) of CBC w/diff, CMP, and Reticulocytes is as follows: all values are WNL except for RBC at 5.22, HGB at 15.2, Calcium at 10.5, Total Bilirubin at 1.3. 04/04/18 LDH at 240  On review of systems, pt reports feelings of nasal fullness, stable energy levels, and denies abdominal pains, current bladder symptoms, leg swelling, and any other symptoms.   MEDICAL HISTORY:  Past Medical History:  Diagnosis Date  . Anxiety   . Breast cancer ([place])   . Chest pain   . Depression   . DOE (dyspnea on exertion)   . Eustachian tube obstruction, bilateral   . Fibromyalgia   . Gastric reflux   . History of kidney stones   . Hyperlipidemia   . Hypertension   . IBS (irritable bowel syndrome)   .  Interstitial cystitis   . Nephrolithiasis   . Nephrolithiasis   . Palpitations   . Pre-diabetes   . Sepsis ([place])     SURGICAL HISTORY: Past Surgical History:  Procedure Laterality Date  . BREAST DUCTAL SYSTEM EXCISION Right 10/30/2016   Procedure: RIGHT BREAST DUCT EXCISION;  Surgeon: Rolm Bookbinder, MD;  Location: [facility];  Service: General;  Laterality: Right;  . EYE SURGERY     bilateral lense placements  . MASTECTOMY Left   . NECK SURGERY     muscles cut in her neck  . NEPHROSTOMY    . TONSILLECTOMY      SOCIAL HISTORY: Social History   Socioeconomic History  . Marital status: Widowed    Spouse name: Not on file  . Number of children: 4  . Years of education: Not on file  . Highest education level: Not on file  Occupational History  . Not on file  Social Needs  . Financial resource strain: Not on file  . Food insecurity:    Worry: Not on file    Inability: Not on file  . Transportation needs:    Medical: Not on file    Non-medical: Not on file  Tobacco  Use  . Smoking status: Never Smoker  . Smokeless tobacco: Never Used  Substance and Sexual Activity  . Alcohol use: No  . Drug use: No  . Sexual activity: Not on file  Lifestyle  . Physical activity:    Days per week: Not on file    Minutes per session: Not on file  . Stress: Not on file  Relationships  . Social connections:    Talks on phone: Not on file    Gets together: Not on file    Attends religious service: Not on file    Active member of club or organization: Not on file    Attends meetings of clubs or organizations: Not on file    Relationship status: Not on file  . Intimate partner violence:    Fear of current or ex partner: Not on file    Emotionally abused: Not on file    Physically abused: Not on file    Forced sexual activity: Not on file  Other Topics Concern  . Not on file  Social History Narrative  . Not on file    FAMILY HISTORY: Family History  Problem Relation Age of  Onset  . CAD Father   . Heart attack Father   . Dementia Mother     ALLERGIES:  is allergic to amoxicillin and no known allergies.  MEDICATIONS:  Current Outpatient Medications  Medication Sig Dispense Refill  . amLODipine (NORVASC) 2.5 MG tablet Take 2.5 mg by mouth daily.    Marland Kitchen atorvastatin (LIPITOR) 20 MG tablet Take 20 mg by mouth once a week.     . clonazePAM (KLONOPIN) 1 MG tablet Take 1 mg by mouth 2 (two) times daily as needed for anxiety.     . Ibuprofen-Diphenhydramine HCl (ADVIL PM) 200-25 MG CAPS Take 0.5-1 tablets by mouth daily as needed (sleep).     . metoprolol succinate (TOPROL XL) 25 MG 24 hr tablet Take 1 tablet (25 mg total) by mouth daily. (Patient taking differently: Take 25 mg by mouth every evening. ) 90 tablet 3  . omeprazole (PRILOSEC) 40 MG capsule Take 40 mg by mouth daily.    Marland Kitchen PAZEO 0.7 % SOLN INT 1 GTT INTO OU D  6  . pentosan polysulfate (ELMIRON) 100 MG capsule Take 100 mg by mouth 3 (three) times daily.     Vladimir Faster Glycol-Propyl Glycol (SYSTANE OP) Apply 1 drop to eye 2 (two) times daily.     No current facility-administered medications for this visit.     REVIEW OF SYSTEMS:    A 10+ POINT REVIEW OF SYSTEMS WAS OBTAINED including neurology, dermatology, psychiatry, cardiac, respiratory, lymph, extremities, GI, GU, Musculoskeletal, constitutional, breasts, reproductive, HEENT.  All pertinent positives are noted in the HPI.  All others are negative.   PHYSICAL EXAMINATION: ECOG PERFORMANCE STATUS: 2 - Symptomatic, <50% confined to bed  . Vitals:   04/30/18 1024  BP: (!) 116/58  Pulse: 71  Resp: 19  Temp: 98.2 F (36.8 C)  SpO2: 100%   Filed Weights   04/30/18 1024  Weight: 161 lb 11.2 oz (73.3 kg)   .Body mass index is 28.64 kg/m.  GENERAL:alert, in no acute distress and comfortable SKIN: no acute rashes, no significant lesions EYES: conjunctiva are pink and non-injected, sclera anicteric OROPHARYNX: MMM, no exudates, no  oropharyngeal erythema or ulceration NECK: supple, no JVD LYMPH:  Palpable lymphadenopathy in the left cervical, 4-5cm palpable LN in right cervical, no palpable lymphadenopathy in the axillary or inguinal  regions LUNGS: clear to auscultation b/l with normal respiratory effort HEART: regular rate & rhythm ABDOMEN:  normoactive bowel sounds , non tender, not distended. No palpable hepatosplenomegaly.  Extremity: no pedal edema PSYCH: alert & oriented x 3 with fluent speech NEURO: no focal motor/sensory deficits   LABORATORY DATA:  I have reviewed the data as listed  . CBC Latest Ref Rng & Units 04/04/2018 03/08/2018 11/02/2017  WBC 4.0 - 10.5 K/uL 8.2 8.1 7.0  Hemoglobin 12.0 - 15.0 g/dL 15.2(H) 15.3(H) 16.3(H)  Hematocrit 36.0 - 46.0 % 45.8 43.8 46.7(H)  Platelets 150 - 400 K/uL 194 169 192    . CMP Latest Ref Rng & Units 04/04/2018 03/08/2018 10/25/2016  Glucose 70 - 99 mg/dL 79 90 86  BUN 8 - 23 mg/dL 13 16 18   Creatinine 0.44 - 1.00 mg/dL 0.86 0.69 0.88  Sodium 135 - 145 mmol/L 143 140 138  Potassium 3.5 - 5.1 mmol/L 4.2 3.8 4.0  Chloride 98 - 111 mmol/L 107 106 107  CO2 22 - 32 mmol/L 26 24 24   Calcium 8.9 - 10.3 mg/dL 10.5(H) 10.2 10.0  Total Protein 6.5 - 8.1 g/dL 7.4 - -  Total Bilirubin 0.3 - 1.2 mg/dL 1.3(H) - -  Alkaline Phos 38 - 126 U/L 91 - -  AST 15 - 41 U/L 29 - -  ALT 0 - 44 U/L 38 - -    03/22/18 Flow Cytometry:   03/22/18 Biopsy:     RADIOGRAPHIC STUDIES: I have personally reviewed the radiological images as listed and agreed with the findings in the report. Nm Pet Image Initial (pi) Skull Base To Thigh  Result Date: 04/17/2018 CLINICAL DATA:  Initial treatment strategy for follicular lymphoma. EXAM: NUCLEAR MEDICINE PET SKULL BASE TO THIGH TECHNIQUE: 7.8 mCi F-18 FDG was injected intravenously. Full-ring PET imaging was performed from the skull base to thigh after the radiotracer. CT data was obtained and used for attenuation correction and anatomic  localization. Fasting blood glucose: 78 mg/dl COMPARISON:  CT neck 03/08/2018 FINDINGS: Mediastinal blood pool activity: SUV max 2.5 Background hepatic activity: SUV max 3.5 NECK: Bilateral prominent posterior nasopharyngeal soft tissue appears hypermetabolic and roughly bilaterally symmetric, maximum SUV 18.3. Right parapharyngeal hypermetabolic activity has a maximum SUV of 11.9 and measures proximally 2.0 cm in diameter. Bilateral conglomerate level IIa and IIb adenopathy along with bilateral V adenopathy, bilateral III adenopathy, and bilateral level IV adenopathy. There is likewise mildly hypermetabolic bilateral supraclavicular adenopathy at the junction of the neck and chest. Right level II adenopathy on image 23/4 measures 2.0 cm in short axis (formerly 1.9 cm by my measurement on 03/08/2018) with maximum SUV 10.3. A right level III lymph node measuring 1.9 cm in short axis on image 31/4 (stable) has a maximum SUV of 11.1. A left posterior level V lymph node measuring 0.9 cm in short axis on image 36/4 (formerly 1.1 cm) has a maximum SUV of 4.6. Incidental CT findings: none CHEST: Small left axillary lymph nodes with low-grade activity are identified. An index node measuring 0.8 cm in short axis on image 50/4 has a maximum SUV of 2.2. No pathologic mediastinal or hilar adenopathy. Incidental CT findings: Left mastectomy. Coronary, aortic arch, and branch vessel atherosclerotic vascular disease. ABDOMEN/PELVIS: No significant abnormal hypermetabolic activity in this region. Incidental CT findings: Mild diffuse hepatic steatosis. Aortoiliac atherosclerotic vascular disease. Nonobstructive left nephrolithiasis. The spleen measures 12.7 by 4.3 by 12.0 cm (volume = 340 cm^3) and has a 2.8 by 2.2 cm partially exophytic  fluid density lesion along its anterior margin which previously measured 2.1 by 1.6 cm back on 02/06/2013. This lesion appears photopenic. SKELETON: No significant abnormal hypermetabolic activity  in this region. Incidental CT findings: none IMPRESSION: 1. Notable bilateral adenopathy in the neck and posterior nasopharynx is hypermetabolic and mostly Deauville 5 level activity, compatible with malignancy. 2. No pathologically enlarged or significantly hypermetabolic adenopathy in the chest, or abdomen/pelvis. No splenomegaly. Slight enlargement of the photopenic splenic cyst noted on remote prior exams. 3. Other imaging findings of potential clinical significance: Aortic Atherosclerosis (ICD10-I70.0). Coronary atherosclerosis. Diffuse hepatic steatosis. Nonobstructive left nephrolithiasis. Electronically Signed   By: Van Clines M.D.   On: 04/17/2018 16:59    ASSESSMENT & PLAN:   77 y.o. female with  1. 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 2. RIGHT lymphadenitis seen with superimposed infection or pericapsular spread of tumor.    03/22/18 biopsy revealed follicular lymphoma, Grade 3a   PLAN: -Discussed pt labwork from 04/04/18; blood counts and chemistries are stable  -04/04/18 Hep B and Hep C negative  -Discussed the 04/17/18 PET/CT which revealed Notable bilateral adenopathy in the neck and posterior nasopharynx is hypermetabolic and mostly Deauville 5 level activity, compatible with malignancy. 2. No pathologically enlarged or significantly hypermetabolic adenopathy in the chest, or abdomen/pelvis. No splenomegaly. Slight enlargement of the photopenic splenic cyst noted on remote prior Exams. 3. Other imaging findings of potential clinical significance: Aortic Atherosclerosis. Coronary atherosclerosis. Diffuse hepatic steatosis. Nonobstructive left nephrolithiasis. -Discussed the recommended treatment of either Bendamustine and Rituxan for 4-6 cycles vs Rituxan alone. Would not treat with  R-CHOP given interstitial cystitis. -Need urology clearance for Bendamustine treatment, and prefer active management of interstitial cystitis during treatment from Dr. Alona Bene, given interstitial cystitis and presence of chemotherapy  -Discussed that the intent of treatment is to achieve remission, is not curative  -Will set the pt up for chemotherapy counseling -Will set pt up for first dose of Rituxan alone, and then will see pt back in 3 weeks to evaluate tolerance    Chemotherapy counseling for Bendamustine/Rituxan C1 with only Rituxan to start in 1 week with labs RTC with Dr Irene Limbo in 3 weeks with labs for a toxicity check F/u with Urology Dr [name] for mx of interstitial cystitis   All of the patients questions were answered with apparent satisfaction. The patient knows to call the clinic with any problems, questions or concerns.  The total time spent in the appt was 45 minutes and more than 50% was on counseling and direct patient cares.    Sullivan Lone MD MS AAHIVMS [facility] Hematology/Oncology Physician [facility]  (Office):       [phone] (Work cell):  [phone] (Fax):           [phone]  04/30/2018 11:59 AM  I, Baldwin [place], am acting as a scribe for Dr. Sullivan Lone.   .I have reviewed the above documentation for accuracy and completeness, and I agree with the above. [name] MD

## 2018-04-30 ENCOUNTER — Telehealth: Payer: Self-pay | Admitting: Hematology

## 2018-04-30 ENCOUNTER — Encounter: Payer: Self-pay | Admitting: Hematology

## 2018-04-30 ENCOUNTER — Encounter: Payer: Self-pay | Admitting: *Deleted

## 2018-04-30 ENCOUNTER — Inpatient Hospital Stay: Payer: Medicare Other | Attending: Hematology | Admitting: Hematology

## 2018-04-30 VITALS — BP 116/58 | HR 71 | Temp 98.2°F | Resp 19 | Ht 63.0 in | Wt 161.7 lb

## 2018-04-30 DIAGNOSIS — R42 Dizziness and giddiness: Secondary | ICD-10-CM

## 2018-04-30 DIAGNOSIS — R61 Generalized hyperhidrosis: Secondary | ICD-10-CM | POA: Diagnosis not present

## 2018-04-30 DIAGNOSIS — E785 Hyperlipidemia, unspecified: Secondary | ICD-10-CM | POA: Diagnosis not present

## 2018-04-30 DIAGNOSIS — R7303 Prediabetes: Secondary | ICD-10-CM | POA: Insufficient documentation

## 2018-04-30 DIAGNOSIS — C8231 Follicular lymphoma grade IIIa, lymph nodes of head, face, and neck: Secondary | ICD-10-CM | POA: Insufficient documentation

## 2018-04-30 DIAGNOSIS — Z5112 Encounter for antineoplastic immunotherapy: Secondary | ICD-10-CM | POA: Insufficient documentation

## 2018-04-30 DIAGNOSIS — N3010 Interstitial cystitis (chronic) without hematuria: Secondary | ICD-10-CM | POA: Insufficient documentation

## 2018-04-30 DIAGNOSIS — M797 Fibromyalgia: Secondary | ICD-10-CM

## 2018-04-30 DIAGNOSIS — Z791 Long term (current) use of non-steroidal anti-inflammatories (NSAID): Secondary | ICD-10-CM | POA: Diagnosis not present

## 2018-04-30 DIAGNOSIS — Z79899 Other long term (current) drug therapy: Secondary | ICD-10-CM | POA: Diagnosis not present

## 2018-04-30 DIAGNOSIS — K760 Fatty (change of) liver, not elsewhere classified: Secondary | ICD-10-CM | POA: Diagnosis not present

## 2018-04-30 DIAGNOSIS — I2510 Atherosclerotic heart disease of native coronary artery without angina pectoris: Secondary | ICD-10-CM | POA: Insufficient documentation

## 2018-04-30 DIAGNOSIS — Z9012 Acquired absence of left breast and nipple: Secondary | ICD-10-CM

## 2018-04-30 DIAGNOSIS — R0602 Shortness of breath: Secondary | ICD-10-CM

## 2018-04-30 DIAGNOSIS — I10 Essential (primary) hypertension: Secondary | ICD-10-CM | POA: Insufficient documentation

## 2018-04-30 DIAGNOSIS — C8298 Follicular lymphoma, unspecified, lymph nodes of multiple sites: Secondary | ICD-10-CM

## 2018-04-30 DIAGNOSIS — Z853 Personal history of malignant neoplasm of breast: Secondary | ICD-10-CM

## 2018-04-30 DIAGNOSIS — H9190 Unspecified hearing loss, unspecified ear: Secondary | ICD-10-CM | POA: Insufficient documentation

## 2018-04-30 NOTE — Telephone Encounter (Signed)
Printed calendar and avs. °

## 2018-04-30 NOTE — Patient Instructions (Signed)
Thank you for choosing [facility] to provide your oncology and hematology care.  To afford each patient quality time with our providers, please arrive 30 minutes before your scheduled appointment time.  If you arrive late for your appointment, you may be asked to reschedule.  We strive to give you quality time with our providers, and arriving late affects you and other patients whose appointments are after yours.    If you are a no show for multiple scheduled visits, you may be dismissed from the clinic at the providers discretion.     Again, thank you for choosing [facility], our hope is that these requests will decrease the amount of time that you wait before being seen by our physicians.  ______________________________________________________________________   Should you have questions after your visit to the [facility], please contact our office at [phone] between the hours of 8:30 and 4:30 p.m.    Voicemails left after 4:30p.m will not be returned until the following business day.     For prescription refill requests, please have your pharmacy contact us directly.  Please also try to allow 48 hours for prescription requests.     Please contact the scheduling department for questions regarding scheduling.  For scheduling of procedures such as PET scans, CT scans, MRI, Ultrasound, etc please contact central scheduling at [phone].     Resources For Cancer Patients and Caregivers:    Oncolink.org:  A wonderful resource for patients and healthcare providers for information regarding your disease, ways to tract your treatment, what to expect, etc.      American Cancer Society:  800-227-2345  Can help patients locate various types of support and financial assistance   Cancer Care: 1-800-813-HOPE (4673) Provides financial assistance, online support groups, medication/co-pay assistance.     [place] DSS:  336-641-3447 Where to apply  for food stamps, Medicaid, and utility assistance   Medicare Rights Center: 800-333-4114 Helps people with Medicare understand their rights and benefits, navigate the Medicare system, and secure the quality healthcare they deserve   SCAT: 336-333-6589 [place] [employer]'s shared-ride transportation service for eligible riders who have a disability that prevents them from riding the fixed route bus.     For additional information on assistance programs please contact our social worker:   Abigail Elmore:  [phone]  

## 2018-05-02 ENCOUNTER — Inpatient Hospital Stay: Payer: Medicare Other

## 2018-05-02 ENCOUNTER — Telehealth: Payer: Self-pay | Admitting: *Deleted

## 2018-05-02 NOTE — Telephone Encounter (Signed)
Oncology Nurse Navigator Documentation  Called office of Dr. Domingo Pulse, pt's urologist, re pending tmt at [facility].  LVMM requesting call-back.  [name], RN, BSN Head & Neck Oncology Nurse [place] at [place] [phone]

## 2018-05-02 NOTE — Telephone Encounter (Addendum)
Oncology Nurse Navigator Documentation  Received call back from [place], [facility] Urology.  I shared Rachael Greer's upcoming treatment plan, including first cycle of rituximab scheduled for 11/29.  I relayed Dr. Grier Mitts wish for Ms. Burrough to be closely monitored by Dr. [name] during treatment to address issues with interstitial cystitis.  She requested fax to [phone] to her attention with preferred frequency.  She provided [phone] for Dr. Irene Limbo to contact Dr. [name].  Dr. Irene Limbo informed of above.  [name], RN, BSN Head & Neck Oncology Nurse [place] at [place] [phone]

## 2018-05-03 ENCOUNTER — Telehealth: Payer: Self-pay | Admitting: *Deleted

## 2018-05-03 NOTE — Telephone Encounter (Signed)
Oncology Nurse Navigator Documentation  Received call-back from Rachael Greer.  I informed her of my conversation with Dr. [name] office.  She voiced understanding.  [name], RN, BSN Head & Neck Oncology Nurse [place] at [place] [phone]

## 2018-05-03 NOTE — Progress Notes (Signed)
Oncology Nurse Navigator Documentation  Met with Rachael Greer during Est. Pt. appt with Dr. Irene Limbo.  She was accompanied by her friend [place]. She voiced understanding of:  Systemic tmt options, agreed to initial plan for Retuxan to be followed by PET s/p 3rd cycle to gauge efficacy.  Initial cycle of Retuxan to be scheduled next week.  Tmt not currative.  The need to be closely monitored by urologist Dr. Alona Bene during tmts to address potential flare ups of interstitial cystitis.  Understands I will reach out to his office to coordinate follow-up. I encouraged her to call me with needs/concerns.  [name], RN, BSN Head & Neck Oncology Nurse [place] at [place] [phone]

## 2018-05-06 ENCOUNTER — Encounter: Payer: Self-pay | Admitting: Hematology

## 2018-05-06 ENCOUNTER — Other Ambulatory Visit: Payer: Self-pay | Admitting: Hematology

## 2018-05-06 DIAGNOSIS — C8238 Follicular lymphoma grade IIIa, lymph nodes of multiple sites: Secondary | ICD-10-CM

## 2018-05-06 DIAGNOSIS — Z7189 Other specified counseling: Secondary | ICD-10-CM | POA: Insufficient documentation

## 2018-05-06 DIAGNOSIS — C8230 Follicular lymphoma grade IIIa, unspecified site: Secondary | ICD-10-CM

## 2018-05-06 NOTE — Progress Notes (Signed)
START OFF PATHWAY REGIMEN - Lymphoma and CLL   [id number]:Bendamustine + Rituximab (90/375) q28 Days:   A cycle is every 28 days:     Bendamustine      Rituximab   **Always confirm dose/schedule in your pharmacy ordering system**  Patient Characteristics: Follicular Lymphoma, Grades 1, 2, and 3A, First Line, Stage I / II Disease Type: Follicular Lymphoma Disease Type: Not Applicable Disease Type: Not Applicable [place] Stage: IIE Tumor Grade: 2 Line of Therapy: First Line Intent of Therapy: Non-Curative / Palliative Intent, Discussed with Patient

## 2018-05-08 ENCOUNTER — Telehealth: Payer: Self-pay | Admitting: *Deleted

## 2018-05-08 ENCOUNTER — Ambulatory Visit: Payer: Medicare Other | Admitting: Hematology

## 2018-05-08 NOTE — Telephone Encounter (Signed)
FYI "Rachael Greer calling to speak with education nurse.  I have a question to ask about the Rituxan.  can this be used for people with Interstitial Cystitis (IC).  Is there any ingredient that can trigger an IC flare up?   Very concerned.  Ion a special diet; can only drink milk or water."  [facility] infusion nurses monitor closely for possiblle infusion reactions.  Report any symptoms or changes to your nurse during Rituxan antibody therapy.  Drink 64 oz water daily.

## 2018-05-10 ENCOUNTER — Inpatient Hospital Stay: Payer: Medicare Other

## 2018-05-10 ENCOUNTER — Other Ambulatory Visit: Payer: Self-pay | Admitting: *Deleted

## 2018-05-10 VITALS — BP 121/67 | HR 81 | Temp 97.5°F | Resp 16

## 2018-05-10 DIAGNOSIS — C8238 Follicular lymphoma grade IIIa, lymph nodes of multiple sites: Secondary | ICD-10-CM

## 2018-05-10 DIAGNOSIS — Z7189 Other specified counseling: Secondary | ICD-10-CM

## 2018-05-10 DIAGNOSIS — R0602 Shortness of breath: Secondary | ICD-10-CM | POA: Diagnosis not present

## 2018-05-10 DIAGNOSIS — N3010 Interstitial cystitis (chronic) without hematuria: Secondary | ICD-10-CM | POA: Diagnosis not present

## 2018-05-10 DIAGNOSIS — I10 Essential (primary) hypertension: Secondary | ICD-10-CM | POA: Diagnosis not present

## 2018-05-10 DIAGNOSIS — Z9012 Acquired absence of left breast and nipple: Secondary | ICD-10-CM | POA: Diagnosis not present

## 2018-05-10 DIAGNOSIS — C8231 Follicular lymphoma grade IIIa, lymph nodes of head, face, and neck: Secondary | ICD-10-CM | POA: Diagnosis not present

## 2018-05-10 DIAGNOSIS — Z853 Personal history of malignant neoplasm of breast: Secondary | ICD-10-CM | POA: Diagnosis not present

## 2018-05-10 DIAGNOSIS — I2510 Atherosclerotic heart disease of native coronary artery without angina pectoris: Secondary | ICD-10-CM | POA: Diagnosis not present

## 2018-05-10 DIAGNOSIS — Z791 Long term (current) use of non-steroidal anti-inflammatories (NSAID): Secondary | ICD-10-CM | POA: Diagnosis not present

## 2018-05-10 DIAGNOSIS — E785 Hyperlipidemia, unspecified: Secondary | ICD-10-CM | POA: Diagnosis not present

## 2018-05-10 DIAGNOSIS — Z79899 Other long term (current) drug therapy: Secondary | ICD-10-CM | POA: Diagnosis not present

## 2018-05-10 DIAGNOSIS — C8298 Follicular lymphoma, unspecified, lymph nodes of multiple sites: Secondary | ICD-10-CM

## 2018-05-10 DIAGNOSIS — R61 Generalized hyperhidrosis: Secondary | ICD-10-CM | POA: Diagnosis not present

## 2018-05-10 DIAGNOSIS — K760 Fatty (change of) liver, not elsewhere classified: Secondary | ICD-10-CM | POA: Diagnosis not present

## 2018-05-10 DIAGNOSIS — R7303 Prediabetes: Secondary | ICD-10-CM | POA: Diagnosis not present

## 2018-05-10 DIAGNOSIS — Z5112 Encounter for antineoplastic immunotherapy: Secondary | ICD-10-CM | POA: Diagnosis not present

## 2018-05-10 DIAGNOSIS — H9190 Unspecified hearing loss, unspecified ear: Secondary | ICD-10-CM | POA: Diagnosis not present

## 2018-05-10 DIAGNOSIS — M797 Fibromyalgia: Secondary | ICD-10-CM | POA: Diagnosis not present

## 2018-05-10 DIAGNOSIS — R42 Dizziness and giddiness: Secondary | ICD-10-CM | POA: Diagnosis not present

## 2018-05-10 LAB — CBC WITH DIFFERENTIAL/PLATELET
Abs Immature Granulocytes: 0.01 10*3/uL (ref 0.00–0.07)
Basophils Absolute: 0.1 10*3/uL (ref 0.0–0.1)
Basophils Relative: 1 %
Eosinophils Absolute: 0.1 10*3/uL (ref 0.0–0.5)
Eosinophils Relative: 2 %
HCT: 43.9 % (ref 36.0–46.0)
HEMOGLOBIN: 14.8 g/dL (ref 12.0–15.0)
Immature Granulocytes: 0 %
Lymphocytes Relative: 25 %
Lymphs Abs: 1.3 10*3/uL (ref 0.7–4.0)
MCH: 28.8 pg (ref 26.0–34.0)
MCHC: 33.7 g/dL (ref 30.0–36.0)
MCV: 85.6 fL (ref 80.0–100.0)
MONOS PCT: 10 %
Monocytes Absolute: 0.5 10*3/uL (ref 0.1–1.0)
Neutro Abs: 3.1 10*3/uL (ref 1.7–7.7)
Neutrophils Relative %: 62 %
Platelets: 90 10*3/uL — ABNORMAL LOW (ref 150–400)
RBC: 5.13 MIL/uL — ABNORMAL HIGH (ref 3.87–5.11)
RDW: 12.5 % (ref 11.5–15.5)
WBC: 5 10*3/uL (ref 4.0–10.5)
nRBC: 0 % (ref 0.0–0.2)

## 2018-05-10 LAB — URINALYSIS, COMPLETE (UACMP) WITH MICROSCOPIC
Bilirubin Urine: NEGATIVE
Glucose, UA: NEGATIVE mg/dL
Hgb urine dipstick: NEGATIVE
Ketones, ur: NEGATIVE mg/dL
Leukocytes, UA: NEGATIVE
Nitrite: NEGATIVE
PROTEIN: NEGATIVE mg/dL
Specific Gravity, Urine: 1.017 (ref 1.005–1.030)
pH: 5 (ref 5.0–8.0)

## 2018-05-10 LAB — CMP (CANCER CENTER ONLY)
ALBUMIN: 3.9 g/dL (ref 3.5–5.0)
ALK PHOS: 89 U/L (ref 38–126)
ALT: 30 U/L (ref 0–44)
AST: 22 U/L (ref 15–41)
Anion gap: 7 (ref 5–15)
BUN: 16 mg/dL (ref 8–23)
CO2: 22 mmol/L (ref 22–32)
Calcium: 9.8 mg/dL (ref 8.9–10.3)
Chloride: 111 mmol/L (ref 98–111)
Creatinine: 0.83 mg/dL (ref 0.44–1.00)
GFR, Est AFR Am: >60 mL/min (ref >60)
GFR, Estimated: >60 mL/min (ref >60)
Glucose, Bld: 91 mg/dL (ref 70–99)
Potassium: 4.1 mmol/L (ref 3.5–5.1)
Sodium: 140 mmol/L (ref 135–145)
Total Bilirubin: 1.1 mg/dL (ref 0.3–1.2)
Total Protein: 6.8 g/dL (ref 6.5–8.1)

## 2018-05-10 LAB — LACTATE DEHYDROGENASE: LDH: 204 U/L — ABNORMAL HIGH (ref 98–192)

## 2018-05-10 MED ORDER — SODIUM CHLORIDE 0.9 % IV SOLN
Freq: Once | INTRAVENOUS | Status: AC
  Administered 2018-05-10: 12:00:00 via INTRAVENOUS
  Filled 2018-05-10: qty 250

## 2018-05-10 MED ORDER — DEXAMETHASONE SODIUM PHOSPHATE 10 MG/ML IJ SOLN
10.0000 mg | Freq: Once | INTRAMUSCULAR | Status: AC
  Administered 2018-05-10: 10 mg via INTRAVENOUS

## 2018-05-10 MED ORDER — ACETAMINOPHEN 325 MG PO TABS
650.0000 mg | ORAL_TABLET | Freq: Once | ORAL | Status: AC
  Administered 2018-05-10: 650 mg via ORAL

## 2018-05-10 MED ORDER — DIPHENHYDRAMINE HCL 25 MG PO CAPS
50.0000 mg | ORAL_CAPSULE | Freq: Once | ORAL | Status: AC
  Administered 2018-05-10: 50 mg via ORAL

## 2018-05-10 MED ORDER — ONDANSETRON HCL 8 MG PO TABS: 8.0000 mg | ORAL_TABLET | Freq: Two times a day (BID) | ORAL | 1 refills | Status: DC | PRN

## 2018-05-10 MED ORDER — DIPHENHYDRAMINE HCL 25 MG PO CAPS
ORAL_CAPSULE | ORAL | Status: AC
  Filled 2018-05-10: qty 2

## 2018-05-10 MED ORDER — SODIUM CHLORIDE 0.9 % IV SOLN
375.0000 mg/m2 | Freq: Once | INTRAVENOUS | Status: AC
  Administered 2018-05-10: 700 mg via INTRAVENOUS
  Filled 2018-05-10: qty 70

## 2018-05-10 MED ORDER — DEXAMETHASONE SODIUM PHOSPHATE 10 MG/ML IJ SOLN
INTRAMUSCULAR | Status: AC
  Filled 2018-05-10: qty 1

## 2018-05-10 MED ORDER — ACETAMINOPHEN 325 MG PO TABS
ORAL_TABLET | ORAL | Status: AC
  Filled 2018-05-10: qty 2

## 2018-05-10 NOTE — Progress Notes (Signed)
Per Dr. Irene Limbo ok to treat w/ plt 90

## 2018-05-10 NOTE — Patient Instructions (Signed)
[facility] Discharge Instructions for Patients Receiving Chemotherapy  Today you received the following chemotherapy agents Rituxan  To help prevent nausea and vomiting after your treatment, we encourage you to take your nausea medication as prescribed   If you develop nausea and vomiting that is not controlled by your nausea medication, call the clinic.   BELOW ARE SYMPTOMS THAT SHOULD BE REPORTED IMMEDIATELY:  *FEVER GREATER THAN 100.5 F  *CHILLS WITH OR WITHOUT FEVER  NAUSEA AND VOMITING THAT IS NOT CONTROLLED WITH YOUR NAUSEA MEDICATION  *UNUSUAL SHORTNESS OF BREATH  *UNUSUAL BRUISING OR BLEEDING  TENDERNESS IN MOUTH AND THROAT WITH OR WITHOUT PRESENCE OF ULCERS  *URINARY PROBLEMS  *BOWEL PROBLEMS  UNUSUAL RASH Items with * indicate a potential emergency and should be followed up as soon as possible.  Feel free to call the clinic should you have any questions or concerns. The clinic phone number is [phone].  Please show the [facility] at check-in to the Emergency Department and triage nurse.  Rituximab; Hyaluronidase injection (Rituxan) What is this medicine? RITUXIMAB; HYALURONIDASE (ri TUX i mab / hye al ur ON i dase) is used to treat non-Hodgkin lymphoma and chronic lymphocytic leukemia. Rituximab is a monoclonal antibody. Hyaluronidase is used to improve the effects of rituximab. This medicine may be used for other purposes; ask your health care provider or pharmacist if you have questions. COMMON BRAND NAME(S): Rituxan Hycela What should I tell my health care provider before I take this medicine? They need to know if you have any of these conditions: -heart disease -infection (especially a virus infection such as hepatitis B, chickenpox, cold sores, or herpes) -immune system problems -irregular heartbeat -kidney disease -lung or breathing disease, like asthma -recently received or scheduled to receive a vaccine -an unusual or  allergic reaction to rituximab, rituximab/hyaluronidase, mouse proteins, other medicines, foods, dyes, or preservatives -pregnant or trying to get pregnant -breast-feeding How should I use this medicine? This medicine is for injection under the skin. It is given by a health care professional in a hospital or clinic setting. A special MedGuide will be given to you before each treatment. Be sure to read this information carefully each time. Talk to your pediatrician regarding the use of this medicine in children. Special care may be needed. Overdosage: If you think you have taken too much of this medicine contact a poison control center or emergency room at once. NOTE: This medicine is only for you. Do not share this medicine with others. What if I miss a dose? It is important not to miss your dose. Call your doctor or health care professional if you are unable to keep an appointment. What may interact with this medicine? This medicine may interact with the following medications: -cisplatin -live virus vaccines This list may not describe all possible interactions. Give your health care provider a list of all the medicines, herbs, non-prescription drugs, or dietary supplements you use. Also tell them if you smoke, drink alcohol, or use illegal drugs. Some items may interact with your medicine. What should I watch for while using this medicine? Your condition will be monitored carefully while you are receiving this medicine. You may need blood work done while you are taking this medicine. This medicine can cause serious allergic reactions. To reduce your risk you may need to take medicine before treatment with this medicine. Take your medicine as directed. In some patients, this medicine may cause a serious brain infection that may cause death.  If you have any problems seeing, thinking, speaking, walking, or standing, tell your doctor right away. If you cannot reach your doctor, urgently seek other  source of medical care. Call your doctor or health care professional for advice if you get a fever, chills or sore throat, or other symptoms of a cold or flu. Do not treat yourself. This drug decreases your body's ability to fight infections. Try to avoid being around people who are sick. Do not become pregnant while taking this medicine or for 12 months after stopping it. Women should inform their doctor if they wish to become pregnant or think they might be pregnant. There is a potential for serious side effects to an unborn child. Talk to your health care professional or pharmacist for more information. Do not breast-feed an infant while taking this medicine or for at least 6 months after stopping it. What side effects may I notice from receiving this medicine? Side effects that you should report to your doctor or health care professional as soon as possible: -breathing problems -chest pain -dizziness or feeling faint -fast, irregular heartbeat -low blood counts - this medicine may decrease the number of white blood cells, red blood cells and platelets. You may be at increased risk for infections and bleeding. -mouth sores -redness, blistering, peeling or loosening of the skin, including inside the mouth (this can be added for any serious or exfoliative rash that could lead to hospitalization) -signs of infection - fever or chills, cough, sore throat, pain or difficulty passing urine -signs and symptoms of kidney injury like trouble passing urine or change in the amount of urine -signs and symptoms of liver injury like dark yellow or brown urine; general ill feeling or flu-like symptoms; light-colored stools; loss of appetite; nausea; right upper belly pain; unusually weak or tired; yellowing of the eyes or skin -stomach pain -vomiting Side effects that usually do not require medical attention (report these to your doctor or health care professional if they continue or are  bothersome): -constipation -hair loss -headache -muscle cramps or muscle pain -pain, redness, or irritation at site where injected This list may not describe all possible side effects. Call your doctor for medical advice about side effects. You may report side effects to FDA at 1-800-FDA-1088. Where should I keep my medicine? This drug is given in a hospital or clinic and will not be stored at home. NOTE: This sheet is a summary. It may not cover all possible information. If you have questions about this medicine, talk to your doctor, pharmacist, or health care provider.  2018 Elsevier/Gold Standard (2016-01-05 15:36:38)

## 2018-05-11 ENCOUNTER — Telehealth: Payer: Self-pay | Admitting: *Deleted

## 2018-05-11 NOTE — Telephone Encounter (Signed)
Oncology Nurse Navigator Documentation  Returned Ms. Giuliani's VMM.  She indicated she rec'd VMM a short while ago with no phone number for [facility].  I noted the call did not come from Infusion per my earlier verification, could have been appt reminder for 12/3 appt with Dr. Sherryll Burger.  She indicated she will give his office a call.  She reported doing well s/p yesterday's initial infusion.  She acknowledged 12/11 11:00 lab/11:40 follow-up with Dr. Irene Limbo, understanding next infusion to be scheduled for late December.  [name], RN, BSN Head & Neck Oncology Nurse [facility] [phone]

## 2018-05-14 ENCOUNTER — Ambulatory Visit: Payer: Medicare Other | Admitting: Cardiology

## 2018-05-14 NOTE — Progress Notes (Signed)
HPI: FU palpitations. Holter monitor April 2015 showed sinus rhythm with PACs, PVCs and brief PAT. Stress echocardiogram November 2015 was normal.   Echocardiogram January 2018 showed normal LV function and trace mitral regurgitation.  Had recent PET scan for lymphoma and atherosclerosis noted in aorta and coronaries and pt present to discuss this further. 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  She does not have significant dyspnea on exertion or chest pain.  No palpitations or syncope.  Current Outpatient Medications  Medication Sig Dispense Refill  . amLODipine (NORVASC) 2.5 MG tablet Take 2.5 mg by mouth daily.    Marland Kitchen atorvastatin (LIPITOR) 20 MG tablet Take 20 mg by mouth once a week.     . clonazePAM (KLONOPIN) 1 MG tablet Take 1 mg by mouth 2 (two) times daily as needed for anxiety.     . Ibuprofen-Diphenhydramine HCl (ADVIL PM) 200-25 MG CAPS Take 0.5-1 tablets by mouth daily as needed (sleep).     . metoprolol succinate (TOPROL-XL) 25 MG 24 hr tablet Take 25 mg by mouth every evening.    Marland Kitchen omeprazole (PRILOSEC) 40 MG capsule Take 40 mg by mouth daily.    . ondansetron (ZOFRAN) 8 MG tablet Take 1 tablet (8 mg total) by mouth 2 (two) times daily as needed for nausea or vomiting. 40 tablet 1  . pentosan polysulfate (ELMIRON) 100 MG capsule Take 100 mg by mouth 3 (three) times daily.     Vladimir Faster Glycol-Propyl Glycol (SYSTANE OP) Apply 1 drop to eye 2 (two) times daily.     No current facility-administered medications for this visit.      Past Medical History:  Diagnosis Date  . Anxiety   . Breast cancer ([place])   . Chest pain   . Depression   . DOE (dyspnea on exertion)   . Eustachian tube obstruction, bilateral   . Fibromyalgia   . Gastric reflux   . History of kidney stones   . Hyperlipidemia   . Hypertension   . IBS (irritable bowel syndrome)   . Interstitial cystitis   . Nephrolithiasis   .  Nephrolithiasis   . Palpitations   . Pre-diabetes   . Sepsis [facility])     Past Surgical History:  Procedure Laterality Date  . BREAST DUCTAL SYSTEM EXCISION Right 10/30/2016   Procedure: RIGHT BREAST DUCT EXCISION;  Surgeon: Rolm Bookbinder, MD;  Location: [facility];  Service: General;  Laterality: Right;  . EYE SURGERY     bilateral lense placements  . MASTECTOMY Left   . NECK SURGERY     muscles cut in her neck  . NEPHROSTOMY    . TONSILLECTOMY      Social History   Socioeconomic History  . Marital status: Widowed    Spouse name: Not on file  . Number of children: 4  . Years of education: Not on file  . Highest education level: Not on file  Occupational History  . Not on file  Social Needs  . Financial resource strain: Not on file  . Food insecurity:    Worry: Not on file    Inability: Not on file  . Transportation needs:    Medical: Not on file    Non-medical: Not on file  Tobacco Use  . Smoking status: Never Smoker  . Smokeless tobacco: Never Used  Substance and Sexual Activity  . Alcohol use: No  . Drug use: No  .  Sexual activity: Not on file  Lifestyle  . Physical activity:    Days per week: Not on file    Minutes per session: Not on file  . Stress: Not on file  Relationships  . Social connections:    Talks on phone: Not on file    Gets together: Not on file    Attends religious service: Not on file    Active member of club or organization: Not on file    Attends meetings of clubs or organizations: Not on file    Relationship status: Not on file  . Intimate partner violence:    Fear of current or ex partner: Not on file    Emotionally abused: Not on file    Physically abused: Not on file    Forced sexual activity: Not on file  Other Topics Concern  . Not on file  Social History Narrative  . Not on file    Family History  Problem Relation Age of Onset  . CAD Father   . Heart attack Father   . Dementia Mother     ROS: Patient describes fatigue  but no fevers or chills, productive cough, hemoptysis, dysphasia, odynophagia, melena, hematochezia, dysuria, hematuria, rash, seizure activity, orthopnea, PND, pedal edema, claudication. Remaining systems are negative.  Physical Exam: Well-developed well-nourished in no acute distress.  Skin is warm and dry.  HEENT is normal.  Neck is supple.  There is adenopathy in the right cervical area. Chest is clear to auscultation with normal expansion.  Cardiovascular exam is regular rate and rhythm.  Abdominal exam nontender or distended. No masses palpated. Extremities show no edema. neuro grossly intact  ECG-sinus rhythm with no ST changes.  March 06, 2018 personally reviewed  A/P  1 coronary calcification-plan to continue with medical therapy.  Continue statin.  Add aspirin 81 mg daily.  She is not having chest pain.  Previous functional study negative.  She is being actively treated for lymphoma.  We will consider repeating her stress test in the future as her lymphoma improves.  2 palpitations-plan to continue present dose of beta-blocker.  Symptoms are well controlled.  3 hypertension-blood pressure is controlled.  Continue present medications and follow.  4 hyperlipidemia-continue statin.  Given atherosclerosis noted in coronaries we will increase Lipitor to 20 mg daily.  Check lipids and liver in 4 weeks.  5 recently diagnosed lymphoma-per oncology.  Kirk Ruths, MD

## 2018-05-15 ENCOUNTER — Ambulatory Visit (INDEPENDENT_AMBULATORY_CARE_PROVIDER_SITE_OTHER): Payer: Medicare Other | Admitting: Cardiology

## 2018-05-15 ENCOUNTER — Encounter: Payer: Self-pay | Admitting: Cardiology

## 2018-05-15 VITALS — BP 118/66 | HR 70 | Ht 63.0 in | Wt 159.8 lb

## 2018-05-15 DIAGNOSIS — I2510 Atherosclerotic heart disease of native coronary artery without angina pectoris: Secondary | ICD-10-CM

## 2018-05-15 DIAGNOSIS — E7800 Pure hypercholesterolemia, unspecified: Secondary | ICD-10-CM | POA: Diagnosis not present

## 2018-05-15 DIAGNOSIS — I10 Essential (primary) hypertension: Secondary | ICD-10-CM

## 2018-05-15 MED ORDER — ASPIRIN EC 81 MG PO TBEC: 81.0000 mg | DELAYED_RELEASE_TABLET | Freq: Every day | ORAL | 3 refills | Status: AC

## 2018-05-15 MED ORDER — ATORVASTATIN CALCIUM 20 MG PO TABS: 20.0000 mg | ORAL_TABLET | Freq: Every day | ORAL | 3 refills | Status: DC

## 2018-05-15 NOTE — Patient Instructions (Signed)
Medication Instructions:   START ASPIRIN 81 MG ONCE DAILY  INCREASE ATORVASTATIN TO 20 MG EVERY DAY  Labwork:  Your physician recommends that you return for lab work in: [place]:  Your physician recommends that you schedule a follow-up appointment in: [place] 2 MONTHS PRIOR TO THAT APPOINTMENT TIME TO SCHEDULE

## 2018-05-22 ENCOUNTER — Inpatient Hospital Stay (HOSPITAL_BASED_OUTPATIENT_CLINIC_OR_DEPARTMENT_OTHER): Payer: Medicare Other | Admitting: Hematology

## 2018-05-22 ENCOUNTER — Telehealth: Payer: Self-pay

## 2018-05-22 ENCOUNTER — Inpatient Hospital Stay: Payer: Medicare Other | Attending: Hematology

## 2018-05-22 ENCOUNTER — Encounter: Payer: Self-pay | Admitting: *Deleted

## 2018-05-22 ENCOUNTER — Other Ambulatory Visit: Payer: Self-pay | Admitting: Hematology

## 2018-05-22 VITALS — BP 118/61 | HR 58 | Temp 97.9°F | Resp 18 | Ht 63.0 in | Wt 159.0 lb

## 2018-05-22 DIAGNOSIS — K219 Gastro-esophageal reflux disease without esophagitis: Secondary | ICD-10-CM

## 2018-05-22 DIAGNOSIS — R0602 Shortness of breath: Secondary | ICD-10-CM | POA: Insufficient documentation

## 2018-05-22 DIAGNOSIS — I10 Essential (primary) hypertension: Secondary | ICD-10-CM | POA: Diagnosis not present

## 2018-05-22 DIAGNOSIS — N200 Calculus of kidney: Secondary | ICD-10-CM

## 2018-05-22 DIAGNOSIS — R42 Dizziness and giddiness: Secondary | ICD-10-CM | POA: Diagnosis not present

## 2018-05-22 DIAGNOSIS — Z7982 Long term (current) use of aspirin: Secondary | ICD-10-CM

## 2018-05-22 DIAGNOSIS — I2510 Atherosclerotic heart disease of native coronary artery without angina pectoris: Secondary | ICD-10-CM

## 2018-05-22 DIAGNOSIS — Z5111 Encounter for antineoplastic chemotherapy: Secondary | ICD-10-CM | POA: Diagnosis not present

## 2018-05-22 DIAGNOSIS — C8238 Follicular lymphoma grade IIIa, lymph nodes of multiple sites: Secondary | ICD-10-CM

## 2018-05-22 DIAGNOSIS — R200 Anesthesia of skin: Secondary | ICD-10-CM | POA: Diagnosis not present

## 2018-05-22 DIAGNOSIS — C8231 Follicular lymphoma grade IIIa, lymph nodes of head, face, and neck: Secondary | ICD-10-CM | POA: Diagnosis not present

## 2018-05-22 DIAGNOSIS — Z791 Long term (current) use of non-steroidal anti-inflammatories (NSAID): Secondary | ICD-10-CM | POA: Diagnosis not present

## 2018-05-22 DIAGNOSIS — E785 Hyperlipidemia, unspecified: Secondary | ICD-10-CM | POA: Insufficient documentation

## 2018-05-22 DIAGNOSIS — H9190 Unspecified hearing loss, unspecified ear: Secondary | ICD-10-CM | POA: Diagnosis not present

## 2018-05-22 DIAGNOSIS — Z5112 Encounter for antineoplastic immunotherapy: Secondary | ICD-10-CM | POA: Insufficient documentation

## 2018-05-22 DIAGNOSIS — H9201 Otalgia, right ear: Secondary | ICD-10-CM | POA: Diagnosis not present

## 2018-05-22 DIAGNOSIS — Z79899 Other long term (current) drug therapy: Secondary | ICD-10-CM | POA: Diagnosis not present

## 2018-05-22 DIAGNOSIS — R61 Generalized hyperhidrosis: Secondary | ICD-10-CM | POA: Diagnosis not present

## 2018-05-22 DIAGNOSIS — N3010 Interstitial cystitis (chronic) without hematuria: Secondary | ICD-10-CM

## 2018-05-22 DIAGNOSIS — K760 Fatty (change of) liver, not elsewhere classified: Secondary | ICD-10-CM | POA: Insufficient documentation

## 2018-05-22 DIAGNOSIS — Z9223 Personal history of estrogen therapy: Secondary | ICD-10-CM

## 2018-05-22 DIAGNOSIS — Z9012 Acquired absence of left breast and nipple: Secondary | ICD-10-CM | POA: Insufficient documentation

## 2018-05-22 DIAGNOSIS — M797 Fibromyalgia: Secondary | ICD-10-CM | POA: Insufficient documentation

## 2018-05-22 DIAGNOSIS — Z853 Personal history of malignant neoplasm of breast: Secondary | ICD-10-CM

## 2018-05-22 DIAGNOSIS — R7303 Prediabetes: Secondary | ICD-10-CM | POA: Insufficient documentation

## 2018-05-22 DIAGNOSIS — F419 Anxiety disorder, unspecified: Secondary | ICD-10-CM | POA: Insufficient documentation

## 2018-05-22 LAB — CBC WITH DIFFERENTIAL/PLATELET
Abs Immature Granulocytes: 0.01 10*3/uL (ref 0.00–0.07)
Basophils Absolute: 0.1 10*3/uL (ref 0.0–0.1)
Basophils Relative: 1 %
Eosinophils Absolute: 0.1 10*3/uL (ref 0.0–0.5)
Eosinophils Relative: 2 %
HCT: 43.9 % (ref 36.0–46.0)
Hemoglobin: 14.6 g/dL (ref 12.0–15.0)
Immature Granulocytes: 0 %
Lymphocytes Relative: 24 %
Lymphs Abs: 1.5 10*3/uL (ref 0.7–4.0)
MCH: 28.7 pg (ref 26.0–34.0)
MCHC: 33.3 g/dL (ref 30.0–36.0)
MCV: 86.2 fL (ref 80.0–100.0)
Monocytes Absolute: 0.6 10*3/uL (ref 0.1–1.0)
Monocytes Relative: 10 %
Neutro Abs: 3.8 10*3/uL (ref 1.7–7.7)
Neutrophils Relative %: 63 %
Platelets: 200 10*3/uL (ref 150–400)
RBC: 5.09 MIL/uL (ref 3.87–5.11)
RDW: 12.7 % (ref 11.5–15.5)
WBC: 6.1 10*3/uL (ref 4.0–10.5)
nRBC: 0 % (ref 0.0–0.2)

## 2018-05-22 LAB — CMP (CANCER CENTER ONLY)
ALT: 28 U/L (ref 0–44)
AST: 19 U/L (ref 15–41)
Albumin: 4 g/dL (ref 3.5–5.0)
Alkaline Phosphatase: 79 U/L (ref 38–126)
Anion gap: 9 (ref 5–15)
BUN: 20 mg/dL (ref 8–23)
CO2: 26 mmol/L (ref 22–32)
Calcium: 10.5 mg/dL — ABNORMAL HIGH (ref 8.9–10.3)
Chloride: 107 mmol/L (ref 98–111)
Creatinine: 0.91 mg/dL (ref 0.44–1.00)
GFR, Est AFR Am: >60 mL/min (ref >60)
GFR, Estimated: >60 mL/min (ref >60)
Glucose, Bld: 78 mg/dL (ref 70–99)
Potassium: 4.2 mmol/L (ref 3.5–5.1)
Sodium: 142 mmol/L (ref 135–145)
TOTAL PROTEIN: 7.1 g/dL (ref 6.5–8.1)
Total Bilirubin: 1.1 mg/dL (ref 0.3–1.2)

## 2018-05-22 LAB — LACTATE DEHYDROGENASE: LDH: 181 U/L (ref 98–192)

## 2018-05-22 NOTE — Progress Notes (Signed)
HEMATOLOGY/ONCOLOGY CONSULTATION NOTE  Date of Service: 05/22/2018  Patient Care Team: [name], MD as PCP - General (Internal Medicine) Stanford Breed [name], MD as PCP - Cardiology (Cardiology) [name], MD as Consulting Physician (Hematology) [name], RN as Oncology Nurse Navigator (Oncology)  CHIEF COMPLAINTS/PURPOSE OF CONSULTATION:  Follicular lymphoma grade 3a  HISTORY OF PRESENTING ILLNESS:   Rachael Greer is a wonderful 77 y.o. female who has been referred to [name] by Dr. 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 She is accompanied today by a friend, and our Nurse Navigator [name]. The pt reports that she is doing well overall.   The pt reports that things began changing last September in 2018. She notes that she has taken Flonase, Claritin, and saline solution and did not have a runny nose, and did not have any breathing difficulty. She notes that her head felt as though she had a flu or a cold, without any other symptoms. She notes that she felt as if her ears were swollen and has had some minimal hearing impairment, and has myringotomy tubes. She began a steroid course about 4 months ago with Dr. [name]. She notes that she first noticed right sided neck swelling in the last month, and left sided neck swelling which began 2-3 weeks ago.   She obtained a CT Neck on 03/08/18, as noted below.   The pt notes that she has not had fevers or chills, but has woken up recently with night sweats occasionally over the last 6 months. She adds that she has noticed some SOB in the last 7-8 months which presents intermittently, and has not progressed. The pt notes that she has had some difficulty swallowing in the past, and saw Dr. [name] for concerns of a stricture, and adds that this she has had recent difficulty swallowing. The pt also notes that she has recently felt dizziness, described as the room spinning. She has not had her dizziness worked  up.  The pt notes that she had a mastectomy without radiation or chemotherapy for breast cancer in situ in 2013. She began tamoxifen preventatively afterwards for 2.5 years.   The pt notes that her interstitial cystitis cause is unknown. She notes that she took a TB vaccine 5-7 years ago, and developed severe pain and sepsis. She notes that she has not had recurrent UTIs and takes Elmiron under the care of Dr. [name] in urology. She notes that there are certain foods that she cannot eat without developing bladder pain. She also denies blood in the urine.  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 2. RIGHT lymphadenitis seen with superimposed infection or pericapsular spread of tumor.  Most recent lab results (03/08/18) of CBC w/diff and BMP is as follows: all values are WNL except for RBC at 5.22, HGB at 15.3.  On review of systems, pt reports some night sweats, some SOB, right neck swelling, left neck swelling, dizziness, hearing impairment, healed incision, right ear numbness, and denies fevers, chills, runny nose, abdominal pains, abdominal distension, blood in the urine, sinus pressure or pain, pain along the spine, leg swelling, skin rashes, and any other symptoms.   On PMHx the pt reports breast cancer in 2013, sternocleidomastoid release in childhood, interstitial cystitis, sepsis. On Social Hx the pt denies ever  smoking cigarettes and denies ever consuming alcohol  Interval History:  Rachael Greer returns today for management and evaluation of her Grade 3a Follicular Lymphoma, and after her first infusion of Rituxan. The patient's last visit with [name] was on 04/30/18. She is accompanied today by her daughter and our Nurse Navigator [name]. The pt reports  that she is doing well overall.   The pt reports that she had no problems tolerating her first infusion of Rituxan and denies any new symptoms related to her interstitial cystitis. She notes that she will be seeing her Urologist Dr. [name] tomorrow for evaluation of her IC in the setting of intending to begin Bendamustine. The pt denies any new skin rashes.   The pt notes that her pervious sense of fullness in the back of her nose has improved significantly after her first infusion.   The pt notes that her outer right ear has continued to be quite sore and tender to the touch, and notes that some numbness has radiated into her cheek. She notes that this soreness began after her surgical biopsy.  Lab results today (05/22/18) of CBC w/diff and CMP is as follows: all values are WNL except for Calcium at 10.5. 05/22/18 LDH is normal at 181  On review of systems, pt reports good energy levels, right cheek numbness, right ear sensitivity, improved sense of nasal fullness, moving her bowels well, and denies bladder symptoms, skin rashes, diarrhea, fevers, chills, night sweats, leg swelling, abdominal pains, and any other symptoms.   MEDICAL HISTORY:  Past Medical History:  Diagnosis Date  . Anxiety   . Breast cancer ([place])   . Chest pain   . Depression   . DOE (dyspnea on exertion)   . Eustachian tube obstruction, bilateral   . Fibromyalgia   . Gastric reflux   . History of kidney stones   . Hyperlipidemia   . Hypertension   . IBS (irritable bowel syndrome)   . Interstitial cystitis   . Nephrolithiasis   . Nephrolithiasis   . Palpitations   . Pre-diabetes   . Sepsis ([place])     SURGICAL HISTORY: Past Surgical History:  Procedure Laterality Date  . BREAST DUCTAL SYSTEM EXCISION Right 10/30/2016   Procedure: RIGHT BREAST DUCT EXCISION;  Surgeon: Rolm Bookbinder, MD;  Location: [facility];  Service: General;  Laterality: Right;  . EYE SURGERY     bilateral lense placements  . MASTECTOMY  Left   . NECK SURGERY     muscles cut in her neck  . NEPHROSTOMY    . TONSILLECTOMY      SOCIAL HISTORY: Social History   Socioeconomic History  . Marital status: Widowed    Spouse name: Not on file  . Number of children: 4  . Years of education: Not on file  . Highest education level: Not on file  Occupational History  . Not on file  Social Needs  . Financial resource strain: Not on file  . Food insecurity:    Worry: Not on file    Inability: Not on file  . Transportation needs:    Medical: Not on file    Non-medical: Not on file  Tobacco Use  . Smoking status: Never Smoker  . Smokeless tobacco: Never Used  Substance and Sexual Activity  . Alcohol use: No  . Drug use: No  . Sexual activity: Not on file  Lifestyle  . Physical activity:    Days per week: Not on file    Minutes  per session: Not on file  . Stress: Not on file  Relationships  . Social connections:    Talks on phone: Not on file    Gets together: Not on file    Attends religious service: Not on file    Active member of club or organization: Not on file    Attends meetings of clubs or organizations: Not on file    Relationship status: Not on file  . Intimate partner violence:    Fear of current or ex partner: Not on file    Emotionally abused: Not on file    Physically abused: Not on file    Forced sexual activity: Not on file  Other Topics Concern  . Not on file  Social History Narrative  . Not on file    FAMILY HISTORY: Family History  Problem Relation Age of Onset  . CAD Father   . Heart attack Father   . Dementia Mother     ALLERGIES:  is allergic to amoxicillin and no known allergies.  MEDICATIONS:  Current Outpatient Medications  Medication Sig Dispense Refill  . amLODipine (NORVASC) 2.5 MG tablet Take 2.5 mg by mouth daily.    Marland Kitchen aspirin EC 81 MG tablet Take 1 tablet (81 mg total) by mouth daily. 90 tablet 3  . atorvastatin (LIPITOR) 20 MG tablet Take 1 tablet (20 mg total) by  mouth daily at 6 PM. 90 tablet 3  . clonazePAM (KLONOPIN) 1 MG tablet Take 1 mg by mouth 2 (two) times daily as needed for anxiety.     . Ibuprofen-Diphenhydramine HCl (ADVIL PM) 200-25 MG CAPS Take 0.5-1 tablets by mouth daily as needed (sleep).     . metoprolol succinate (TOPROL-XL) 25 MG 24 hr tablet Take 25 mg by mouth every evening.    Marland Kitchen omeprazole (PRILOSEC) 40 MG capsule Take 40 mg by mouth daily.    . ondansetron (ZOFRAN) 8 MG tablet Take 1 tablet (8 mg total) by mouth 2 (two) times daily as needed for nausea or vomiting. 40 tablet 1  . pentosan polysulfate (ELMIRON) 100 MG capsule Take 100 mg by mouth 3 (three) times daily.     Vladimir Faster Glycol-Propyl Glycol (SYSTANE OP) Apply 1 drop to eye 2 (two) times daily.     No current facility-administered medications for this visit.     REVIEW OF SYSTEMS:    A 10+ POINT REVIEW OF SYSTEMS WAS OBTAINED including neurology, dermatology, psychiatry, cardiac, respiratory, lymph, extremities, GI, GU, Musculoskeletal, constitutional, breasts, reproductive, HEENT.  All pertinent positives are noted in the HPI.  All others are negative.   PHYSICAL EXAMINATION: ECOG PERFORMANCE STATUS: 2 - Symptomatic, <50% confined to bed  . Vitals:   05/22/18 1148  BP: 118/61  Pulse: (!) 58  Resp: 18  Temp: 97.9 F (36.6 C)  SpO2: 99%   Filed Weights   05/22/18 1148  Weight: 159 lb (72.1 kg)   .Body mass index is 28.17 kg/m.  GENERAL:alert, in no acute distress and comfortable SKIN: no acute rashes, no significant lesions EYES: conjunctiva are pink and non-injected, sclera anicteric OROPHARYNX: MMM, no exudates, no oropharyngeal erythema or ulceration NECK: supple, no JVD LYMPH:  Palpable lymphadenopathy in the left cervical, 4-5cm palpable LN in right cervical, no palpable lymphadenopathy in the axillary or inguinal regions LUNGS: clear to auscultation b/l with normal respiratory effort HEART: regular rate & rhythm ABDOMEN:  normoactive bowel  sounds , non tender, not distended. No palpable hepatosplenomegaly.  Extremity: no pedal edema PSYCH:  alert & oriented x 3 with fluent speech NEURO: no focal motor/sensory deficits   LABORATORY DATA:  I have reviewed the data as listed  . CBC Latest Ref Rng & Units 05/22/2018 05/10/2018 04/04/2018  WBC 4.0 - 10.5 K/uL 6.1 5.0 8.2  Hemoglobin 12.0 - 15.0 g/dL 14.6 14.8 15.2(H)  Hematocrit 36.0 - 46.0 % 43.9 43.9 45.8  Platelets 150 - 400 K/uL 200 90(L) 194    . CMP Latest Ref Rng & Units 05/22/2018 05/10/2018 04/04/2018  Glucose 70 - 99 mg/dL 78 91 79  BUN 8 - 23 mg/dL 20 16 13   Creatinine 0.44 - 1.00 mg/dL 0.91 0.83 0.86  Sodium 135 - 145 mmol/L 142 140 143  Potassium 3.5 - 5.1 mmol/L 4.2 4.1 4.2  Chloride 98 - 111 mmol/L 107 111 107  CO2 22 - 32 mmol/L 26 22 26   Calcium 8.9 - 10.3 mg/dL 10.5(H) 9.8 10.5(H)  Total Protein 6.5 - 8.1 g/dL 7.1 6.8 7.4  Total Bilirubin 0.3 - 1.2 mg/dL 1.1 1.1 1.3(H)  Alkaline Phos 38 - 126 U/L 79 89 91  AST 15 - 41 U/L 19 22 29   ALT 0 - 44 U/L 28 30 38    03/22/18 Flow Cytometry:   03/22/18 Biopsy:     RADIOGRAPHIC STUDIES: I have personally reviewed the radiological images as listed and agreed with the findings in the report. No results found.  ASSESSMENT & PLAN:   78 y.o. female with  1. 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 2. RIGHT lymphadenitis seen with superimposed infection or pericapsular spread of tumor.    03/22/18 biopsy revealed follicular lymphoma, Grade 3a   04/04/18 Hep B and Hep C negative   04/17/18 PET/CT revealed Notable bilateral adenopathy in the neck and posterior nasopharynx is hypermetabolic and mostly De[place]ville 5 level activity, compatible with malignancy. 2. No pathologically enlarged or significantly  hypermetabolic adenopathy in the chest, or abdomen/pelvis. No splenomegaly. Slight enlargement of the photopenic splenic cyst noted on remote prior Exams. 3. Other imaging findings of potential clinical significance: Aortic Atherosclerosis. Coronary atherosclerosis. Diffuse hepatic steatosis. Nonobstructive left nephrolithiasis.   PLAN: -Discussed pt labwork today, 05/22/18; blood counts have normalized with PLT up to 200k from 90k, calcium slightly higher at 10.5. LDH normalized to 181.  -Suspect possible nerve compression in right ear and cheek that is expected to improve after treatment   -Intending treatment of either Bendamustine and Rituxan for 4-6 cycles vs Rituxan alone. Will not treat with R-CHOP given interstitial cystitis. -Follow up with Urology tomorrow -Pt tolerated first infusion of Rituxan very well, will plan for next infusion on 06/07/18 which will include second infusion of Rituxan, and tentatively first infusion of Bendamustine, pending Urological clearance tomorrow   -Will dose reduce Bendamustine to 70mg /m2 from standard 90mg /m2 -Need urology clearance for Bendamustine treatment, and prefer active management of interstitial cystitis during treatment from Dr. Alona Bene, given interstitial cystitis and presence of chemotherapy  -Discussed that the intent of treatment is to achieve remission, is not curative -Will see the pt back in 15 days, one day prior to intended dose adjusted BR treatment    RTC with Dr Irene Limbo with labs on 12/26 Please schedule C2 of BR chemotherapy on 12/27 and 12/28 as ordered   All of the patients questions were answered with apparent satisfaction. The patient knows to call the clinic with any problems,  questions or concerns.  The total time spent in the appt was 45 minutes and more than 50% was on counseling and direct patient cares.    Sullivan Lone MD MS AAHIVMS [facility] Hematology/Oncology Physician [facility]  (Office):        [phone] (Work cell):  [phone] (Fax):           [phone]  05/22/2018 12:44 PM  I, Baldwin [place], am acting as a scribe for Dr. Sullivan Lone.   .I have reviewed the above documentation for accuracy and completeness, and I agree with the above. [name] MD

## 2018-05-22 NOTE — Telephone Encounter (Signed)
Printer avs and calender of upcoming appointment. Per 12/11 los

## 2018-05-23 ENCOUNTER — Encounter: Payer: Self-pay | Admitting: *Deleted

## 2018-05-23 ENCOUNTER — Telehealth: Payer: Self-pay | Admitting: *Deleted

## 2018-05-23 DIAGNOSIS — C8511 Unspecified B-cell lymphoma, lymph nodes of head, face, and neck: Secondary | ICD-10-CM | POA: Diagnosis not present

## 2018-05-23 DIAGNOSIS — Z87442 Personal history of urinary calculi: Secondary | ICD-10-CM | POA: Diagnosis not present

## 2018-05-23 DIAGNOSIS — N3010 Interstitial cystitis (chronic) without hematuria: Secondary | ICD-10-CM | POA: Diagnosis not present

## 2018-05-23 DIAGNOSIS — R590 Localized enlarged lymph nodes: Secondary | ICD-10-CM | POA: Diagnosis not present

## 2018-05-23 DIAGNOSIS — M797 Fibromyalgia: Secondary | ICD-10-CM | POA: Diagnosis not present

## 2018-05-23 NOTE — Progress Notes (Signed)
Oncology Nurse Navigator Documentation  Met with Rachael Greer during Est. Pt. appt with Dr. Irene Limbo.  She was accompanied by friend Rachael Greer. She reported:  Doing well following first Retuxin infusion 11/29, denied SEs, issues.  Noticeable resolution of sinus fullness.  Has appt with urologist Dr. [name] tomorrow. Following extensive discussion, she agreed to move forward with addition of Bendamustine in conjunction with next Retuxin infusion., voiced understanding to be scheduled 12/26,27. She voiced understanding:  Combination of therapies provides best treatment option.  If Bendmustine proves to be problematic, it can be removed from treatment plan.  Imaging will be repeated s/p 3-4 cycles. She was encouraged to share the above with Dr. [name] tomorrow, request per Dr. Irene Limbo follow-up with him ca 2 weeks s/p 12/26,27 infusion since it will include first cycle of Bendamustine.  He can then establish preferred follow-up frequency. I encouraged her to call me tomorrow following appt with Dr. [name] to provide update.  She agreed.  [name], RN, BSN Head & Neck Oncology Nurse [place] [facility] [place] [phone]

## 2018-05-23 NOTE — Progress Notes (Signed)
A user error has taken place: encounter opened in error, closed for administrative reasons.

## 2018-05-23 NOTE — Telephone Encounter (Signed)
Oncology Nurse Navigator Documentation  Rec'd call from Ms. Kaneshiro s/p today's appt with urologist Dr. [name]. She indicated:  He supports her moving forward with chemotherapy, will monitor her progress, address bladder cystitis issues as needed.  Next appt is 07/05/18. I indicated in my notes from yesterday's appt,  Dr. Irene Limbo recommended she be seen by Dr. [name] 2 weeks s/p next infusion d/t initial inclusion of Bendamustine scheduled for 12/26,27.  I encouraged her to call Dr. [name]' office to request appt approx 06/24/18.  Dr. Irene Limbo informed.  [name], RN, BSN Head & Neck Oncology Nurse [place] at [place] [phone]

## 2018-06-03 NOTE — Progress Notes (Signed)
HEMATOLOGY/ONCOLOGY CONSULTATION NOTE  Date of Service: 06/04/2018  Patient Care Team: [name], MD as PCP - General (Internal Medicine) Stanford Breed [name], MD as PCP - Cardiology (Cardiology) [name], MD as Consulting Physician (Hematology) [name], RN as Oncology Nurse Navigator (Oncology)  CHIEF COMPLAINTS/PURPOSE OF CONSULTATION:  Follicular lymphoma grade 3a  HISTORY OF PRESENTING ILLNESS:   Rachael Greer is a wonderful 77 y.o. female who has been referred to [name] by Dr. 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 She is accompanied today by a friend, and our Nurse Navigator [name]. The pt reports that she is doing well overall.   The pt reports that things began changing last September in 2018. She notes that she has taken Flonase, Claritin, and saline solution and did not have a runny nose, and did not have any breathing difficulty. She notes that her head felt as though she had a flu or a cold, without any other symptoms. She notes that she felt as if her ears were swollen and has had some minimal hearing impairment, and has myringotomy tubes. She began a steroid course about 4 months ago with Dr. [name]. She notes that she first noticed right sided neck swelling in the last month, and left sided neck swelling which began 2-3 weeks ago.   She obtained a CT Neck on 03/08/18, as noted below.   The pt notes that she has not had fevers or chills, but has woken up recently with night sweats occasionally over the last 6 months. She adds that she has noticed some SOB in the last 7-8 months which presents intermittently, and has not progressed. The pt notes that she has had some difficulty swallowing in the past, and saw Dr. [name] for concerns of a stricture, and adds that this she has had recent difficulty swallowing. The pt also notes that she has recently felt dizziness, described as the room spinning. She has not had her dizziness worked  up.  The pt notes that she had a mastectomy without radiation or chemotherapy for breast cancer in situ in 2013. She began tamoxifen preventatively afterwards for 2.5 years.   The pt notes that her interstitial cystitis cause is unknown. She notes that she took a TB vaccine 5-7 years ago, and developed severe pain and sepsis. She notes that she has not had recurrent UTIs and takes Elmiron under the care of Dr. [name] in urology. She notes that there are certain foods that she cannot eat without developing bladder pain. She also denies blood in the urine.  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 2. RIGHT lymphadenitis seen with superimposed infection or pericapsular spread of tumor.  Most recent lab results (03/08/18) of CBC w/diff and BMP is as follows: all values are WNL except for RBC at 5.22, HGB at 15.3.  On review of systems, pt reports some night sweats, some SOB, right neck swelling, left neck swelling, dizziness, hearing impairment, healed incision, right ear numbness, and denies fevers, chills, runny nose, abdominal pains, abdominal distension, blood in the urine, sinus pressure or pain, pain along the spine, leg swelling, skin rashes, and any other symptoms.   On PMHx the pt reports breast cancer in 2013, sternocleidomastoid release in childhood, interstitial cystitis, sepsis. On Social Hx the pt denies ever  smoking cigarettes and denies ever consuming alcohol  Interval History:  Rachael Greer returns today for management and evaluation of her Grade 3a Follicular Lymphoma, and after her first infusion of Rituxan. The patient's last visit with [name] was on 05/22/18. She is accompanied today by a friend. The pt reports that she is doing well overall.   In  the interim, the pt was able to see Dr. Alona Bene in Urology for management and evaluation of her interstitial cystitis. She will continue Elmeron and will start Ranitidine. The pt denies any problems passing urine and denies difficulty moving her bowels.  The pt reports that she notices that her enlarged lymph node in the right side of her neck has improved and reduced in size. She notes that her nasal fullness is slightly improved as well.   The pt also notes that she feels "a spinny feeling" in the morning, though not every morning and denies any acute onset. She notes that this improves after she takes Clonopin.  Lab results today (06/04/18) of CBC w/diff is as follows: all values are WNL except for RBC at 5.24. 06/04/18 CMP is pending   On review of systems, pt reports moving her bowels well, eating well, staying hydrated, decreased cervical lymph node size, improved sense of nasal fullness, occasional sense of spinning in the morning, and denies difficulty urinating, abdominal pains, leg swelling, and any other symptoms.    MEDICAL HISTORY:  Past Medical History:  Diagnosis Date  . Anxiety   . Breast cancer ([place])   . Chest pain   . Depression   . DOE (dyspnea on exertion)   . Eustachian tube obstruction, bilateral   . Fibromyalgia   . Gastric reflux   . History of kidney stones   . Hyperlipidemia   . Hypertension   . IBS (irritable bowel syndrome)   . Interstitial cystitis   . Nephrolithiasis   . Nephrolithiasis   . Palpitations   . Pre-diabetes   . Sepsis ([place])     SURGICAL HISTORY: Past Surgical History:  Procedure Laterality Date  . BREAST DUCTAL SYSTEM EXCISION Right 10/30/2016   Procedure: RIGHT BREAST DUCT EXCISION;  Surgeon: Rolm Bookbinder, MD;  Location: [facility];  Service: General;  Laterality: Right;  . EYE SURGERY     bilateral lense placements  . MASTECTOMY Left   . NECK SURGERY     muscles cut in her neck  . NEPHROSTOMY    . TONSILLECTOMY       SOCIAL HISTORY: Social History   Socioeconomic History  . Marital status: Widowed    Spouse name: Not on file  . Number of children: 4  . Years of education: Not on file  . Highest education level: Not on file  Occupational History  . Not on file  Social Needs  . Financial resource strain: Not on file  . Food insecurity:    Worry: Not on file    Inability: Not on file  . Transportation needs:    Medical: Not on file    Non-medical: Not on file  Tobacco Use  . Smoking status: Never Smoker  . Smokeless tobacco: Never Used  Substance and Sexual Activity  . Alcohol use: No  . Drug use: No  . Sexual activity: Not on file  Lifestyle  . Physical activity:    Days per week: Not on file    Minutes per session: Not on file  . Stress: Not on file  Relationships  . Social connections:  Talks on phone: Not on file    Gets together: Not on file    Attends religious service: Not on file    Active member of club or organization: Not on file    Attends meetings of clubs or organizations: Not on file    Relationship status: Not on file  . Intimate partner violence:    Fear of current or ex partner: Not on file    Emotionally abused: Not on file    Physically abused: Not on file    Forced sexual activity: Not on file  Other Topics Concern  . Not on file  Social History Narrative  . Not on file    FAMILY HISTORY: Family History  Problem Relation Age of Onset  . CAD Father   . Heart attack Father   . Dementia Mother     ALLERGIES:  is allergic to amoxicillin and no known allergies.  MEDICATIONS:  Current Outpatient Medications  Medication Sig Dispense Refill  . amLODipine (NORVASC) 2.5 MG tablet Take 2.5 mg by mouth daily.    Marland Kitchen aspirin EC 81 MG tablet Take 1 tablet (81 mg total) by mouth daily. 90 tablet 3  . atorvastatin (LIPITOR) 20 MG tablet Take 1 tablet (20 mg total) by mouth daily at 6 PM. 90 tablet 3  . clonazePAM (KLONOPIN) 1 MG tablet Take 1 mg by mouth 2  (two) times daily as needed for anxiety.     . Ibuprofen-Diphenhydramine HCl (ADVIL PM) 200-25 MG CAPS Take 0.5-1 tablets by mouth daily as needed (sleep).     . metoprolol succinate (TOPROL-XL) 25 MG 24 hr tablet Take 25 mg by mouth every evening.    Marland Kitchen omeprazole (PRILOSEC) 40 MG capsule Take 40 mg by mouth daily.    . ondansetron (ZOFRAN) 8 MG tablet Take 1 tablet (8 mg total) by mouth 2 (two) times daily as needed for nausea or vomiting. 40 tablet 1  . pentosan polysulfate (ELMIRON) 100 MG capsule Take 100 mg by mouth 3 (three) times daily.     Vladimir Faster Glycol-Propyl Glycol (SYSTANE OP) Apply 1 drop to eye 2 (two) times daily.     No current facility-administered medications for this visit.     REVIEW OF SYSTEMS:    A 10+ POINT REVIEW OF SYSTEMS WAS OBTAINED including neurology, dermatology, psychiatry, cardiac, respiratory, lymph, extremities, GI, GU, Musculoskeletal, constitutional, breasts, reproductive, HEENT.  All pertinent positives are noted in the HPI.  All others are negative.   PHYSICAL EXAMINATION: ECOG PERFORMANCE STATUS: 2 - Symptomatic, <50% confined to bed  . Vitals:   06/04/18 1458  BP: 139/76  Pulse: 79  Resp: 17  Temp: 98.1 F (36.7 C)  SpO2: 100%   Filed Weights   06/04/18 1458  Weight: 159 lb (72.1 kg)   .Body mass index is 28.17 kg/m.  GENERAL:alert, in no acute distress and comfortable SKIN: no acute rashes, no significant lesions EYES: conjunctiva are pink and non-injected, sclera anicteric OROPHARYNX: MMM, no exudates, no oropharyngeal erythema or ulceration NECK: supple, no JVD LYMPH:  Palpable lymphadenopathy in the left cervical, 4-5cm palpable LN in right cervical, no palpable lymphadenopathy in the axillary or inguinal regions LUNGS: clear to auscultation b/l with normal respiratory effort HEART: regular rate & rhythm ABDOMEN:  normoactive bowel sounds , non tender, not distended. No palpable hepatosplenomegaly.  Extremity: no pedal  edema PSYCH: alert & oriented x 3 with fluent speech NEURO: no focal motor/sensory deficits   LABORATORY DATA:  I have reviewed  the data as listed  . CBC Latest Ref Rng & Units 06/04/2018 05/22/2018 05/10/2018  WBC 4.0 - 10.5 K/uL 6.3 6.1 5.0  Hemoglobin 12.0 - 15.0 g/dL 15.0 14.6 14.8  Hematocrit 36.0 - 46.0 % 44.7 43.9 43.9  Platelets 150 - 400 K/uL 206 200 90(L)    . CMP Latest Ref Rng & Units 05/22/2018 05/10/2018 04/04/2018  Glucose 70 - 99 mg/dL 78 91 79  BUN 8 - 23 mg/dL 20 16 13   Creatinine 0.44 - 1.00 mg/dL 0.91 0.83 0.86  Sodium 135 - 145 mmol/L 142 140 143  Potassium 3.5 - 5.1 mmol/L 4.2 4.1 4.2  Chloride 98 - 111 mmol/L 107 111 107  CO2 22 - 32 mmol/L 26 22 26   Calcium 8.9 - 10.3 mg/dL 10.5(H) 9.8 10.5(H)  Total Protein 6.5 - 8.1 g/dL 7.1 6.8 7.4  Total Bilirubin 0.3 - 1.2 mg/dL 1.1 1.1 1.3(H)  Alkaline Phos 38 - 126 U/L 79 89 91  AST 15 - 41 U/L 19 22 29   ALT 0 - 44 U/L 28 30 38    03/22/18 Flow Cytometry:   03/22/18 Biopsy:     RADIOGRAPHIC STUDIES: I have personally reviewed the radiological images as listed and agreed with the findings in the report. No results found.  ASSESSMENT & PLAN:   77 y.o. female with  1. 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 2. RIGHT lymphadenitis seen with superimposed infection or pericapsular spread of tumor.    03/22/18 biopsy revealed follicular lymphoma, Grade 3a   04/04/18 Hep B and Hep C negative   04/17/18 PET/CT revealed Notable bilateral adenopathy in the neck and posterior nasopharynx is hypermetabolic and mostly Deauville 5 level activity, compatible with malignancy. 2. No pathologically enlarged or significantly hypermetabolic adenopathy in the chest, or abdomen/pelvis. No splenomegaly. Slight enlargement of the  photopenic splenic cyst noted on remote prior Exams. 3. Other imaging findings of potential clinical significance: Aortic Atherosclerosis. Coronary atherosclerosis. Diffuse hepatic steatosis. Nonobstructive left nephrolithiasis.   2. Interstitial cystitis   PLAN: -Discussed pt labwork today, 06/04/18; blood counts are stable  -The pt has no prohibitive toxicities from beginning C1 BR with dose reduced Bendamustine to 70mg /m2 at this time.    -Suspect possible nerve compression in right ear and cheek that is expected to improve after treatment   -Intending treatment of either Bendamustine and Rituxan for 4-6 cycles vs Rituxan alone. Will not treat with R-CHOP given interstitial cystitis. -Follow up with Urology Dr. Alona Bene -Pt tolerated first infusion of Rituxan very well  -Discussed that the intent of treatment is to achieve remission, is not curative -Will see the pt back in 2-3 weeks    RTC with Dr Irene Limbo in 2 weeks with labs for toxicity check Plz schedule C3 of BR chemotherapy as per orders with labs and MD visit      All of the patients questions were answered with apparent satisfaction. The patient knows to call the clinic with any problems, questions or concerns.  The total time spent in the appt was 25 minutes and more than 50% was on counseling and direct patient cares.     Sullivan Lone MD [place] AAHIVMS [facility] Hematology/Oncology Physician [facility]  (Office):       [phone] (Work cell):  [phone] (Fax):           [phone]  06/04/2018  3:21 PM  I, Schuyler Bain, am acting as a scribe for Dr. Sullivan Lone.   .I have reviewed the above documentation for accuracy and completeness, and I agree with the above. [name] MD

## 2018-06-04 ENCOUNTER — Inpatient Hospital Stay (HOSPITAL_BASED_OUTPATIENT_CLINIC_OR_DEPARTMENT_OTHER): Payer: Medicare Other | Admitting: Hematology

## 2018-06-04 ENCOUNTER — Telehealth: Payer: Self-pay | Admitting: Hematology

## 2018-06-04 ENCOUNTER — Inpatient Hospital Stay: Payer: Medicare Other

## 2018-06-04 VITALS — BP 139/76 | HR 79 | Temp 98.1°F | Resp 17 | Ht 63.0 in | Wt 159.0 lb

## 2018-06-04 DIAGNOSIS — Z5112 Encounter for antineoplastic immunotherapy: Secondary | ICD-10-CM | POA: Diagnosis not present

## 2018-06-04 DIAGNOSIS — R7303 Prediabetes: Secondary | ICD-10-CM | POA: Diagnosis not present

## 2018-06-04 DIAGNOSIS — I10 Essential (primary) hypertension: Secondary | ICD-10-CM

## 2018-06-04 DIAGNOSIS — K760 Fatty (change of) liver, not elsewhere classified: Secondary | ICD-10-CM

## 2018-06-04 DIAGNOSIS — E785 Hyperlipidemia, unspecified: Secondary | ICD-10-CM | POA: Diagnosis not present

## 2018-06-04 DIAGNOSIS — M797 Fibromyalgia: Secondary | ICD-10-CM | POA: Diagnosis not present

## 2018-06-04 DIAGNOSIS — Z7982 Long term (current) use of aspirin: Secondary | ICD-10-CM

## 2018-06-04 DIAGNOSIS — Z79899 Other long term (current) drug therapy: Secondary | ICD-10-CM

## 2018-06-04 DIAGNOSIS — R0602 Shortness of breath: Secondary | ICD-10-CM

## 2018-06-04 DIAGNOSIS — Z9012 Acquired absence of left breast and nipple: Secondary | ICD-10-CM | POA: Diagnosis not present

## 2018-06-04 DIAGNOSIS — H9190 Unspecified hearing loss, unspecified ear: Secondary | ICD-10-CM | POA: Diagnosis not present

## 2018-06-04 DIAGNOSIS — I2510 Atherosclerotic heart disease of native coronary artery without angina pectoris: Secondary | ICD-10-CM

## 2018-06-04 DIAGNOSIS — R42 Dizziness and giddiness: Secondary | ICD-10-CM

## 2018-06-04 DIAGNOSIS — R200 Anesthesia of skin: Secondary | ICD-10-CM | POA: Diagnosis not present

## 2018-06-04 DIAGNOSIS — Z853 Personal history of malignant neoplasm of breast: Secondary | ICD-10-CM | POA: Diagnosis not present

## 2018-06-04 DIAGNOSIS — R61 Generalized hyperhidrosis: Secondary | ICD-10-CM

## 2018-06-04 DIAGNOSIS — C8238 Follicular lymphoma grade IIIa, lymph nodes of multiple sites: Secondary | ICD-10-CM

## 2018-06-04 DIAGNOSIS — C8231 Follicular lymphoma grade IIIa, lymph nodes of head, face, and neck: Secondary | ICD-10-CM | POA: Diagnosis not present

## 2018-06-04 DIAGNOSIS — H9201 Otalgia, right ear: Secondary | ICD-10-CM | POA: Diagnosis not present

## 2018-06-04 DIAGNOSIS — Z9223 Personal history of estrogen therapy: Secondary | ICD-10-CM

## 2018-06-04 DIAGNOSIS — K219 Gastro-esophageal reflux disease without esophagitis: Secondary | ICD-10-CM | POA: Diagnosis not present

## 2018-06-04 DIAGNOSIS — N3010 Interstitial cystitis (chronic) without hematuria: Secondary | ICD-10-CM | POA: Diagnosis not present

## 2018-06-04 DIAGNOSIS — N200 Calculus of kidney: Secondary | ICD-10-CM | POA: Diagnosis not present

## 2018-06-04 DIAGNOSIS — Z791 Long term (current) use of non-steroidal anti-inflammatories (NSAID): Secondary | ICD-10-CM

## 2018-06-04 DIAGNOSIS — Z5111 Encounter for antineoplastic chemotherapy: Secondary | ICD-10-CM | POA: Diagnosis not present

## 2018-06-04 DIAGNOSIS — F419 Anxiety disorder, unspecified: Secondary | ICD-10-CM

## 2018-06-04 LAB — CMP (CANCER CENTER ONLY)
ALT: 35 U/L (ref 0–44)
AST: 23 U/L (ref 15–41)
Albumin: 4.3 g/dL (ref 3.5–5.0)
Alkaline Phosphatase: 92 U/L (ref 38–126)
Anion gap: 10 (ref 5–15)
BUN: 18 mg/dL (ref 8–23)
CHLORIDE: 109 mmol/L (ref 98–111)
CO2: 22 mmol/L (ref 22–32)
Calcium: 10.8 mg/dL — ABNORMAL HIGH (ref 8.9–10.3)
Creatinine: 0.98 mg/dL (ref 0.44–1.00)
GFR, Est AFR Am: >60 mL/min (ref >60)
GFR, Estimated: 56 mL/min — ABNORMAL LOW (ref >60)
Glucose, Bld: 94 mg/dL (ref 70–99)
Potassium: 3.9 mmol/L (ref 3.5–5.1)
SODIUM: 141 mmol/L (ref 135–145)
Total Bilirubin: 1.4 mg/dL — ABNORMAL HIGH (ref 0.3–1.2)
Total Protein: 7.3 g/dL (ref 6.5–8.1)

## 2018-06-04 LAB — CBC WITH DIFFERENTIAL/PLATELET
Abs Immature Granulocytes: 0.02 10*3/uL (ref 0.00–0.07)
BASOS ABS: 0.1 10*3/uL (ref 0.0–0.1)
Basophils Relative: 1 %
Eosinophils Absolute: 0.1 10*3/uL (ref 0.0–0.5)
Eosinophils Relative: 2 %
HEMATOCRIT: 44.7 % (ref 36.0–46.0)
Hemoglobin: 15 g/dL (ref 12.0–15.0)
Immature Granulocytes: 0 %
LYMPHS ABS: 1.9 10*3/uL (ref 0.7–4.0)
Lymphocytes Relative: 30 %
MCH: 28.6 pg (ref 26.0–34.0)
MCHC: 33.6 g/dL (ref 30.0–36.0)
MCV: 85.3 fL (ref 80.0–100.0)
Monocytes Absolute: 0.6 10*3/uL (ref 0.1–1.0)
Monocytes Relative: 9 %
NEUTROS PCT: 58 %
Neutro Abs: 3.6 10*3/uL (ref 1.7–7.7)
Platelets: 206 10*3/uL (ref 150–400)
RBC: 5.24 MIL/uL — ABNORMAL HIGH (ref 3.87–5.11)
RDW: 12.6 % (ref 11.5–15.5)
WBC: 6.3 10*3/uL (ref 4.0–10.5)
nRBC: 0 % (ref 0.0–0.2)

## 2018-06-04 NOTE — Telephone Encounter (Signed)
Printed calendar and avs. °

## 2018-06-04 NOTE — Patient Instructions (Signed)
Thank you for choosing [facility] to provide your oncology and hematology care.  To afford each patient quality time with our providers, please arrive 30 minutes before your scheduled appointment time.  If you arrive late for your appointment, you may be asked to reschedule.  We strive to give you quality time with our providers, and arriving late affects you and other patients whose appointments are after yours.    If you are a no show for multiple scheduled visits, you may be dismissed from the clinic at the providers discretion.     Again, thank you for choosing [facility], our hope is that these requests will decrease the amount of time that you wait before being seen by our physicians.  ______________________________________________________________________   Should you have questions after your visit to the  Cancer Center, please contact our office at [phone] between the hours of 8:30 and 4:30 p.m.    Voicemails left after 4:30p.m will not be returned until the following business day.     For prescription refill requests, please have your pharmacy contact us directly.  Please also try to allow 48 hours for prescription requests.     Please contact the scheduling department for questions regarding scheduling.  For scheduling of procedures such as PET scans, CT scans, MRI, Ultrasound, etc please contact central scheduling at [phone].     Resources For Cancer Patients and Caregivers:    Oncolink.org:  A wonderful resource for patients and healthcare providers for information regarding your disease, ways to tract your treatment, what to expect, etc.      American Cancer Society:  800-227-2345  Can help patients locate various types of support and financial assistance   Cancer Care: 1-800-813-HOPE (4673) Provides financial assistance, online support groups, medication/co-pay assistance.     [place] DSS:  336-641-3447 Where to apply  for food stamps, Medicaid, and utility assistance   Medicare Rights Center: 800-333-4114 Helps people with Medicare understand their rights and benefits, navigate the Medicare system, and secure the quality healthcare they deserve   SCAT: 336-333-6589 [place] [employer]'s shared-ride transportation service for eligible riders who have a disability that prevents them from riding the fixed route bus.     For additional information on assistance programs please contact our social worker:   Abigail Elmore:  [phone]  

## 2018-06-06 ENCOUNTER — Ambulatory Visit: Payer: Medicare Other | Admitting: Hematology

## 2018-06-06 ENCOUNTER — Other Ambulatory Visit: Payer: Medicare Other

## 2018-06-06 ENCOUNTER — Inpatient Hospital Stay: Payer: Medicare Other

## 2018-06-06 VITALS — BP 130/59 | HR 75 | Temp 97.7°F | Resp 16

## 2018-06-06 DIAGNOSIS — C8231 Follicular lymphoma grade IIIa, lymph nodes of head, face, and neck: Secondary | ICD-10-CM | POA: Diagnosis not present

## 2018-06-06 DIAGNOSIS — I2510 Atherosclerotic heart disease of native coronary artery without angina pectoris: Secondary | ICD-10-CM | POA: Diagnosis not present

## 2018-06-06 DIAGNOSIS — Z7982 Long term (current) use of aspirin: Secondary | ICD-10-CM | POA: Diagnosis not present

## 2018-06-06 DIAGNOSIS — H9201 Otalgia, right ear: Secondary | ICD-10-CM | POA: Diagnosis not present

## 2018-06-06 DIAGNOSIS — Z853 Personal history of malignant neoplasm of breast: Secondary | ICD-10-CM | POA: Diagnosis not present

## 2018-06-06 DIAGNOSIS — Z79899 Other long term (current) drug therapy: Secondary | ICD-10-CM | POA: Diagnosis not present

## 2018-06-06 DIAGNOSIS — R0602 Shortness of breath: Secondary | ICD-10-CM | POA: Diagnosis not present

## 2018-06-06 DIAGNOSIS — Z5112 Encounter for antineoplastic immunotherapy: Secondary | ICD-10-CM | POA: Diagnosis not present

## 2018-06-06 DIAGNOSIS — I10 Essential (primary) hypertension: Secondary | ICD-10-CM | POA: Diagnosis not present

## 2018-06-06 DIAGNOSIS — Z5111 Encounter for antineoplastic chemotherapy: Secondary | ICD-10-CM | POA: Diagnosis not present

## 2018-06-06 DIAGNOSIS — R61 Generalized hyperhidrosis: Secondary | ICD-10-CM | POA: Diagnosis not present

## 2018-06-06 DIAGNOSIS — R200 Anesthesia of skin: Secondary | ICD-10-CM | POA: Diagnosis not present

## 2018-06-06 DIAGNOSIS — H9190 Unspecified hearing loss, unspecified ear: Secondary | ICD-10-CM | POA: Diagnosis not present

## 2018-06-06 DIAGNOSIS — N3010 Interstitial cystitis (chronic) without hematuria: Secondary | ICD-10-CM | POA: Diagnosis not present

## 2018-06-06 DIAGNOSIS — R7303 Prediabetes: Secondary | ICD-10-CM | POA: Diagnosis not present

## 2018-06-06 DIAGNOSIS — Z791 Long term (current) use of non-steroidal anti-inflammatories (NSAID): Secondary | ICD-10-CM | POA: Diagnosis not present

## 2018-06-06 DIAGNOSIS — M797 Fibromyalgia: Secondary | ICD-10-CM | POA: Diagnosis not present

## 2018-06-06 DIAGNOSIS — Z9012 Acquired absence of left breast and nipple: Secondary | ICD-10-CM | POA: Diagnosis not present

## 2018-06-06 DIAGNOSIS — K219 Gastro-esophageal reflux disease without esophagitis: Secondary | ICD-10-CM | POA: Diagnosis not present

## 2018-06-06 DIAGNOSIS — R42 Dizziness and giddiness: Secondary | ICD-10-CM | POA: Diagnosis not present

## 2018-06-06 DIAGNOSIS — N200 Calculus of kidney: Secondary | ICD-10-CM | POA: Diagnosis not present

## 2018-06-06 DIAGNOSIS — Z7189 Other specified counseling: Secondary | ICD-10-CM

## 2018-06-06 DIAGNOSIS — E785 Hyperlipidemia, unspecified: Secondary | ICD-10-CM | POA: Diagnosis not present

## 2018-06-06 DIAGNOSIS — K760 Fatty (change of) liver, not elsewhere classified: Secondary | ICD-10-CM | POA: Diagnosis not present

## 2018-06-06 DIAGNOSIS — C8238 Follicular lymphoma grade IIIa, lymph nodes of multiple sites: Secondary | ICD-10-CM

## 2018-06-06 MED ORDER — ACETAMINOPHEN 325 MG PO TABS
ORAL_TABLET | ORAL | Status: AC
  Filled 2018-06-06: qty 2

## 2018-06-06 MED ORDER — DIPHENHYDRAMINE HCL 25 MG PO CAPS
ORAL_CAPSULE | ORAL | Status: AC
  Filled 2018-06-06: qty 2

## 2018-06-06 MED ORDER — PALONOSETRON HCL INJECTION 0.25 MG/5ML
0.2500 mg | Freq: Once | INTRAVENOUS | Status: AC
  Administered 2018-06-06: 0.25 mg via INTRAVENOUS

## 2018-06-06 MED ORDER — DEXAMETHASONE SODIUM PHOSPHATE 10 MG/ML IJ SOLN
INTRAMUSCULAR | Status: AC
  Filled 2018-06-06: qty 1

## 2018-06-06 MED ORDER — DIPHENHYDRAMINE HCL 25 MG PO CAPS
50.0000 mg | ORAL_CAPSULE | Freq: Once | ORAL | Status: AC
  Administered 2018-06-06: 50 mg via ORAL

## 2018-06-06 MED ORDER — PALONOSETRON HCL INJECTION 0.25 MG/5ML
INTRAVENOUS | Status: AC
  Filled 2018-06-06: qty 5

## 2018-06-06 MED ORDER — SODIUM CHLORIDE 0.9 % IV SOLN
70.0000 mg/m2 | Freq: Once | INTRAVENOUS | Status: AC
  Administered 2018-06-06: 125 mg via INTRAVENOUS
  Filled 2018-06-06: qty 5

## 2018-06-06 MED ORDER — SODIUM CHLORIDE 0.9 % IV SOLN
375.0000 mg/m2 | Freq: Once | INTRAVENOUS | Status: AC
  Administered 2018-06-06: 700 mg via INTRAVENOUS
  Filled 2018-06-06: qty 20

## 2018-06-06 MED ORDER — ACETAMINOPHEN 325 MG PO TABS
650.0000 mg | ORAL_TABLET | Freq: Once | ORAL | Status: AC
  Administered 2018-06-06: 650 mg via ORAL

## 2018-06-06 MED ORDER — DEXAMETHASONE SODIUM PHOSPHATE 10 MG/ML IJ SOLN
10.0000 mg | Freq: Once | INTRAMUSCULAR | Status: AC
  Administered 2018-06-06: 10 mg via INTRAVENOUS

## 2018-06-06 MED ORDER — SODIUM CHLORIDE 0.9 % IV SOLN
Freq: Once | INTRAVENOUS | Status: AC
  Administered 2018-06-06: 08:00:00 via INTRAVENOUS
  Filled 2018-06-06: qty 250

## 2018-06-06 NOTE — Patient Instructions (Signed)
Time Discharge Instructions for Patients Receiving Chemotherapy  Today you received the following chemotherapy agents Rituxan; bendamustine   To help prevent nausea and vomiting after your treatment, we encourage you to take your nausea medication as prescribed   If you develop nausea and vomiting that is not controlled by your nausea medication, call the clinic.   BELOW ARE SYMPTOMS THAT SHOULD BE REPORTED IMMEDIATELY:  *FEVER GREATER THAN 100.5 F  *CHILLS WITH OR WITHOUT FEVER  NAUSEA AND VOMITING THAT IS NOT CONTROLLED WITH YOUR NAUSEA MEDICATION  *UNUSUAL SHORTNESS OF BREATH  *UNUSUAL BRUISING OR BLEEDING  TENDERNESS IN MOUTH AND THROAT WITH OR WITHOUT PRESENCE OF ULCERS  *URINARY PROBLEMS  *BOWEL PROBLEMS  UNUSUAL RASH Items with * indicate a potential emergency and should be followed up as soon as possible.  Feel free to call the clinic should you have any questions or concerns. The clinic phone number is [phone].  Please show the [place] at check-in to the Emergency Department and triage nurse.  Rituximab; Hyaluronidase injection (Rituxan) What is this medicine? RITUXIMAB; HYALURONIDASE (ri TUX i mab / hye al ur ON i dase) is used to treat non-Hodgkin lymphoma and chronic lymphocytic leukemia. Rituximab is a monoclonal antibody. Hyaluronidase is used to improve the effects of rituximab. This medicine may be used for other purposes; ask your health care provider or pharmacist if you have questions. COMMON BRAND NAME(S): Rituxan Hycela What should I tell my health care provider before I take this medicine? They need to know if you have any of these conditions: -heart disease -infection (especially a virus infection such as hepatitis B, chickenpox, cold sores, or herpes) -immune system problems -irregular heartbeat -kidney disease -lung or breathing disease, like asthma -recently received or scheduled to receive a vaccine -an  unusual or allergic reaction to rituximab, rituximab/hyaluronidase, mouse proteins, other medicines, foods, dyes, or preservatives -pregnant or trying to get pregnant -breast-feeding How should I use this medicine? This medicine is for injection under the skin. It is given by a health care professional in a hospital or clinic setting. A special MedGuide will be given to you before each treatment. Be sure to read this information carefully each time. Talk to your pediatrician regarding the use of this medicine in children. Special care may be needed. Overdosage: If you think you have taken too much of this medicine contact a poison control center or emergency room at once. NOTE: This medicine is only for you. Do not share this medicine with others. What if I miss a dose? It is important not to miss your dose. Call your doctor or health care professional if you are unable to keep an appointment. What may interact with this medicine? This medicine may interact with the following medications: -cisplatin -live virus vaccines This list may not describe all possible interactions. Give your health care provider a list of all the medicines, herbs, non-prescription drugs, or dietary supplements you use. Also tell them if you smoke, drink alcohol, or use illegal drugs. Some items may interact with your medicine. What should I watch for while using this medicine? Your condition will be monitored carefully while you are receiving this medicine. You may need blood work done while you are taking this medicine. This medicine can cause serious allergic reactions. To reduce your risk you may need to take medicine before treatment with this medicine. Take your medicine as directed. In some patients, this medicine may cause a serious brain infection that may  cause death. If you have any problems seeing, thinking, speaking, walking, or standing, tell your doctor right away. If you cannot reach your doctor, urgently  seek other source of medical care. Call your doctor or health care professional for advice if you get a fever, chills or sore throat, or other symptoms of a cold or flu. Do not treat yourself. This drug decreases your body's ability to fight infections. Try to avoid being around people who are sick. Do not become pregnant while taking this medicine or for 12 months after stopping it. Women should inform their doctor if they wish to become pregnant or think they might be pregnant. There is a potential for serious side effects to an unborn child. Talk to your health care professional or pharmacist for more information. Do not breast-feed an infant while taking this medicine or for at least 6 months after stopping it. What side effects may I notice from receiving this medicine? Side effects that you should report to your doctor or health care professional as soon as possible: -breathing problems -chest pain -dizziness or feeling faint -fast, irregular heartbeat -low blood counts - this medicine may decrease the number of white blood cells, red blood cells and platelets. You may be at increased risk for infections and bleeding. -mouth sores -redness, blistering, peeling or loosening of the skin, including inside the mouth (this can be added for any serious or exfoliative rash that could lead to hospitalization) -signs of infection - fever or chills, cough, sore throat, pain or difficulty passing urine -signs and symptoms of kidney injury like trouble passing urine or change in the amount of urine -signs and symptoms of liver injury like dark yellow or brown urine; general ill feeling or flu-like symptoms; light-colored stools; loss of appetite; nausea; right upper belly pain; unusually weak or tired; yellowing of the eyes or skin -stomach pain -vomiting Side effects that usually do not require medical attention (report these to your doctor or health care professional if they continue or are  bothersome): -constipation -hair loss -headache -muscle cramps or muscle pain -pain, redness, or irritation at site where injected This list may not describe all possible side effects. Call your doctor for medical advice about side effects. You may report side effects to FDA at 1-800-FDA-1088. Where should I keep my medicine? This drug is given in a hospital or clinic and will not be stored at home. NOTE: This sheet is a summary. It may not cover all possible information. If you have questions about this medicine, talk to your doctor, pharmacist, or health care provider.  2018 Elsevier/Gold Standard (2016-01-05 15:36:38)

## 2018-06-07 ENCOUNTER — Inpatient Hospital Stay: Payer: Medicare Other

## 2018-06-07 VITALS — BP 130/84 | HR 66 | Temp 98.1°F | Resp 16

## 2018-06-07 DIAGNOSIS — K219 Gastro-esophageal reflux disease without esophagitis: Secondary | ICD-10-CM | POA: Diagnosis not present

## 2018-06-07 DIAGNOSIS — Z9012 Acquired absence of left breast and nipple: Secondary | ICD-10-CM | POA: Diagnosis not present

## 2018-06-07 DIAGNOSIS — R61 Generalized hyperhidrosis: Secondary | ICD-10-CM | POA: Diagnosis not present

## 2018-06-07 DIAGNOSIS — C8238 Follicular lymphoma grade IIIa, lymph nodes of multiple sites: Secondary | ICD-10-CM

## 2018-06-07 DIAGNOSIS — K760 Fatty (change of) liver, not elsewhere classified: Secondary | ICD-10-CM | POA: Diagnosis not present

## 2018-06-07 DIAGNOSIS — C8231 Follicular lymphoma grade IIIa, lymph nodes of head, face, and neck: Secondary | ICD-10-CM | POA: Diagnosis not present

## 2018-06-07 DIAGNOSIS — M797 Fibromyalgia: Secondary | ICD-10-CM | POA: Diagnosis not present

## 2018-06-07 DIAGNOSIS — R42 Dizziness and giddiness: Secondary | ICD-10-CM | POA: Diagnosis not present

## 2018-06-07 DIAGNOSIS — Z791 Long term (current) use of non-steroidal anti-inflammatories (NSAID): Secondary | ICD-10-CM | POA: Diagnosis not present

## 2018-06-07 DIAGNOSIS — I10 Essential (primary) hypertension: Secondary | ICD-10-CM | POA: Diagnosis not present

## 2018-06-07 DIAGNOSIS — E785 Hyperlipidemia, unspecified: Secondary | ICD-10-CM | POA: Diagnosis not present

## 2018-06-07 DIAGNOSIS — H9201 Otalgia, right ear: Secondary | ICD-10-CM | POA: Diagnosis not present

## 2018-06-07 DIAGNOSIS — H9190 Unspecified hearing loss, unspecified ear: Secondary | ICD-10-CM | POA: Diagnosis not present

## 2018-06-07 DIAGNOSIS — Z7189 Other specified counseling: Secondary | ICD-10-CM

## 2018-06-07 DIAGNOSIS — R200 Anesthesia of skin: Secondary | ICD-10-CM | POA: Diagnosis not present

## 2018-06-07 DIAGNOSIS — R0602 Shortness of breath: Secondary | ICD-10-CM | POA: Diagnosis not present

## 2018-06-07 DIAGNOSIS — Z79899 Other long term (current) drug therapy: Secondary | ICD-10-CM | POA: Diagnosis not present

## 2018-06-07 DIAGNOSIS — N200 Calculus of kidney: Secondary | ICD-10-CM | POA: Diagnosis not present

## 2018-06-07 DIAGNOSIS — Z853 Personal history of malignant neoplasm of breast: Secondary | ICD-10-CM | POA: Diagnosis not present

## 2018-06-07 DIAGNOSIS — Z5111 Encounter for antineoplastic chemotherapy: Secondary | ICD-10-CM | POA: Diagnosis not present

## 2018-06-07 DIAGNOSIS — Z7982 Long term (current) use of aspirin: Secondary | ICD-10-CM | POA: Diagnosis not present

## 2018-06-07 DIAGNOSIS — I2510 Atherosclerotic heart disease of native coronary artery without angina pectoris: Secondary | ICD-10-CM | POA: Diagnosis not present

## 2018-06-07 DIAGNOSIS — R7303 Prediabetes: Secondary | ICD-10-CM | POA: Diagnosis not present

## 2018-06-07 DIAGNOSIS — Z5112 Encounter for antineoplastic immunotherapy: Secondary | ICD-10-CM | POA: Diagnosis not present

## 2018-06-07 DIAGNOSIS — N3010 Interstitial cystitis (chronic) without hematuria: Secondary | ICD-10-CM | POA: Diagnosis not present

## 2018-06-07 MED ORDER — SODIUM CHLORIDE 0.9 % IV SOLN
Freq: Once | INTRAVENOUS | Status: AC
  Administered 2018-06-07: 10:00:00 via INTRAVENOUS
  Filled 2018-06-07: qty 250

## 2018-06-07 MED ORDER — DEXAMETHASONE SODIUM PHOSPHATE 10 MG/ML IJ SOLN
10.0000 mg | Freq: Once | INTRAMUSCULAR | Status: AC
  Administered 2018-06-07: 10 mg via INTRAVENOUS

## 2018-06-07 MED ORDER — SODIUM CHLORIDE 0.9 % IV SOLN
70.0000 mg/m2 | Freq: Once | INTRAVENOUS | Status: AC
  Administered 2018-06-07: 125 mg via INTRAVENOUS
  Filled 2018-06-07: qty 5

## 2018-06-07 MED ORDER — DEXAMETHASONE SODIUM PHOSPHATE 10 MG/ML IJ SOLN
INTRAMUSCULAR | Status: AC
  Filled 2018-06-07: qty 1

## 2018-06-07 NOTE — Patient Instructions (Signed)
[facility] Discharge Instructions for Patients Receiving Chemotherapy  Today you received the following chemotherapy agents: bendamustine   To help prevent nausea and vomiting after your treatment, we encourage you to take your nausea medication as prescribed   If you develop nausea and vomiting that is not controlled by your nausea medication, call the clinic.   BELOW ARE SYMPTOMS THAT SHOULD BE REPORTED IMMEDIATELY:  *FEVER GREATER THAN 100.5 F  *CHILLS WITH OR WITHOUT FEVER  NAUSEA AND VOMITING THAT IS NOT CONTROLLED WITH YOUR NAUSEA MEDICATION  *UNUSUAL SHORTNESS OF BREATH  *UNUSUAL BRUISING OR BLEEDING  TENDERNESS IN MOUTH AND THROAT WITH OR WITHOUT PRESENCE OF ULCERS  *URINARY PROBLEMS  *BOWEL PROBLEMS  UNUSUAL RASH Items with * indicate a potential emergency and should be followed up as soon as possible.  Feel free to call the clinic should you have any questions or concerns. The clinic phone number is [phone].  Please show the [facility] at check-in to the Emergency Department and triage nurse.  Rituximab; Hyaluronidase injection (Rituxan) What is this medicine? RITUXIMAB; HYALURONIDASE (ri TUX i mab / hye al ur ON i dase) is used to treat non-Hodgkin lymphoma and chronic lymphocytic leukemia. Rituximab is a monoclonal antibody. Hyaluronidase is used to improve the effects of rituximab. This medicine may be used for other purposes; ask your health care provider or pharmacist if you have questions. COMMON BRAND NAME(S): Rituxan Hycela What should I tell my health care provider before I take this medicine? They need to know if you have any of these conditions: -heart disease -infection (especially a virus infection such as hepatitis B, chickenpox, cold sores, or herpes) -immune system problems -irregular heartbeat -kidney disease -lung or breathing disease, like asthma -recently received or scheduled to receive a vaccine -an unusual  or allergic reaction to rituximab, rituximab/hyaluronidase, mouse proteins, other medicines, foods, dyes, or preservatives -pregnant or trying to get pregnant -breast-feeding How should I use this medicine? This medicine is for injection under the skin. It is given by a health care professional in a hospital or clinic setting. A special MedGuide will be given to you before each treatment. Be sure to read this information carefully each time. Talk to your pediatrician regarding the use of this medicine in children. Special care may be needed. Overdosage: If you think you have taken too much of this medicine contact a poison control center or emergency room at once. NOTE: This medicine is only for you. Do not share this medicine with others. What if I miss a dose? It is important not to miss your dose. Call your doctor or health care professional if you are unable to keep an appointment. What may interact with this medicine? This medicine may interact with the following medications: -cisplatin -live virus vaccines This list may not describe all possible interactions. Give your health care provider a list of all the medicines, herbs, non-prescription drugs, or dietary supplements you use. Also tell them if you smoke, drink alcohol, or use illegal drugs. Some items may interact with your medicine. What should I watch for while using this medicine? Your condition will be monitored carefully while you are receiving this medicine. You may need blood work done while you are taking this medicine. This medicine can cause serious allergic reactions. To reduce your risk you may need to take medicine before treatment with this medicine. Take your medicine as directed. In some patients, this medicine may cause a serious brain infection that may cause  death. If you have any problems seeing, thinking, speaking, walking, or standing, tell your doctor right away. If you cannot reach your doctor, urgently seek other  source of medical care. Call your doctor or health care professional for advice if you get a fever, chills or sore throat, or other symptoms of a cold or flu. Do not treat yourself. This drug decreases your body's ability to fight infections. Try to avoid being around people who are sick. Do not become pregnant while taking this medicine or for 12 months after stopping it. Women should inform their doctor if they wish to become pregnant or think they might be pregnant. There is a potential for serious side effects to an unborn child. Talk to your health care professional or pharmacist for more information. Do not breast-feed an infant while taking this medicine or for at least 6 months after stopping it. What side effects may I notice from receiving this medicine? Side effects that you should report to your doctor or health care professional as soon as possible: -breathing problems -chest pain -dizziness or feeling faint -fast, irregular heartbeat -low blood counts - this medicine may decrease the number of white blood cells, red blood cells and platelets. You may be at increased risk for infections and bleeding. -mouth sores -redness, blistering, peeling or loosening of the skin, including inside the mouth (this can be added for any serious or exfoliative rash that could lead to hospitalization) -signs of infection - fever or chills, cough, sore throat, pain or difficulty passing urine -signs and symptoms of kidney injury like trouble passing urine or change in the amount of urine -signs and symptoms of liver injury like dark yellow or brown urine; general ill feeling or flu-like symptoms; light-colored stools; loss of appetite; nausea; right upper belly pain; unusually weak or tired; yellowing of the eyes or skin -stomach pain -vomiting Side effects that usually do not require medical attention (report these to your doctor or health care professional if they continue or are  bothersome): -constipation -hair loss -headache -muscle cramps or muscle pain -pain, redness, or irritation at site where injected This list may not describe all possible side effects. Call your doctor for medical advice about side effects. You may report side effects to FDA at 1-800-FDA-1088. Where should I keep my medicine? This drug is given in a hospital or clinic and will not be stored at home. NOTE: This sheet is a summary. It may not cover all possible information. If you have questions about this medicine, talk to your doctor, pharmacist, or health care provider.  2018 Elsevier/Gold Standard (2016-01-05 15:36:38)

## 2018-06-10 ENCOUNTER — Other Ambulatory Visit: Payer: Self-pay | Admitting: *Deleted

## 2018-06-10 ENCOUNTER — Telehealth: Payer: Self-pay | Admitting: *Deleted

## 2018-06-10 MED ORDER — PROCHLORPERAZINE MALEATE 10 MG PO TABS: 10.0000 mg | ORAL_TABLET | Freq: Four times a day (QID) | ORAL | 0 refills | Status: DC | PRN

## 2018-06-10 NOTE — Telephone Encounter (Signed)
Oncology Nurse Navigator Documentation  Rec'd call from Ms. Stetson.  She reported onset of mild persistent nausea beginning Sunday late morning s/p Friday's infusion.  Nausea has continued since awakening this morning, becoming somewhat more intense.  She took another Zofran at 11:00 but no relief.  She requested an alternate antiemetic.  I spoke with on call MD Dr. Loreli Slot RN Charlsie Merles.  She received/placed verbal order for 10 mg q6 hour compazine to patient's preferred pharmacy.  I called Ms. Falzon to inform her of new Rx.  I encouraged her to try several doses of compazine through today, suggested potentially re-introducing Zofran tomorrow if compazine not effective.  I asked her to call me tomorrow with update.  She agreed.  [name], RN, BSN Head & Neck Oncology Nurse [facility] at [place] [phone]

## 2018-06-11 ENCOUNTER — Ambulatory Visit: Payer: Medicare Other | Admitting: Cardiology

## 2018-06-14 ENCOUNTER — Other Ambulatory Visit: Payer: Self-pay | Admitting: Hematology & Oncology

## 2018-06-17 ENCOUNTER — Telehealth: Payer: Self-pay | Admitting: *Deleted

## 2018-06-17 NOTE — Telephone Encounter (Signed)
Oncology Nurse Navigator Documentation  Rec'd call from  Ms. Vidales, confirmed her appts for tomorrow:  2:15 Lab, 2:40 Est Pt [place].  [name], RN, BSN Head & Neck Oncology Nurse [facility] at [facility] [phone]

## 2018-06-17 NOTE — Progress Notes (Signed)
HEMATOLOGY/ONCOLOGY CONSULTATION NOTE  Date of Service: 06/18/2018  Patient Care Team: [name], MD as PCP - General (Internal Medicine) Stanford Breed [name], MD as PCP - Cardiology (Cardiology) [name], MD as Consulting Physician (Hematology) [name], RN as Oncology Nurse Navigator (Oncology)  CHIEF COMPLAINTS/PURPOSE OF CONSULTATION:  Follicular lymphoma grade 3a  HISTORY OF PRESENTING ILLNESS:   Rachael Greer is a wonderful 78 y.o. female who has been referred to [name] by Dr. 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 She is accompanied today by a friend, and our Nurse Navigator [name]. The pt reports that she is doing well overall.   The pt reports that things began changing last September in 2018. She notes that she has taken Flonase, Claritin, and saline solution and did not have a runny nose, and did not have any breathing difficulty. She notes that her head felt as though she had a flu or a cold, without any other symptoms. She notes that she felt as if her ears were swollen and has had some minimal hearing impairment, and has myringotomy tubes. She began a steroid course about 4 months ago with Dr. [name]. She notes that she first noticed right sided neck swelling in the last month, and left sided neck swelling which began 2-3 weeks ago.   She obtained a CT Neck on 03/08/18, as noted below.   The pt notes that she has not had fevers or chills, but has woken up recently with night sweats occasionally over the last 6 months. She adds that she has noticed some SOB in the last 7-8 months which presents intermittently, and has not progressed. The pt notes that she has had some difficulty swallowing in the past, and saw Dr. [name] for concerns of a stricture, and adds that this she has had recent difficulty swallowing. The pt also notes that she has recently felt dizziness, described as the room spinning. She has not had her dizziness worked  up.  The pt notes that she had a mastectomy without radiation or chemotherapy for breast cancer in situ in 2013. She began tamoxifen preventatively afterwards for 2.5 years.   The pt notes that her interstitial cystitis cause is unknown. She notes that she took a TB vaccine 5-7 years ago, and developed severe pain and sepsis. She notes that she has not had recurrent UTIs and takes Elmiron under the care of Dr. [name] in urology. She notes that there are certain foods that she cannot eat without developing bladder pain. She also denies blood in the urine.  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 2. RIGHT lymphadenitis seen with superimposed infection or pericapsular spread of tumor.  Most recent lab results (03/08/18) of CBC w/diff and BMP is as follows: all values are WNL except for RBC at 5.22, HGB at 15.3.  On review of systems, pt reports some night sweats, some SOB, right neck swelling, left neck swelling, dizziness, hearing impairment, healed incision, right ear numbness, and denies fevers, chills, runny nose, abdominal pains, abdominal distension, blood in the urine, sinus pressure or pain, pain along the spine, leg swelling, skin rashes, and any other symptoms.   On PMHx the pt reports breast cancer in 2013, sternocleidomastoid release in childhood, interstitial cystitis, sepsis. On Social Hx the pt denies ever  smoking cigarettes and denies ever consuming alcohol  Interval History:  Rachael Greer returns today for management and evaluation of her Grade 3a Follicular Lymphoma, and after C1 BR. The patient's last visit with [name] was on 06/04/18. She is accompanied today by a friend and our Nurse Navigator [name]. The pt reports that she is doing well  overall.  The pt reports that she notices some minimal SOB when talking, but denies this while ambulating. She notes that she notices that she finds herself needing to take a breath mid way through her sentences.  She endorses good energy levels and denies any problems tolerating her first infusion of Bendamustine. She notes that overall her ability to breathe through her nose is improved compared to before treatment.   The pt notes that she has some mild difficulty hearing from her right ear but denies this worsening. She has had tubes placed in her ears and denies any discharge. She notes that this symptom is stable overall. She denies any pain in her ears as well.   The pt notes that she has been urinating well and denies any flares with her interstitial cystitis. The pt had one night of nausea, which was successfully resolved with Compazine.  The pt notes that she was taken off Omeprazole recently, and had been taking this for many years. She notes that she feels she observed some memory loss which is why she was stopped on Omeprazole. The pt notes some difficulty remembering the details of what she did the day before. She endorses sleeping well. She also notes that she takes Klonopin regularly due to her anxiety.   Lab results today (06/18/18) of CBC w/diff and CMP is as follows: all values are WNL except for Lymphs abs at 600, Total Bilirubin at 1.6. 06/18/18 Magnesium at 1.8.  On review of systems, pt reports minimal SOB while talking, good energy levels, mild difficulty hearing in her right ear, urinating well, controlled nausea, some anxiety, improved breathing through nose, resolved swelling, and denies ear discharge, ear pain, abdominal pains, and any other symptoms.   MEDICAL HISTORY:  Past Medical History:  Diagnosis Date  . Anxiety   . Breast cancer ([place])   . Chest pain   . Depression   . DOE (dyspnea on exertion)   . Eustachian tube obstruction, bilateral   . Fibromyalgia   .  Gastric reflux   . History of kidney stones   . Hyperlipidemia   . Hypertension   . IBS (irritable bowel syndrome)   . Interstitial cystitis   . Nephrolithiasis   . Nephrolithiasis   . Palpitations   . Pre-diabetes   . Sepsis ([place])     SURGICAL HISTORY: Past Surgical History:  Procedure Laterality Date  . BREAST DUCTAL SYSTEM EXCISION Right 10/30/2016   Procedure: RIGHT BREAST DUCT EXCISION;  Surgeon: Rolm Bookbinder, MD;  Location: [facility];  Service: General;  Laterality: Right;  . EYE SURGERY     bilateral lense placements  . MASTECTOMY Left   . NECK SURGERY     muscles cut in her neck  . NEPHROSTOMY    . TONSILLECTOMY      SOCIAL HISTORY: Social History   Socioeconomic History  . Marital status: Widowed    Spouse name: Not on file  . Number of children: 4  . Years of education: Not on file  . Highest education level: Not on file  Occupational History  . Not on file  Social Needs  . Financial  resource strain: Not on file  . Food insecurity:    Worry: Not on file    Inability: Not on file  . Transportation needs:    Medical: Not on file    Non-medical: Not on file  Tobacco Use  . Smoking status: Never Smoker  . Smokeless tobacco: Never Used  Substance and Sexual Activity  . Alcohol use: No  . Drug use: No  . Sexual activity: Not on file  Lifestyle  . Physical activity:    Days per week: Not on file    Minutes per session: Not on file  . Stress: Not on file  Relationships  . Social connections:    Talks on phone: Not on file    Gets together: Not on file    Attends religious service: Not on file    Active member of club or organization: Not on file    Attends meetings of clubs or organizations: Not on file    Relationship status: Not on file  . Intimate partner violence:    Fear of current or ex partner: Not on file    Emotionally abused: Not on file    Physically abused: Not on file    Forced sexual activity: Not on file  Other Topics Concern   . Not on file  Social History Narrative  . Not on file    FAMILY HISTORY: Family History  Problem Relation Age of Onset  . CAD Father   . Heart attack Father   . Dementia Mother     ALLERGIES:  is allergic to amoxicillin and no known allergies.  MEDICATIONS:  Current Outpatient Medications  Medication Sig Dispense Refill  . amLODipine (NORVASC) 2.5 MG tablet Take 2.5 mg by mouth daily.    Marland Kitchen aspirin EC 81 MG tablet Take 1 tablet (81 mg total) by mouth daily. 90 tablet 3  . atorvastatin (LIPITOR) 20 MG tablet Take 1 tablet (20 mg total) by mouth daily at 6 PM. 90 tablet 3  . clonazePAM (KLONOPIN) 1 MG tablet Take 1 mg by mouth 2 (two) times daily as needed for anxiety.     . Ibuprofen-Diphenhydramine HCl (ADVIL PM) 200-25 MG CAPS Take 0.5-1 tablets by mouth daily as needed (sleep).     . metoprolol succinate (TOPROL-XL) 25 MG 24 hr tablet Take 25 mg by mouth every evening.    Marland Kitchen omeprazole (PRILOSEC) 40 MG capsule Take 40 mg by mouth daily.    . ondansetron (ZOFRAN) 8 MG tablet Take 1 tablet (8 mg total) by mouth 2 (two) times daily as needed for nausea or vomiting. (Patient not taking: Reported on 06/04/2018) 40 tablet 1  . pentosan polysulfate (ELMIRON) 100 MG capsule Take 100 mg by mouth 3 (three) times daily.     Vladimir Faster Glycol-Propyl Glycol (SYSTANE OP) Apply 1 drop to eye 2 (two) times daily.    . prochlorperazine (COMPAZINE) 10 MG tablet TAKE 1 TABLET(10 MG) BY MOUTH EVERY 6 HOURS AS NEEDED FOR NAUSEA OR VOMITING 30 tablet 0   No current facility-administered medications for this visit.     REVIEW OF SYSTEMS:    A 10+ POINT REVIEW OF SYSTEMS WAS OBTAINED including neurology, dermatology, psychiatry, cardiac, respiratory, lymph, extremities, GI, GU, Musculoskeletal, constitutional, breasts, reproductive, HEENT.  All pertinent positives are noted in the HPI.  All others are negative.   PHYSICAL EXAMINATION: ECOG PERFORMANCE STATUS: 2 - Symptomatic, <50% confined to  bed  . Vitals:   06/18/18 1506  BP: 135/63  Pulse:  79  Resp: 18  Temp: 98.6 F (37 C)  SpO2: 99%   Filed Weights   06/18/18 1506  Weight: 158 lb 9.6 oz (71.9 kg)   .Body mass index is 28.09 kg/m.  GENERAL:alert, in no acute distress and comfortable SKIN: no acute rashes, no significant lesions EYES: conjunctiva are pink and non-injected, sclera anicteric OROPHARYNX: MMM, no exudates, no oropharyngeal erythema or ulceration NECK: supple, no JVD LYMPH: Palpable lymphadenopathy in the left cervical, 4-5cm palpable LN in right cervical, no palpable lymphadenopathy in the axillary or inguinal regions LUNGS: clear to auscultation b/l with normal respiratory effort HEART: regular rate & rhythm ABDOMEN:  normoactive bowel sounds , non tender, not distended. No palpable hepatosplenomegaly.  Extremity: no pedal edema PSYCH: alert & oriented x 3 with fluent speech NEURO: no focal motor/sensory deficits   LABORATORY DATA:  I have reviewed the data as listed  . CBC Latest Ref Rng & Units 06/18/2018 06/04/2018 05/22/2018  WBC 4.0 - 10.5 K/uL 6.6 6.3 6.1  Hemoglobin 12.0 - 15.0 g/dL 14.3 15.0 14.6  Hematocrit 36.0 - 46.0 % 42.0 44.7 43.9  Platelets 150 - 400 K/uL 167 206 200    . CMP Latest Ref Rng & Units 06/18/2018 06/04/2018 05/22/2018  Glucose 70 - 99 mg/dL 93 94 78  BUN 8 - 23 mg/dL 11 18 20   Creatinine 0.44 - 1.00 mg/dL 0.80 0.98 0.91  Sodium 135 - 145 mmol/L 141 141 142  Potassium 3.5 - 5.1 mmol/L 3.5 3.9 4.2  Chloride 98 - 111 mmol/L 108 109 107  CO2 22 - 32 mmol/L 23 22 26   Calcium 8.9 - 10.3 mg/dL 10.3 10.8(H) 10.5(H)  Total Protein 6.5 - 8.1 g/dL 7.0 7.3 7.1  Total Bilirubin 0.3 - 1.2 mg/dL 1.6(H) 1.4(H) 1.1  Alkaline Phos 38 - 126 U/L 102 92 79  AST 15 - 41 U/L 25 23 19   ALT 0 - 44 U/L 36 35 28    03/22/18 Flow Cytometry:   03/22/18 Biopsy:     RADIOGRAPHIC STUDIES: I have personally reviewed the radiological images as listed and agreed with the findings  in the report. No results found.  ASSESSMENT & PLAN:   78 y.o. female with  1. 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 2. RIGHT lymphadenitis seen with superimposed infection or pericapsular spread of tumor.    03/22/18 biopsy revealed follicular lymphoma, Grade 3a   04/04/18 Hep B and Hep C negative   04/17/18 PET/CT revealed Notable bilateral adenopathy in the neck and posterior nasopharynx is hypermetabolic and mostly Deauville 5 level activity, compatible with malignancy. 2. No pathologically enlarged or significantly hypermetabolic adenopathy in the chest, or abdomen/pelvis. No splenomegaly. Slight enlargement of the photopenic splenic cyst noted on remote prior Exams. 3. Other imaging findings of potential clinical significance: Aortic Atherosclerosis. Coronary atherosclerosis. Diffuse hepatic steatosis. Nonobstructive left nephrolithiasis.   2. Interstitial cystitis   PLAN:  -Discussed pt labwork today, 06/18/18; blood counts and chemistries are stable -The pt has no prohibitive toxicities from C1 BR, with dose reduced Bendamustine 70mg /m2 at this time.   -Intending treatment of Bendamustine and Rituxan for 4-6 cycles. Chose not to treat with R-CHOP given interstitial cystitis. -Continue follow up with Urology Dr. Alona Bene -Discussed that the intent of treatment is to achieve remission, is not curative -Will see the pt back in 2 weeks with C2 BR  RTC as per scheduled appointments on 07/04/2018 for labs , MD visit and treatment.   All of the patients questions were answered with apparent satisfaction. The patient knows to call the clinic with any problems, questions or concerns.  The total time spent in the appt was 25 minutes and more than 50% was on counseling and direct patient  cares.    Sullivan Lone MD MS AAHIVMS [facility] Hematology/Oncology Physician [facility]  (Office):       [phone] (Work cell):  [phone] (Fax):           [phone]  06/18/2018 4:17 PM  I, Baldwin [place], am acting as a scribe for Dr. Sullivan Lone.   .I have reviewed the above documentation for accuracy and completeness, and I agree with the above. [name] MD

## 2018-06-18 ENCOUNTER — Encounter: Payer: Self-pay | Admitting: *Deleted

## 2018-06-18 ENCOUNTER — Inpatient Hospital Stay (HOSPITAL_BASED_OUTPATIENT_CLINIC_OR_DEPARTMENT_OTHER): Payer: Medicare Other | Admitting: Hematology

## 2018-06-18 ENCOUNTER — Inpatient Hospital Stay: Payer: Medicare Other | Attending: Hematology

## 2018-06-18 VITALS — BP 135/63 | HR 79 | Temp 98.6°F | Resp 18 | Ht 63.0 in | Wt 158.6 lb

## 2018-06-18 DIAGNOSIS — N3010 Interstitial cystitis (chronic) without hematuria: Secondary | ICD-10-CM | POA: Diagnosis not present

## 2018-06-18 DIAGNOSIS — E785 Hyperlipidemia, unspecified: Secondary | ICD-10-CM | POA: Diagnosis not present

## 2018-06-18 DIAGNOSIS — Z79899 Other long term (current) drug therapy: Secondary | ICD-10-CM | POA: Diagnosis not present

## 2018-06-18 DIAGNOSIS — K219 Gastro-esophageal reflux disease without esophagitis: Secondary | ICD-10-CM | POA: Insufficient documentation

## 2018-06-18 DIAGNOSIS — Z7982 Long term (current) use of aspirin: Secondary | ICD-10-CM | POA: Diagnosis not present

## 2018-06-18 DIAGNOSIS — C8231 Follicular lymphoma grade IIIa, lymph nodes of head, face, and neck: Secondary | ICD-10-CM | POA: Diagnosis not present

## 2018-06-18 DIAGNOSIS — R0602 Shortness of breath: Secondary | ICD-10-CM | POA: Insufficient documentation

## 2018-06-18 DIAGNOSIS — M797 Fibromyalgia: Secondary | ICD-10-CM | POA: Insufficient documentation

## 2018-06-18 DIAGNOSIS — Z791 Long term (current) use of non-steroidal anti-inflammatories (NSAID): Secondary | ICD-10-CM | POA: Diagnosis not present

## 2018-06-18 DIAGNOSIS — R110 Nausea: Secondary | ICD-10-CM | POA: Insufficient documentation

## 2018-06-18 DIAGNOSIS — Z9012 Acquired absence of left breast and nipple: Secondary | ICD-10-CM | POA: Diagnosis not present

## 2018-06-18 DIAGNOSIS — Z9223 Personal history of estrogen therapy: Secondary | ICD-10-CM | POA: Insufficient documentation

## 2018-06-18 DIAGNOSIS — Z853 Personal history of malignant neoplasm of breast: Secondary | ICD-10-CM

## 2018-06-18 DIAGNOSIS — Z5111 Encounter for antineoplastic chemotherapy: Secondary | ICD-10-CM | POA: Diagnosis not present

## 2018-06-18 DIAGNOSIS — R7303 Prediabetes: Secondary | ICD-10-CM | POA: Diagnosis not present

## 2018-06-18 DIAGNOSIS — Z5112 Encounter for antineoplastic immunotherapy: Secondary | ICD-10-CM | POA: Insufficient documentation

## 2018-06-18 DIAGNOSIS — R61 Generalized hyperhidrosis: Secondary | ICD-10-CM

## 2018-06-18 DIAGNOSIS — I10 Essential (primary) hypertension: Secondary | ICD-10-CM

## 2018-06-18 DIAGNOSIS — Z86000 Personal history of in-situ neoplasm of breast: Secondary | ICD-10-CM | POA: Diagnosis not present

## 2018-06-18 DIAGNOSIS — I2510 Atherosclerotic heart disease of native coronary artery without angina pectoris: Secondary | ICD-10-CM | POA: Diagnosis not present

## 2018-06-18 DIAGNOSIS — C8238 Follicular lymphoma grade IIIa, lymph nodes of multiple sites: Secondary | ICD-10-CM

## 2018-06-18 LAB — CMP (CANCER CENTER ONLY)
ALT: 36 U/L (ref 0–44)
AST: 25 U/L (ref 15–41)
Albumin: 3.9 g/dL (ref 3.5–5.0)
Alkaline Phosphatase: 102 U/L (ref 38–126)
Anion gap: 10 (ref 5–15)
BUN: 11 mg/dL (ref 8–23)
CALCIUM: 10.3 mg/dL (ref 8.9–10.3)
CO2: 23 mmol/L (ref 22–32)
CREATININE: 0.8 mg/dL (ref 0.44–1.00)
Chloride: 108 mmol/L (ref 98–111)
GFR, Est AFR Am: >60 mL/min (ref >60)
GFR, Estimated: >60 mL/min (ref >60)
Glucose, Bld: 93 mg/dL (ref 70–99)
Potassium: 3.5 mmol/L (ref 3.5–5.1)
Sodium: 141 mmol/L (ref 135–145)
Total Bilirubin: 1.6 mg/dL — ABNORMAL HIGH (ref 0.3–1.2)
Total Protein: 7 g/dL (ref 6.5–8.1)

## 2018-06-18 LAB — CBC WITH DIFFERENTIAL/PLATELET
Abs Immature Granulocytes: 0.04 10*3/uL (ref 0.00–0.07)
Basophils Absolute: 0.1 10*3/uL (ref 0.0–0.1)
Basophils Relative: 1 %
Eosinophils Absolute: 0.2 10*3/uL (ref 0.0–0.5)
Eosinophils Relative: 3 %
HEMATOCRIT: 42 % (ref 36.0–46.0)
Hemoglobin: 14.3 g/dL (ref 12.0–15.0)
Immature Granulocytes: 1 %
Lymphocytes Relative: 8 %
Lymphs Abs: 0.6 10*3/uL — ABNORMAL LOW (ref 0.7–4.0)
MCH: 28.9 pg (ref 26.0–34.0)
MCHC: 34 g/dL (ref 30.0–36.0)
MCV: 84.8 fL (ref 80.0–100.0)
MONO ABS: 0.9 10*3/uL (ref 0.1–1.0)
MONOS PCT: 14 %
Neutro Abs: 4.8 10*3/uL (ref 1.7–7.7)
Neutrophils Relative %: 73 %
Platelets: 167 10*3/uL (ref 150–400)
RBC: 4.95 MIL/uL (ref 3.87–5.11)
RDW: 13.2 % (ref 11.5–15.5)
WBC: 6.6 10*3/uL (ref 4.0–10.5)
nRBC: 0 % (ref 0.0–0.2)

## 2018-06-18 LAB — MAGNESIUM: Magnesium: 1.8 mg/dL (ref 1.7–2.4)

## 2018-06-18 NOTE — Patient Instructions (Signed)
Thank you for choosing [facility] to provide your oncology and hematology care.  To afford each patient quality time with our providers, please arrive 30 minutes before your scheduled appointment time.  If you arrive late for your appointment, you may be asked to reschedule.  We strive to give you quality time with our providers, and arriving late affects you and other patients whose appointments are after yours.    If you are a no show for multiple scheduled visits, you may be dismissed from the clinic at the providers discretion.     Again, thank you for choosing [facility], our hope is that these requests will decrease the amount of time that you wait before being seen by our physicians.  ______________________________________________________________________   Should you have questions after your visit to the [facility], please contact our office at [phone] between the hours of 8:30 and 4:30 p.m.    Voicemails left after 4:30p.m will not be returned until the following business day.     For prescription refill requests, please have your pharmacy contact us directly.  Please also try to allow 48 hours for prescription requests.     Please contact the scheduling department for questions regarding scheduling.  For scheduling of procedures such as PET scans, CT scans, MRI, Ultrasound, etc please contact central scheduling at [phone].     Resources For Cancer Patients and Caregivers:    Oncolink.org:  A wonderful resource for patients and healthcare providers for information regarding your disease, ways to tract your treatment, what to expect, etc.      American Cancer Society:  800-227-2345  Can help patients locate various types of support and financial assistance   Cancer Care: 1-800-813-HOPE (4673) Provides financial assistance, online support groups, medication/co-pay assistance.     [place] DSS:  336-641-3447 Where to apply  for food stamps, Medicaid, and utility assistance   Medicare Rights Center: 800-333-4114 Helps people with Medicare understand their rights and benefits, navigate the Medicare system, and secure the quality healthcare they deserve   SCAT: 336-333-6589 [employer] [place] [employer]'s shared-ride transportation service for eligible riders who have a disability that prevents them from riding the fixed route bus.     For additional information on assistance programs please contact our social worker:   Abigail Elmore:  [phone]  

## 2018-06-19 ENCOUNTER — Telehealth: Payer: Self-pay

## 2018-06-19 NOTE — Telephone Encounter (Signed)
Printed calender and will mail addition appointment. Per 1/7 los

## 2018-06-23 ENCOUNTER — Other Ambulatory Visit: Payer: Self-pay | Admitting: Hematology & Oncology

## 2018-06-24 ENCOUNTER — Encounter: Payer: Self-pay | Admitting: *Deleted

## 2018-06-24 NOTE — Progress Notes (Signed)
A user error has taken place: encounter opened in error, closed for administrative reasons.

## 2018-06-24 NOTE — Progress Notes (Addendum)
Oncology Nurse Navigator Documentation  To provide support, encouragement and care continuity, met with Ms. Reffner and her friend [name] during [place]. Pt. appt with Dr. Irene Limbo. She reported:  No further nausea issues, only needed to take single dose of new Rx Compazine.  Has appt with urologist Dr. [name] 1/14, again 2 wks after 1/23 2nd cycle infusion.  She voiced understanding scan to be conducted s/p 3rd cycle. I encouraged her to call me with needs/concerns as they arise.  She voiced agreement.  [name], RN, BSN Head & Neck Oncology Nurse [place] [place] [phone]

## 2018-06-25 DIAGNOSIS — Z87442 Personal history of urinary calculi: Secondary | ICD-10-CM | POA: Diagnosis not present

## 2018-06-25 DIAGNOSIS — C8511 Unspecified B-cell lymphoma, lymph nodes of head, face, and neck: Secondary | ICD-10-CM | POA: Diagnosis not present

## 2018-06-25 DIAGNOSIS — N200 Calculus of kidney: Secondary | ICD-10-CM | POA: Diagnosis not present

## 2018-06-25 DIAGNOSIS — N261 Atrophy of kidney (terminal): Secondary | ICD-10-CM | POA: Diagnosis not present

## 2018-06-25 DIAGNOSIS — N3010 Interstitial cystitis (chronic) without hematuria: Secondary | ICD-10-CM | POA: Diagnosis not present

## 2018-06-28 ENCOUNTER — Other Ambulatory Visit: Payer: Self-pay | Admitting: Hematology & Oncology

## 2018-07-03 NOTE — Progress Notes (Signed)
HEMATOLOGY/ONCOLOGY CONSULTATION NOTE  Date of Service: 07/04/2018  Patient Care Team: [name], MD as PCP - General (Internal Medicine) Stanford Breed [name], MD as PCP - Cardiology (Cardiology) [name], MD as Consulting Physician (Hematology) [name], RN as Oncology Nurse Navigator (Oncology)  CHIEF COMPLAINTS/PURPOSE OF CONSULTATION:  Follicular lymphoma grade 3a  HISTORY OF PRESENTING ILLNESS:   Rachael Greer is a wonderful 78 y.o. female who has been referred to [name] by Dr. 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 She is accompanied today by a friend, and our Nurse Navigator [name]. The pt reports that she is doing well overall.   The pt reports that things began changing last September in 2018. She notes that she has taken Flonase, Claritin, and saline solution and did not have a runny nose, and did not have any breathing difficulty. She notes that her head felt as though she had a flu or a cold, without any other symptoms. She notes that she felt as if her ears were swollen and has had some minimal hearing impairment, and has myringotomy tubes. She began a steroid course about 4 months ago with Dr. [name]. She notes that she first noticed right sided neck swelling in the last month, and left sided neck swelling which began 2-3 weeks ago.   She obtained a CT Neck on 03/08/18, as noted below.   The pt notes that she has not had fevers or chills, but has woken up recently with night sweats occasionally over the last 6 months. She adds that she has noticed some SOB in the last 7-8 months which presents intermittently, and has not progressed. The pt notes that she has had some difficulty swallowing in the past, and saw Dr. [name] for concerns of a stricture, and adds that this she has had recent difficulty swallowing. The pt also notes that she has recently felt dizziness, described as the room spinning. She has not had her dizziness worked  up.  The pt notes that she had a mastectomy without radiation or chemotherapy for breast cancer in situ in 2013. She began tamoxifen preventatively afterwards for 2.5 years.   The pt notes that her interstitial cystitis cause is unknown. She notes that she took a TB vaccine 5-7 years ago, and developed severe pain and sepsis. She notes that she has not had recurrent UTIs and takes Elmiron under the care of Dr. [name] in urology. She notes that there are certain foods that she cannot eat without developing bladder pain. She also denies blood in the urine.  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 2. RIGHT lymphadenitis seen with superimposed infection or pericapsular spread of tumor.  Most recent lab results (03/08/18) of CBC w/diff and BMP is as follows: all values are WNL except for RBC at 5.22, HGB at 15.3.  On review of systems, pt reports some night sweats, some SOB, right neck swelling, left neck swelling, dizziness, hearing impairment, healed incision, right ear numbness, and denies fevers, chills, runny nose, abdominal pains, abdominal distension, blood in the urine, sinus pressure or pain, pain along the spine, leg swelling, skin rashes, and any other symptoms.   On PMHx the pt reports breast cancer in 2013, sternocleidomastoid release in childhood, interstitial cystitis, sepsis. On Social Hx the pt denies ever  smoking cigarettes and denies ever consuming alcohol  Interval History:  Rachael Greer returns today for management and evaluation of her Grade 3a Follicular Lymphoma, and prior to C2 BR. The patient's last visit with [name] was on 06/18/18. She is accompanied today by her friend. The pt reports that she is doing well overall.  The pt reports that she  has had some recent nausea, and denies sensing this is anticipatory of chemotherapy. The pt notes that Pepcid seems to "calm it down." She denies any fevers or concerns for infections. The pt notes that her voice still sounds different to her, as if her voice is an echo. She notes that she is still unable to hear quite as well in her right ear. The pt does note that the previous "tingle in her ear is lessening." She endorses stable energy levels.   Lab results today (07/04/18) of CBC w/diff and CMP is as follows: all values are WNL except for PLT at 144k, Lymphs abs at 600, Total Bilirubin at 1.8.  On review of systems, pt reports controlled nausea, stable energy levels, and denies new urinary symptoms, abdominal pains, leg swelling, discomfort passing urine, fevers, chills, concerns for infections, and any other symptoms.  MEDICAL HISTORY:  Past Medical History:  Diagnosis Date  . Anxiety   . Breast cancer ([place])   . Chest pain   . Depression   . DOE (dyspnea on exertion)   . Eustachian tube obstruction, bilateral   . Fibromyalgia   . Gastric reflux   . History of kidney stones   . Hyperlipidemia   . Hypertension   . IBS (irritable bowel syndrome)   . Interstitial cystitis   . Nephrolithiasis   . Nephrolithiasis   . Palpitations   . Pre-diabetes   . Sepsis ([place])     SURGICAL HISTORY: Past Surgical History:  Procedure Laterality Date  . BREAST DUCTAL SYSTEM EXCISION Right 10/30/2016   Procedure: RIGHT BREAST DUCT EXCISION;  Surgeon: Rolm Bookbinder, MD;  Location: [facility];  Service: General;  Laterality: Right;  . EYE SURGERY     bilateral lense placements  . MASTECTOMY Left   . NECK SURGERY     muscles cut in her neck  . NEPHROSTOMY    . TONSILLECTOMY      SOCIAL HISTORY: Social History   Socioeconomic History  . Marital status: Widowed    Spouse name: Not on file  . Number of children: 4  . Years of education: Not on file  . Highest education level: Not on file   Occupational History  . Not on file  Social Needs  . Financial resource strain: Not on file  . Food insecurity:    Worry: Not on file    Inability: Not on file  . Transportation needs:    Medical: Not on file    Non-medical: Not on file  Tobacco Use  . Smoking status: Never Smoker  . Smokeless tobacco: Never Used  Substance and Sexual Activity  . Alcohol use: No  . Drug use: No  . Sexual activity: Not on file  Lifestyle  . Physical activity:    Days per week: Not on file    Minutes per session: Not on file  . Stress: Not on file  Relationships  . Social connections:    Talks on phone: Not on file    Gets together: Not on file    Attends religious service: Not on file    Active member of club or  organization: Not on file    Attends meetings of clubs or organizations: Not on file    Relationship status: Not on file  . Intimate partner violence:    Fear of current or ex partner: Not on file    Emotionally abused: Not on file    Physically abused: Not on file    Forced sexual activity: Not on file  Other Topics Concern  . Not on file  Social History Narrative  . Not on file    FAMILY HISTORY: Family History  Problem Relation Age of Onset  . CAD Father   . Heart attack Father   . Dementia Mother     ALLERGIES:  is allergic to amoxicillin and no known allergies.  MEDICATIONS:  Current Outpatient Medications  Medication Sig Dispense Refill  . amLODipine (NORVASC) 2.5 MG tablet Take 2.5 mg by mouth daily.    Marland Kitchen aspirin EC 81 MG tablet Take 1 tablet (81 mg total) by mouth daily. 90 tablet 3  . atorvastatin (LIPITOR) 20 MG tablet Take 1 tablet (20 mg total) by mouth daily at 6 PM. 90 tablet 3  . clonazePAM (KLONOPIN) 1 MG tablet Take 1 mg by mouth 2 (two) times daily as needed for anxiety.     . Ibuprofen-Diphenhydramine HCl (ADVIL PM) 200-25 MG CAPS Take 0.5-1 tablets by mouth daily as needed (sleep).     . metoprolol succinate (TOPROL-XL) 25 MG 24 hr tablet Take 25  mg by mouth every evening.    Marland Kitchen omeprazole (PRILOSEC) 40 MG capsule Take 40 mg by mouth daily.    . ondansetron (ZOFRAN) 8 MG tablet Take 1 tablet (8 mg total) by mouth 2 (two) times daily as needed for nausea or vomiting. 40 tablet 1  . pentosan polysulfate (ELMIRON) 100 MG capsule Take 100 mg by mouth 3 (three) times daily.     Vladimir Faster Glycol-Propyl Glycol (SYSTANE OP) Apply 1 drop to eye 2 (two) times daily.    . prochlorperazine (COMPAZINE) 10 MG tablet TAKE 1 TABLET(10 MG) BY MOUTH EVERY 6 HOURS AS NEEDED FOR NAUSEA OR VOMITING 30 tablet 0   No current facility-administered medications for this visit.     REVIEW OF SYSTEMS:    A 10+ POINT REVIEW OF SYSTEMS WAS OBTAINED including neurology, dermatology, psychiatry, cardiac, respiratory, lymph, extremities, GI, GU, Musculoskeletal, constitutional, breasts, reproductive, HEENT.  All pertinent positives are noted in the HPI.  All others are negative.   PHYSICAL EXAMINATION: ECOG PERFORMANCE STATUS: 2 - Symptomatic, <50% confined to bed  Vitals:   07/04/18 1008  BP: 130/64  Pulse: 71  Resp: 18  Temp: 97.7 F (36.5 C)  SpO2: 100%   Filed Weights   07/04/18 1008  Weight: 157 lb 12.8 oz (71.6 kg)   .Body mass index is 27.95 kg/m.  GENERAL:alert, in no acute distress and comfortable SKIN: no acute rashes, no significant lesions EYES: conjunctiva are pink and non-injected, sclera anicteric OROPHARYNX: MMM, no exudates, no oropharyngeal erythema or ulceration NECK: supple, no JVD LYMPH: Palpable lymphadenopathy in the left cervical, 4-5cm palpable LN in right cervical, no palpable lymphadenopathy in the axillary or inguinal regions LUNGS: clear to auscultation b/l with normal respiratory effort HEART: regular rate & rhythm ABDOMEN:  normoactive bowel sounds , non tender, not distended. No palpable hepatosplenomegaly.  Extremity: no pedal edema PSYCH: alert & oriented x 3 with fluent speech NEURO: no focal motor/sensory  deficits   LABORATORY DATA:  I have reviewed the data as listed  .  CBC Latest Ref Rng & Units 07/04/2018 06/18/2018 06/04/2018  WBC 4.0 - 10.5 K/uL 4.5 6.6 6.3  Hemoglobin 12.0 - 15.0 g/dL 14.2 14.3 15.0  Hematocrit 36.0 - 46.0 % 42.0 42.0 44.7  Platelets 150 - 400 K/uL 144(L) 167 206    . CMP Latest Ref Rng & Units 07/04/2018 06/18/2018 06/04/2018  Glucose 70 - 99 mg/dL 93 93 94  BUN 8 - 23 mg/dL 14 11 18   Creatinine 0.44 - 1.00 mg/dL 0.77 0.80 0.98  Sodium 135 - 145 mmol/L 142 141 141  Potassium 3.5 - 5.1 mmol/L 3.8 3.5 3.9  Chloride 98 - 111 mmol/L 110 108 109  CO2 22 - 32 mmol/L 22 23 22   Calcium 8.9 - 10.3 mg/dL 10.3 10.3 10.8(H)  Total Protein 6.5 - 8.1 g/dL 6.8 7.0 7.3  Total Bilirubin 0.3 - 1.2 mg/dL 1.8(H) 1.6(H) 1.4(H)  Alkaline Phos 38 - 126 U/L 99 102 92  AST 15 - 41 U/L 21 25 23   ALT 0 - 44 U/L 31 36 35    03/22/18 Flow Cytometry:   03/22/18 Biopsy:     RADIOGRAPHIC STUDIES: I have personally reviewed the radiological images as listed and agreed with the findings in the report. No results found.  ASSESSMENT & PLAN:   78 y.o. female with  1. 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 2. RIGHT lymphadenitis seen with superimposed infection or pericapsular spread of tumor.    03/22/18 biopsy revealed follicular lymphoma, Grade 3a   04/04/18 Hep B and Hep C negative   04/17/18 PET/CT revealed Notable bilateral adenopathy in the neck and posterior nasopharynx is hypermetabolic and mostly Deauville 5 level activity, compatible with malignancy. 2. No pathologically enlarged or significantly hypermetabolic adenopathy in the chest, or abdomen/pelvis. No splenomegaly. Slight enlargement of the photopenic splenic cyst noted on remote prior Exams. 3. Other imaging findings of  potential clinical significance: Aortic Atherosclerosis. Coronary atherosclerosis. Diffuse hepatic steatosis. Nonobstructive left nephrolithiasis.   2. Interstitial cystitis   PLAN:  -Discussed pt labwork today, 07/04/18; blood counts and chemistries are stable  -The pt has no prohibitive toxicities from continuing C2 BR with dose reduced Bendamustine 70mg /m2 at this time. -Will repeat PET/CT before C4 -Intending treatment of Bendamustine and Rituxan for 4-6 cycles. Chose not to treat with R-CHOP given interstitial cystitis. -Continue follow up with Urology Dr. Alona Bene -Discussed that the intent of treatment is to achieve remission, is not curative   Please schedule next cycle of chemotherapy BR as per orders in 4 weeks with labs RTC with Dr Irene Limbo in 4 weeks with labs   All of the patients questions were answered with apparent satisfaction. The patient knows to call the clinic with any problems, questions or concerns.  The total time spent in the appt was 25 minutes and more than 50% was on counseling and direct patient cares.    Sullivan Lone MD MS AAHIVMS [facility] Hematology/Oncology Physician [facility]  (Office):       [phone] (Work cell):  [phone] (Fax):           [phone]  07/04/2018 10:40 AM  I, Baldwin Jamaica, am acting as a scribe for Dr. Sullivan Lone.   .I have reviewed the above documentation for accuracy and completeness, and I agree with the above. [name] MD

## 2018-07-04 ENCOUNTER — Inpatient Hospital Stay: Payer: Medicare Other

## 2018-07-04 ENCOUNTER — Inpatient Hospital Stay: Payer: Medicare Other | Admitting: Hematology

## 2018-07-04 ENCOUNTER — Other Ambulatory Visit: Payer: Self-pay | Admitting: Hematology

## 2018-07-04 VITALS — BP 105/65 | HR 71 | Temp 98.2°F | Resp 16

## 2018-07-04 VITALS — BP 130/64 | HR 71 | Temp 97.7°F | Resp 18 | Ht 63.0 in | Wt 157.8 lb

## 2018-07-04 DIAGNOSIS — M797 Fibromyalgia: Secondary | ICD-10-CM

## 2018-07-04 DIAGNOSIS — Z86000 Personal history of in-situ neoplasm of breast: Secondary | ICD-10-CM

## 2018-07-04 DIAGNOSIS — Z9223 Personal history of estrogen therapy: Secondary | ICD-10-CM

## 2018-07-04 DIAGNOSIS — C8231 Follicular lymphoma grade IIIa, lymph nodes of head, face, and neck: Secondary | ICD-10-CM | POA: Diagnosis not present

## 2018-07-04 DIAGNOSIS — Z79899 Other long term (current) drug therapy: Secondary | ICD-10-CM | POA: Diagnosis not present

## 2018-07-04 DIAGNOSIS — R7303 Prediabetes: Secondary | ICD-10-CM | POA: Diagnosis not present

## 2018-07-04 DIAGNOSIS — R61 Generalized hyperhidrosis: Secondary | ICD-10-CM | POA: Diagnosis not present

## 2018-07-04 DIAGNOSIS — I2510 Atherosclerotic heart disease of native coronary artery without angina pectoris: Secondary | ICD-10-CM

## 2018-07-04 DIAGNOSIS — Z5112 Encounter for antineoplastic immunotherapy: Secondary | ICD-10-CM | POA: Diagnosis not present

## 2018-07-04 DIAGNOSIS — Z7982 Long term (current) use of aspirin: Secondary | ICD-10-CM | POA: Diagnosis not present

## 2018-07-04 DIAGNOSIS — Z791 Long term (current) use of non-steroidal anti-inflammatories (NSAID): Secondary | ICD-10-CM | POA: Diagnosis not present

## 2018-07-04 DIAGNOSIS — Z5111 Encounter for antineoplastic chemotherapy: Secondary | ICD-10-CM | POA: Diagnosis not present

## 2018-07-04 DIAGNOSIS — Z9012 Acquired absence of left breast and nipple: Secondary | ICD-10-CM | POA: Diagnosis not present

## 2018-07-04 DIAGNOSIS — N3010 Interstitial cystitis (chronic) without hematuria: Secondary | ICD-10-CM

## 2018-07-04 DIAGNOSIS — I10 Essential (primary) hypertension: Secondary | ICD-10-CM | POA: Diagnosis not present

## 2018-07-04 DIAGNOSIS — R110 Nausea: Secondary | ICD-10-CM | POA: Diagnosis not present

## 2018-07-04 DIAGNOSIS — C8238 Follicular lymphoma grade IIIa, lymph nodes of multiple sites: Secondary | ICD-10-CM | POA: Diagnosis not present

## 2018-07-04 DIAGNOSIS — K219 Gastro-esophageal reflux disease without esophagitis: Secondary | ICD-10-CM

## 2018-07-04 DIAGNOSIS — Z7189 Other specified counseling: Secondary | ICD-10-CM

## 2018-07-04 DIAGNOSIS — E785 Hyperlipidemia, unspecified: Secondary | ICD-10-CM | POA: Diagnosis not present

## 2018-07-04 DIAGNOSIS — R0602 Shortness of breath: Secondary | ICD-10-CM | POA: Diagnosis not present

## 2018-07-04 LAB — CMP (CANCER CENTER ONLY)
ALT: 31 U/L (ref 0–44)
ANION GAP: 10 (ref 5–15)
AST: 21 U/L (ref 15–41)
Albumin: 3.8 g/dL (ref 3.5–5.0)
Alkaline Phosphatase: 99 U/L (ref 38–126)
BUN: 14 mg/dL (ref 8–23)
CO2: 22 mmol/L (ref 22–32)
Calcium: 10.3 mg/dL (ref 8.9–10.3)
Chloride: 110 mmol/L (ref 98–111)
Creatinine: 0.77 mg/dL (ref 0.44–1.00)
GFR, Estimated: >60 mL/min (ref >60)
Glucose, Bld: 93 mg/dL (ref 70–99)
Potassium: 3.8 mmol/L (ref 3.5–5.1)
Sodium: 142 mmol/L (ref 135–145)
Total Bilirubin: 1.8 mg/dL — ABNORMAL HIGH (ref 0.3–1.2)
Total Protein: 6.8 g/dL (ref 6.5–8.1)

## 2018-07-04 LAB — CBC WITH DIFFERENTIAL/PLATELET
Abs Immature Granulocytes: 0.03 10*3/uL (ref 0.00–0.07)
Basophils Absolute: 0.1 10*3/uL (ref 0.0–0.1)
Basophils Relative: 1 %
EOS PCT: 8 %
Eosinophils Absolute: 0.4 10*3/uL (ref 0.0–0.5)
HCT: 42 % (ref 36.0–46.0)
Hemoglobin: 14.2 g/dL (ref 12.0–15.0)
Immature Granulocytes: 1 %
Lymphocytes Relative: 14 %
Lymphs Abs: 0.6 10*3/uL — ABNORMAL LOW (ref 0.7–4.0)
MCH: 28.8 pg (ref 26.0–34.0)
MCHC: 33.8 g/dL (ref 30.0–36.0)
MCV: 85.2 fL (ref 80.0–100.0)
Monocytes Absolute: 0.6 10*3/uL (ref 0.1–1.0)
Monocytes Relative: 13 %
Neutro Abs: 2.8 10*3/uL (ref 1.7–7.7)
Neutrophils Relative %: 63 %
Platelets: 144 10*3/uL — ABNORMAL LOW (ref 150–400)
RBC: 4.93 MIL/uL (ref 3.87–5.11)
RDW: 14.2 % (ref 11.5–15.5)
WBC: 4.5 10*3/uL (ref 4.0–10.5)
nRBC: 0 % (ref 0.0–0.2)

## 2018-07-04 MED ORDER — PALONOSETRON HCL INJECTION 0.25 MG/5ML
0.2500 mg | Freq: Once | INTRAVENOUS | Status: AC
  Administered 2018-07-04: 0.25 mg via INTRAVENOUS

## 2018-07-04 MED ORDER — ACETAMINOPHEN 325 MG PO TABS
650.0000 mg | ORAL_TABLET | Freq: Once | ORAL | Status: AC
  Administered 2018-07-04: 650 mg via ORAL

## 2018-07-04 MED ORDER — ACETAMINOPHEN 325 MG PO TABS
ORAL_TABLET | ORAL | Status: AC
  Filled 2018-07-04: qty 2

## 2018-07-04 MED ORDER — SODIUM CHLORIDE 0.9 % IV SOLN
Freq: Once | INTRAVENOUS | Status: AC
  Administered 2018-07-04: 12:00:00 via INTRAVENOUS
  Filled 2018-07-04: qty 250

## 2018-07-04 MED ORDER — SODIUM CHLORIDE 0.9 % IV SOLN
70.0000 mg/m2 | Freq: Once | INTRAVENOUS | Status: AC
  Administered 2018-07-04: 125 mg via INTRAVENOUS
  Filled 2018-07-04: qty 5

## 2018-07-04 MED ORDER — SODIUM CHLORIDE 0.9 % IV SOLN
375.0000 mg/m2 | Freq: Once | INTRAVENOUS | Status: AC
  Administered 2018-07-04: 700 mg via INTRAVENOUS
  Filled 2018-07-04: qty 20

## 2018-07-04 MED ORDER — DIPHENHYDRAMINE HCL 25 MG PO CAPS
50.0000 mg | ORAL_CAPSULE | Freq: Once | ORAL | Status: AC
  Administered 2018-07-04: 50 mg via ORAL

## 2018-07-04 MED ORDER — DEXAMETHASONE SODIUM PHOSPHATE 10 MG/ML IJ SOLN
10.0000 mg | Freq: Once | INTRAMUSCULAR | Status: AC
  Administered 2018-07-04: 10 mg via INTRAVENOUS

## 2018-07-04 MED ORDER — DEXAMETHASONE SODIUM PHOSPHATE 10 MG/ML IJ SOLN
INTRAMUSCULAR | Status: AC
  Filled 2018-07-04: qty 1

## 2018-07-04 MED ORDER — PALONOSETRON HCL INJECTION 0.25 MG/5ML
INTRAVENOUS | Status: AC
  Filled 2018-07-04: qty 5

## 2018-07-04 MED ORDER — DIPHENHYDRAMINE HCL 25 MG PO CAPS
ORAL_CAPSULE | ORAL | Status: AC
  Filled 2018-07-04: qty 2

## 2018-07-04 NOTE — Patient Instructions (Signed)
[facility] Discharge Instructions for Patients Receiving Chemotherapy  Today you received the following chemotherapy agents Rituxan; Bendamustine.   To help prevent nausea and vomiting after your treatment, we encourage you to take your nausea medication as prescribed.   If you develop nausea and vomiting that is not controlled by your nausea medication, call the clinic.   BELOW ARE SYMPTOMS THAT SHOULD BE REPORTED IMMEDIATELY:  *FEVER GREATER THAN 100.5 F  *CHILLS WITH OR WITHOUT FEVER  NAUSEA AND VOMITING THAT IS NOT CONTROLLED WITH YOUR NAUSEA MEDICATION  *UNUSUAL SHORTNESS OF BREATH  *UNUSUAL BRUISING OR BLEEDING  TENDERNESS IN MOUTH AND THROAT WITH OR WITHOUT PRESENCE OF ULCERS  *URINARY PROBLEMS  *BOWEL PROBLEMS  UNUSUAL RASH Items with * indicate a potential emergency and should be followed up as soon as possible.  Feel free to call the clinic should you have any questions or concerns. The clinic phone number is [phone].  Please show the [facility] at check-in to the Emergency Department and triage nurse.

## 2018-07-04 NOTE — Progress Notes (Signed)
Per Dr. Irene Limbo: Ok to treat today with Bili of 1.8

## 2018-07-04 NOTE — Progress Notes (Unsigned)
Cbc

## 2018-07-04 NOTE — Patient Instructions (Signed)
Thank you for choosing [facility] to provide your oncology and hematology care.  To afford each patient quality time with our providers, please arrive 30 minutes before your scheduled appointment time.  If you arrive late for your appointment, you may be asked to reschedule.  We strive to give you quality time with our providers, and arriving late affects you and other patients whose appointments are after yours.    If you are a no show for multiple scheduled visits, you may be dismissed from the clinic at the providers discretion.     Again, thank you for choosing [facility], our hope is that these requests will decrease the amount of time that you wait before being seen by our physicians.  ______________________________________________________________________   Should you have questions after your visit to the [facility], please contact our office at [phone] between the hours of 8:30 and 4:30 p.m.    Voicemails left after 4:30p.m will not be returned until the following business day.     For prescription refill requests, please have your pharmacy contact us directly.  Please also try to allow 48 hours for prescription requests.     Please contact the scheduling department for questions regarding scheduling.  For scheduling of procedures such as PET scans, CT scans, MRI, Ultrasound, etc please contact central scheduling at [phone].     Resources For Cancer Patients and Caregivers:    Oncolink.org:  A wonderful resource for patients and healthcare providers for information regarding your disease, ways to tract your treatment, what to expect, etc.      [place]:  ([phone]  Can help patients locate various types of support and financial assistance   Cancer Care: 1-800-813-HOPE 403 189 5559) Provides financial assistance, online support groups, medication/co-pay assistance.     [place]:  817-643-7465 Where to apply  for food stamps, Medicaid, and utility assistance   Medicare Rights Center: 317 055 7817 Helps people with Medicare understand their rights and benefits, navigate the Medicare system, and secure the quality healthcare they deserve   SCAT: [place] [employer]'s shared-ride transportation service for eligible riders who have a disability that prevents them from riding the fixed route bus.     For additional information on assistance programs please contact our social worker:   Johnnye Lana:  [phone] you for choosing [facility] to provide your oncology and hematology care.  To afford each patient quality time with our providers, please arrive 30 minutes before your scheduled appointment time.  If you arrive late for your appointment, you may be asked to reschedule.  We strive to give you quality time with our providers, and arriving late affects you and other patients whose appointments are after yours.    If you are a no show for multiple scheduled visits, you may be dismissed from the clinic at the providers discretion.     Again, thank you for choosing [facility], our hope is that these requests will decrease the amount of time that you wait before being seen by our physicians.  ______________________________________________________________________   Should you have questions after your visit to the [facility], please contact our office at [phone] between the hours of 8:30 and 4:30 p.m.    Voicemails left after 4:30p.m will not be returned until the following business day.     For prescription refill requests, please have your pharmacy contact us directly.  Please also try to  allow 48 hours for prescription requests.     Please contact the scheduling department for questions regarding scheduling.  For scheduling of procedures such as PET scans, CT scans, MRI, Ultrasound, etc please contact central scheduling at  [phone].     Resources For Cancer Patients and Caregivers:    Oncolink.org:  A wonderful resource for patients and healthcare providers for information regarding your disease, ways to tract your treatment, what to expect, etc.      [place]:  612-593-1948  Can help patients locate various types of support and financial assistance   Cancer Care: 1-800-813-HOPE (906) 666-9330) Provides financial assistance, online support groups, medication/co-pay assistance.     [place]:  435-309-6268 Where to apply for food stamps, Medicaid, and utility assistance   Medicare Rights Center: 207-155-9813 Helps people with Medicare understand their rights and benefits, navigate the Medicare system, and secure the quality healthcare they deserve   SCAT: [place] [employer]'s shared-ride transportation service for eligible riders who have a disability that prevents them from riding the fixed route bus.     For additional information on assistance programs please contact our social worker:   Johnnye Lana:  [phone]

## 2018-07-05 ENCOUNTER — Telehealth: Payer: Self-pay | Admitting: *Deleted

## 2018-07-05 ENCOUNTER — Inpatient Hospital Stay: Payer: Medicare Other

## 2018-07-05 VITALS — BP 128/49 | HR 78 | Temp 97.7°F | Resp 16

## 2018-07-05 DIAGNOSIS — N3010 Interstitial cystitis (chronic) without hematuria: Secondary | ICD-10-CM | POA: Diagnosis not present

## 2018-07-05 DIAGNOSIS — R7303 Prediabetes: Secondary | ICD-10-CM | POA: Diagnosis not present

## 2018-07-05 DIAGNOSIS — Z5112 Encounter for antineoplastic immunotherapy: Secondary | ICD-10-CM | POA: Diagnosis not present

## 2018-07-05 DIAGNOSIS — C8238 Follicular lymphoma grade IIIa, lymph nodes of multiple sites: Secondary | ICD-10-CM

## 2018-07-05 DIAGNOSIS — I10 Essential (primary) hypertension: Secondary | ICD-10-CM | POA: Diagnosis not present

## 2018-07-05 DIAGNOSIS — Z5111 Encounter for antineoplastic chemotherapy: Secondary | ICD-10-CM | POA: Diagnosis not present

## 2018-07-05 DIAGNOSIS — E785 Hyperlipidemia, unspecified: Secondary | ICD-10-CM | POA: Diagnosis not present

## 2018-07-05 DIAGNOSIS — R61 Generalized hyperhidrosis: Secondary | ICD-10-CM | POA: Diagnosis not present

## 2018-07-05 DIAGNOSIS — K219 Gastro-esophageal reflux disease without esophagitis: Secondary | ICD-10-CM | POA: Diagnosis not present

## 2018-07-05 DIAGNOSIS — M797 Fibromyalgia: Secondary | ICD-10-CM | POA: Diagnosis not present

## 2018-07-05 DIAGNOSIS — Z9012 Acquired absence of left breast and nipple: Secondary | ICD-10-CM | POA: Diagnosis not present

## 2018-07-05 DIAGNOSIS — Z791 Long term (current) use of non-steroidal anti-inflammatories (NSAID): Secondary | ICD-10-CM | POA: Diagnosis not present

## 2018-07-05 DIAGNOSIS — R0602 Shortness of breath: Secondary | ICD-10-CM | POA: Diagnosis not present

## 2018-07-05 DIAGNOSIS — Z79899 Other long term (current) drug therapy: Secondary | ICD-10-CM | POA: Diagnosis not present

## 2018-07-05 DIAGNOSIS — C8231 Follicular lymphoma grade IIIa, lymph nodes of head, face, and neck: Secondary | ICD-10-CM | POA: Diagnosis not present

## 2018-07-05 DIAGNOSIS — Z86000 Personal history of in-situ neoplasm of breast: Secondary | ICD-10-CM | POA: Diagnosis not present

## 2018-07-05 DIAGNOSIS — I2510 Atherosclerotic heart disease of native coronary artery without angina pectoris: Secondary | ICD-10-CM | POA: Diagnosis not present

## 2018-07-05 DIAGNOSIS — Z7982 Long term (current) use of aspirin: Secondary | ICD-10-CM | POA: Diagnosis not present

## 2018-07-05 DIAGNOSIS — R110 Nausea: Secondary | ICD-10-CM | POA: Diagnosis not present

## 2018-07-05 DIAGNOSIS — Z7189 Other specified counseling: Secondary | ICD-10-CM

## 2018-07-05 MED ORDER — SODIUM CHLORIDE 0.9 % IV SOLN
10.0000 mg | Freq: Once | INTRAVENOUS | Status: AC
  Administered 2018-07-05: 10 mg via INTRAVENOUS
  Filled 2018-07-05: qty 1

## 2018-07-05 MED ORDER — SODIUM CHLORIDE 0.9 % IV SOLN
Freq: Once | INTRAVENOUS | Status: AC
  Administered 2018-07-05: 12:00:00 via INTRAVENOUS
  Filled 2018-07-05: qty 250

## 2018-07-05 MED ORDER — DEXAMETHASONE SODIUM PHOSPHATE 10 MG/ML IJ SOLN: 10.0000 mg | Freq: Once | INTRAMUSCULAR | Status: DC

## 2018-07-05 MED ORDER — SODIUM CHLORIDE 0.9 % IV SOLN
70.0000 mg/m2 | Freq: Once | INTRAVENOUS | Status: AC
  Administered 2018-07-05: 125 mg via INTRAVENOUS
  Filled 2018-07-05: qty 5

## 2018-07-05 NOTE — Patient Instructions (Signed)
[facility] Discharge Instructions for Patients Receiving Chemotherapy  Today you received the following chemotherapy agents: bendamustine   To help prevent nausea and vomiting after your treatment, we encourage you to take your nausea medication as prescribed   If you develop nausea and vomiting that is not controlled by your nausea medication, call the clinic.   BELOW ARE SYMPTOMS THAT SHOULD BE REPORTED IMMEDIATELY:  *FEVER GREATER THAN 100.5 F  *CHILLS WITH OR WITHOUT FEVER  NAUSEA AND VOMITING THAT IS NOT CONTROLLED WITH YOUR NAUSEA MEDICATION  *UNUSUAL SHORTNESS OF BREATH  *UNUSUAL BRUISING OR BLEEDING  TENDERNESS IN MOUTH AND THROAT WITH OR WITHOUT PRESENCE OF ULCERS  *URINARY PROBLEMS  *BOWEL PROBLEMS  UNUSUAL RASH Items with * indicate a potential emergency and should be followed up as soon as possible.  Feel free to call the clinic should you have any questions or concerns. The clinic phone number is [phone].  Please show the [facility] at check-in to the Emergency Department and triage nurse.  Rituximab; Hyaluronidase injection (Rituxan) What is this medicine? RITUXIMAB; HYALURONIDASE (ri TUX i mab / hye al ur ON i dase) is used to treat non-Hodgkin lymphoma and chronic lymphocytic leukemia. Rituximab is a monoclonal antibody. Hyaluronidase is used to improve the effects of rituximab. This medicine may be used for other purposes; ask your health care provider or pharmacist if you have questions. COMMON BRAND NAME(S): Rituxan Hycela What should I tell my health care provider before I take this medicine? They need to know if you have any of these conditions: -heart disease -infection (especially a virus infection such as hepatitis B, chickenpox, cold sores, or herpes) -immune system problems -irregular heartbeat -kidney disease -lung or breathing disease, like asthma -recently received or scheduled to receive a vaccine -an unusual  or allergic reaction to rituximab, rituximab/hyaluronidase, mouse proteins, other medicines, foods, dyes, or preservatives -pregnant or trying to get pregnant -breast-feeding How should I use this medicine? This medicine is for injection under the skin. It is given by a health care professional in a hospital or clinic setting. A special MedGuide will be given to you before each treatment. Be sure to read this information carefully each time. Talk to your pediatrician regarding the use of this medicine in children. Special care may be needed. Overdosage: If you think you have taken too much of this medicine contact a poison control center or emergency room at once. NOTE: This medicine is only for you. Do not share this medicine with others. What if I miss a dose? It is important not to miss your dose. Call your doctor or health care professional if you are unable to keep an appointment. What may interact with this medicine? This medicine may interact with the following medications: -cisplatin -live virus vaccines This list may not describe all possible interactions. Give your health care provider a list of all the medicines, herbs, non-prescription drugs, or dietary supplements you use. Also tell them if you smoke, drink alcohol, or use illegal drugs. Some items may interact with your medicine. What should I watch for while using this medicine? Your condition will be monitored carefully while you are receiving this medicine. You may need blood work done while you are taking this medicine. This medicine can cause serious allergic reactions. To reduce your risk you may need to take medicine before treatment with this medicine. Take your medicine as directed. In some patients, this medicine may cause a serious brain infection that may cause  death. If you have any problems seeing, thinking, speaking, walking, or standing, tell your doctor right away. If you cannot reach your doctor, urgently seek other  source of medical care. Call your doctor or health care professional for advice if you get a fever, chills or sore throat, or other symptoms of a cold or flu. Do not treat yourself. This drug decreases your body's ability to fight infections. Try to avoid being around people who are sick. Do not become pregnant while taking this medicine or for 12 months after stopping it. Women should inform their doctor if they wish to become pregnant or think they might be pregnant. There is a potential for serious side effects to an unborn child. Talk to your health care professional or pharmacist for more information. Do not breast-feed an infant while taking this medicine or for at least 6 months after stopping it. What side effects may I notice from receiving this medicine? Side effects that you should report to your doctor or health care professional as soon as possible: -breathing problems -chest pain -dizziness or feeling faint -fast, irregular heartbeat -low blood counts - this medicine may decrease the number of white blood cells, red blood cells and platelets. You may be at increased risk for infections and bleeding. -mouth sores -redness, blistering, peeling or loosening of the skin, including inside the mouth (this can be added for any serious or exfoliative rash that could lead to hospitalization) -signs of infection - fever or chills, cough, sore throat, pain or difficulty passing urine -signs and symptoms of kidney injury like trouble passing urine or change in the amount of urine -signs and symptoms of liver injury like dark yellow or brown urine; general ill feeling or flu-like symptoms; light-colored stools; loss of appetite; nausea; right upper belly pain; unusually weak or tired; yellowing of the eyes or skin -stomach pain -vomiting Side effects that usually do not require medical attention (report these to your doctor or health care professional if they continue or are  bothersome): -constipation -hair loss -headache -muscle cramps or muscle pain -pain, redness, or irritation at site where injected This list may not describe all possible side effects. Call your doctor for medical advice about side effects. You may report side effects to FDA at 1-800-FDA-1088. Where should I keep my medicine? This drug is given in a hospital or clinic and will not be stored at home. NOTE: This sheet is a summary. It may not cover all possible information. If you have questions about this medicine, talk to your doctor, pharmacist, or health care provider.  2018 Elsevier/Gold Standard (2016-01-05 15:36:38)

## 2018-07-09 ENCOUNTER — Telehealth: Payer: Self-pay

## 2018-07-09 NOTE — Telephone Encounter (Signed)
Left a detailed voice msg concerning her upcoming  Appointment. Mailed a letter with a calender enclosed. Per 1/23 los

## 2018-07-11 ENCOUNTER — Other Ambulatory Visit: Payer: Self-pay | Admitting: Physician Assistant

## 2018-07-11 ENCOUNTER — Other Ambulatory Visit: Payer: Self-pay | Admitting: Student

## 2018-07-12 ENCOUNTER — Ambulatory Visit (HOSPITAL_COMMUNITY)
Admission: RE | Admit: 2018-07-12 | Discharge: 2018-07-12 | Disposition: A | Discharge status: Home / Self Care | Payer: Medicare Other | Source: Ambulatory Visit | Attending: Hematology | Admitting: Hematology

## 2018-07-12 ENCOUNTER — Encounter (HOSPITAL_COMMUNITY): Payer: Self-pay

## 2018-07-12 ENCOUNTER — Other Ambulatory Visit: Payer: Self-pay

## 2018-07-12 DIAGNOSIS — I10 Essential (primary) hypertension: Secondary | ICD-10-CM | POA: Insufficient documentation

## 2018-07-12 DIAGNOSIS — E785 Hyperlipidemia, unspecified: Secondary | ICD-10-CM | POA: Insufficient documentation

## 2018-07-12 DIAGNOSIS — F419 Anxiety disorder, unspecified: Secondary | ICD-10-CM | POA: Diagnosis not present

## 2018-07-12 DIAGNOSIS — M797 Fibromyalgia: Secondary | ICD-10-CM | POA: Insufficient documentation

## 2018-07-12 DIAGNOSIS — Z79899 Other long term (current) drug therapy: Secondary | ICD-10-CM | POA: Insufficient documentation

## 2018-07-12 DIAGNOSIS — Z5111 Encounter for antineoplastic chemotherapy: Secondary | ICD-10-CM | POA: Diagnosis not present

## 2018-07-12 DIAGNOSIS — Z452 Encounter for adjustment and management of vascular access device: Secondary | ICD-10-CM | POA: Diagnosis not present

## 2018-07-12 DIAGNOSIS — Z7982 Long term (current) use of aspirin: Secondary | ICD-10-CM | POA: Diagnosis not present

## 2018-07-12 DIAGNOSIS — R7303 Prediabetes: Secondary | ICD-10-CM | POA: Diagnosis not present

## 2018-07-12 DIAGNOSIS — C8230 Follicular lymphoma grade IIIa, unspecified site: Secondary | ICD-10-CM | POA: Diagnosis not present

## 2018-07-12 DIAGNOSIS — Z853 Personal history of malignant neoplasm of breast: Secondary | ICD-10-CM | POA: Insufficient documentation

## 2018-07-12 DIAGNOSIS — C8238 Follicular lymphoma grade IIIa, lymph nodes of multiple sites: Secondary | ICD-10-CM

## 2018-07-12 DIAGNOSIS — C8290 Follicular lymphoma, unspecified, unspecified site: Secondary | ICD-10-CM | POA: Diagnosis not present

## 2018-07-12 DIAGNOSIS — F329 Major depressive disorder, single episode, unspecified: Secondary | ICD-10-CM | POA: Insufficient documentation

## 2018-07-12 HISTORY — PX: IR IMAGING GUIDED PORT INSERTION: IMG5740

## 2018-07-12 LAB — APTT: aPTT: 27 seconds (ref 24–36)

## 2018-07-12 LAB — CBC
HCT: 38.7 % (ref 36.0–46.0)
Hemoglobin: 12.9 g/dL (ref 12.0–15.0)
MCH: 29.3 pg (ref 26.0–34.0)
MCHC: 33.3 g/dL (ref 30.0–36.0)
MCV: 87.8 fL (ref 80.0–100.0)
Platelets: 128 10*3/uL — ABNORMAL LOW (ref 150–400)
RBC: 4.41 MIL/uL (ref 3.87–5.11)
RDW: 14.7 % (ref 11.5–15.5)
WBC: 5.1 10*3/uL (ref 4.0–10.5)
nRBC: 0 % (ref 0.0–0.2)

## 2018-07-12 LAB — PROTIME-INR
INR: 0.89
Prothrombin Time: 12 seconds (ref 11.4–15.2)

## 2018-07-12 MED ORDER — LIDOCAINE HCL (PF) 1 % IJ SOLN
INTRAMUSCULAR | Status: AC | PRN
  Administered 2018-07-12: 10 mL

## 2018-07-12 MED ORDER — VANCOMYCIN HCL IN DEXTROSE 1-5 GM/200ML-% IV SOLN
INTRAVENOUS | Status: AC
  Administered 2018-07-12: 1000 mg via INTRAVENOUS
  Filled 2018-07-12: qty 200

## 2018-07-12 MED ORDER — VANCOMYCIN HCL IN DEXTROSE 1-5 GM/200ML-% IV SOLN
1000.0000 mg | Freq: Once | INTRAVENOUS | Status: AC
  Administered 2018-07-12: 1000 mg via INTRAVENOUS

## 2018-07-12 MED ORDER — MIDAZOLAM HCL 2 MG/2ML IJ SOLN
INTRAMUSCULAR | Status: AC | PRN
  Administered 2018-07-12 (×2): 1 mg via INTRAVENOUS

## 2018-07-12 MED ORDER — LIDOCAINE-EPINEPHRINE (PF) 1 %-1:200000 IJ SOLN
INTRAMUSCULAR | Status: AC | PRN
  Administered 2018-07-12: 10 mL

## 2018-07-12 MED ORDER — SODIUM CHLORIDE 0.9 % IV SOLN
INTRAVENOUS | Status: DC
  Administered 2018-07-12: 14:00:00 via INTRAVENOUS

## 2018-07-12 MED ORDER — FENTANYL CITRATE (PF) 100 MCG/2ML IJ SOLN
INTRAMUSCULAR | Status: AC | PRN
  Administered 2018-07-12 (×2): 50 ug via INTRAVENOUS

## 2018-07-12 MED ORDER — MIDAZOLAM HCL 2 MG/2ML IJ SOLN
INTRAMUSCULAR | Status: AC
  Filled 2018-07-12: qty 2

## 2018-07-12 MED ORDER — FENTANYL CITRATE (PF) 100 MCG/2ML IJ SOLN
INTRAMUSCULAR | Status: AC
  Filled 2018-07-12: qty 2

## 2018-07-12 MED ORDER — HEPARIN SOD (PORK) LOCK FLUSH 100 UNIT/ML IV SOLN
INTRAVENOUS | Status: AC | PRN
  Administered 2018-07-12: 500 [IU]

## 2018-07-12 NOTE — Discharge Instructions (Signed)
Do not use EMLA cream on dermabond (skin glue) until the site over the port has healed. EMLA cream is used to numb the area before it is accessed with a needle. The EMLA cream will dissolve the skin glue.     Implanted [facility] Guide An implanted port is a device that is placed under the skin. It is usually placed in the chest. The device can be used to give IV medicine, to take blood, or for dialysis. You may have an implanted port if:  You need IV medicine that would be irritating to the small veins in your hands or arms.  You need IV medicines, such as antibiotics, for a long period of time.  You need IV nutrition for a long period of time.  You need dialysis. Having a port means that your health care provider will not need to use the veins in your arms for these procedures. You may have fewer limitations when using a port than you would if you used other types of long-term IVs, and you will likely be able to return to normal activities after your incision heals. An implanted port has two main parts:  Reservoir. The reservoir is the part where a needle is inserted to give medicines or draw blood. The reservoir is round. After it is placed, it appears as a small, raised area under your skin.  Catheter. The catheter is a thin, flexible tube that connects the reservoir to a vein. Medicine that is inserted into the reservoir goes into the catheter and then into the vein. How is my port accessed? To access your port:  A numbing cream may be placed on the skin over the port site.  Your health care provider will put on a mask and sterile gloves.  The skin over your port will be cleaned carefully with a germ-killing soap and allowed to dry.  Your health care provider will gently pinch the port and insert a needle into it.  Your health care provider will check for a blood return to make sure the port is in the vein and is not clogged.  If your port needs to remain accessed to get  medicine continuously (constant infusion), your health care provider will place a clear bandage (dressing) over the needle site. The dressing and needle will need to be changed every week, or as told by your health care provider. What is flushing? Flushing helps keep the port from getting clogged. Follow instructions from your health care provider about how and when to flush the port. Ports are usually flushed with saline solution or a medicine called heparin. The need for flushing will depend on how the port is used:  If the port is only used from time to time to give medicines or draw blood, the port may need to be flushed: ? Before and after medicines have been given. ? Before and after blood has been drawn. ? As part of routine maintenance. Flushing may be recommended every 4-6 weeks.  If a constant infusion is running, the port may not need to be flushed.  Throw away any syringes in a disposal container that is meant for sharp items (sharps container). You can buy a sharps container from a pharmacy, or you can make one by using an empty hard plastic bottle with a cover. How long will my port stay implanted? The port can stay in for as long as your health care provider thinks it is needed. When it is time for the port  to come out, a surgery will be done to remove it. The surgery will be similar to the procedure that was done to put the port in. Follow these instructions at home:   Flush your port as told by your health care provider.  If you need an infusion over several days, follow instructions from your health care provider about how to take care of your port site. Make sure you: ? Wash your hands with soap and water before you change your dressing. If soap and water are not available, use alcohol-based hand sanitizer. ? Change your dressing as told by your health care provider. ? Place any used dressings or infusion bags into a plastic bag. Throw that bag in the trash. ? Keep the  dressing that covers the needle clean and dry. Do not get it wet. ? Do not use scissors or sharp objects near the tube. ? Keep the tube clamped, unless it is being used.  Check your port site every day for signs of infection. Check for: ? Redness, swelling, or pain. ? Fluid or blood. ? Pus or a bad smell.  Protect the skin around the port site. ? Avoid wearing bra straps that rub or irritate the site. ? Protect the skin around your port from seat belts. Place a soft pad over your chest if needed.  Bathe or shower as told by your health care provider. The site may get wet as long as you are not actively receiving an infusion.  Return to your normal activities as told by your health care provider. Ask your health care provider what activities are safe for you.  Carry a medical alert card or wear a medical alert bracelet at all times. This will let health care providers know that you have an implanted port in case of an emergency. Get help right away if:  You have redness, swelling, or pain at the port site.  You have fluid or blood coming from your port site.  You have pus or a bad smell coming from the port site.  You have a fever. Summary  Implanted ports are usually placed in the chest for long-term IV access.  Follow instructions from your health care provider about flushing the port and changing bandages (dressings).  Take care of the area around your port by avoiding clothing that puts pressure on the area, and by watching for signs of infection.  Protect the skin around your port from seat belts. Place a soft pad over your chest if needed.  Get help right away if you have a fever or you have redness, swelling, pain, drainage, or a bad smell at the port site. This information is not intended to replace advice given to you by your health care provider. Make sure you discuss any questions you have with your health care provider. Document Released: 05/29/2005 Document Revised:  07/01/2016 Document Reviewed: 07/01/2016 Elsevier Interactive Patient Education  2019 [place].     Moderate Conscious Sedation, Adult, Care After These instructions provide you with information about caring for yourself after your procedure. Your health care provider may also give you more specific instructions. Your treatment has been planned according to current medical practices, but problems sometimes occur. Call your health care provider if you have any problems or questions after your procedure. What can I expect after the procedure? After your procedure, it is common:  To feel sleepy for several hours.  To feel clumsy and have poor balance for several hours.  To have  poor judgment for several hours.  To vomit if you eat too soon. Follow these instructions at home: For at least 24 hours after the procedure:   Do not: ? Participate in activities where you could fall or become injured. ? Drive. ? Use heavy machinery. ? Drink alcohol. ? Take sleeping pills or medicines that cause drowsiness. ? Make important decisions or sign legal documents. ? Take care of children on your own.  Rest. Eating and drinking  Follow the diet recommended by your health care provider.  If you vomit: ? Drink water, juice, or soup when you can drink without vomiting. ? Make sure you have little or no nausea before eating solid foods. General instructions  Have a responsible adult stay with you until you are awake and alert.  Take over-the-counter and prescription medicines only as told by your health care provider.  If you smoke, do not smoke without supervision.  Keep all follow-up visits as told by your health care provider. This is important. Contact a health care provider if:  You keep feeling nauseous or you keep vomiting.  You feel light-headed.  You develop a rash.  You have a fever. Get help right away if:  You have trouble breathing. This information is not  intended to replace advice given to you by your health care provider. Make sure you discuss any questions you have with your health care provider. Document Released: 03/19/2013 Document Revised: 11/01/2015 Document Reviewed: 09/18/2015 Elsevier Interactive Patient Education  2019 [employer].

## 2018-07-12 NOTE — H&P (Signed)
Chief Complaint: Patient was seen in consultation today for port at the request of [name]  Referring Physician(s): [name]  Supervising Physician: Markus [place]  Patient Status: [facility] - Out-pt  History of Present Illness: Rachael Greer is a 78 y.o. female recently diagnosed with follicular lymphoma. She is referred for port placement. PMHx, meds, labs, imaging, allergies reviewed. Feels well, no recent fevers, chills, illness. Has been NPO today as directed.   Past Medical History:  Diagnosis Date  . Anxiety   . Breast cancer ([place])   . Chest pain   . Depression   . DOE (dyspnea on exertion)   . Eustachian tube obstruction, bilateral   . Fibromyalgia   . Gastric reflux   . History of kidney stones   . Hyperlipidemia   . Hypertension   . IBS (irritable bowel syndrome)   . Interstitial cystitis   . Nephrolithiasis   . Nephrolithiasis   . Palpitations   . Pre-diabetes   . Sepsis [facility])     Past Surgical History:  Procedure Laterality Date  . BREAST DUCTAL SYSTEM EXCISION Right 10/30/2016   Procedure: RIGHT BREAST DUCT EXCISION;  Surgeon: Rolm Bookbinder, MD;  Location: [facility];  Service: General;  Laterality: Right;  . EYE SURGERY     bilateral lense placements  . MASTECTOMY Left   . NECK SURGERY     muscles cut in her neck  . NEPHROSTOMY    . TONSILLECTOMY      Allergies: Amoxicillin and No known allergies  Medications: Prior to Admission medications   Medication Sig Start Date End Date Taking? Authorizing Provider  amLODipine (NORVASC) 2.5 MG tablet Take 2.5 mg by mouth daily. 09/03/16   [provider]  aspirin EC 81 MG tablet Take 1 tablet (81 mg total) by mouth daily. 05/15/18   Lelon Perla, MD  atorvastatin (LIPITOR) 20 MG tablet Take 1 tablet (20 mg total) by mouth daily at 6 PM. 05/15/18   Crenshaw, [name], MD  clonazePAM (KLONOPIN) 1 MG tablet Take 1 mg by mouth 2 (two) times daily as needed for anxiety.   03/31/16   [provider]  Ibuprofen-Diphenhydramine HCl (ADVIL PM) 200-25 MG CAPS Take 0.5-1 tablets by mouth daily as needed (sleep).     [provider]  metoprolol succinate (TOPROL-XL) 25 MG 24 hr tablet Take 25 mg by mouth every evening.    [provider]  omeprazole (PRILOSEC) 40 MG capsule Take 40 mg by mouth daily.    [provider]  ondansetron (ZOFRAN) 8 MG tablet Take 1 tablet (8 mg total) by mouth 2 (two) times daily as needed for nausea or vomiting. 05/10/18   [name], MD  pentosan polysulfate (ELMIRON) 100 MG capsule Take 100 mg by mouth 3 (three) times daily.     [provider]  Polyethyl Glycol-Propyl Glycol (SYSTANE OP) Apply 1 drop to eye 2 (two) times daily.    [provider]  prochlorperazine (COMPAZINE) 10 MG tablet TAKE 1 TABLET(10 MG) BY MOUTH EVERY 6 HOURS AS NEEDED FOR NAUSEA OR VOMITING 06/28/18   Volanda Napoleon, MD     Family History  Problem Relation Age of Onset  . CAD Father   . Heart attack Father   . Dementia Mother     Social History   Socioeconomic History  . Marital status: Widowed    Spouse name: Not on file  . Number of children: 4  . Years of education: Not  on file  . Highest education level: Not on file  Occupational History  . Not on file  Social Needs  . Financial resource strain: Not on file  . Food insecurity:    Worry: Not on file    Inability: Not on file  . Transportation needs:    Medical: Not on file    Non-medical: Not on file  Tobacco Use  . Smoking status: Never Smoker  . Smokeless tobacco: Never Used  Substance and Sexual Activity  . Alcohol use: No  . Drug use: No  . Sexual activity: Not on file  Lifestyle  . Physical activity:    Days per week: Not on file    Minutes per session: Not on file  . Stress: Not on file  Relationships  . Social connections:    Talks on phone: Not on file    Gets together: Not on file    Attends religious service:  Not on file    Active member of club or organization: Not on file    Attends meetings of clubs or organizations: Not on file    Relationship status: Not on file  Other Topics Concern  . Not on file  Social History Narrative  . Not on file     Review of Systems: A 12 point ROS discussed and pertinent positives are indicated in the HPI above.  All other systems are negative.  Review of Systems  Vital Signs: BP (!) 110/53   Pulse 72   Temp 97.6 F (36.4 C) (Oral)   Ht 5\' 3"  (1.6 m)   Wt 72.1 kg   SpO2 100%   BMI 28.17 kg/m   Physical Exam Constitutional:      Appearance: Normal appearance.  HENT:     Mouth/Throat:     Mouth: Mucous membranes are moist.     Pharynx: Oropharynx is clear.  Cardiovascular:     Rate and Rhythm: Normal rate and regular rhythm.     Heart sounds: Normal heart sounds.  Pulmonary:     Effort: Pulmonary effort is normal. No respiratory distress.     Breath sounds: Normal breath sounds.  Skin:    General: Skin is warm and dry.  Neurological:     General: No focal deficit present.     Mental Status: She is alert and oriented to person, place, and time.  Psychiatric:        Mood and Affect: Mood normal.        Judgment: Judgment normal.      Imaging: No results found.  Labs:  CBC: Recent Labs    05/22/18 1120 06/04/18 1431 06/18/18 1414 07/04/18 0919  WBC 6.1 6.3 6.6 4.5  HGB 14.6 15.0 14.3 14.2  HCT 43.9 44.7 42.0 42.0  PLT 200 206 167 144*    COAGS: No results for input(s): INR, APTT in the last 8760 hours.  BMP: Recent Labs    05/22/18 1120 06/04/18 1431 06/18/18 1414 07/04/18 0919  NA 142 141 141 142  K 4.2 3.9 3.5 3.8  CL 107 109 108 110  CO2 26 22 23 22   GLUCOSE 78 94 93 93  BUN 20 18 11 14   CALCIUM 10.5* 10.8* 10.3 10.3  CREATININE 0.91 0.98 0.80 0.77  GFRNONAA >60 56* >60 >60  GFRAA >60 >60 >60 >60    LIVER FUNCTION TESTS: Recent Labs    05/22/18 1120 06/04/18 1431 06/18/18 1414 07/04/18 0919    BILITOT 1.1 1.4* 1.6* 1.8*  AST 19  23 25 21   ALT 28 35 36 31  ALKPHOS 79 92 102 99  PROT 7.1 7.3 7.0 6.8  ALBUMIN 4.0 4.3 3.9 3.8    TUMOR MARKERS: No results for input(s): AFPTM, CEA, CA199, CHROMGRNA in the last 8760 hours.  Assessment and Plan: Follicular Lymphoma For port placement Labs pending Risks and benefits of image guided port-a-catheter placement was discussed with the patient including, but not limited to bleeding, infection, pneumothorax, or fibrin sheath development and need for additional procedures.  All of the patient's questions were answered, patient is agreeable to proceed. Consent signed and in chart.    Thank you for this interesting consult.  I greatly enjoyed meeting Rachael Greer and look forward to participating in their care.  A copy of this report was sent to the requesting provider on this date.  Electronically Signed: Ascencion Dike, PA-C 07/12/2018, 1:50 PM   I spent a total of 20 minutes in face to face in clinical consultation, greater than 50% of which was counseling/coordinating care for port

## 2018-07-12 NOTE — Procedures (Addendum)
Interventional Radiology Procedure:   Indications: Follicular lymphoma  Procedure: Port  Findings: Right jugular port, tip at SVC/RA junction  Complications: None     EBL: Minimal  Plan: Discharge to home.    Daxtyn Rottenberg R. Anselm Pancoast, MD  Pager: [phone]

## 2018-07-15 NOTE — Telephone Encounter (Signed)
Oncology Nurse Navigator Documentation  Ms. Ogan called expressing concern about PAC dsg removal s/p last Friday's insertion.  She removed dsg per my verbal guidance.  I indicated she should leave liquid band aid covering intact, noted it will come off with showering over the next couple of days.  She voiced understanding.  [name], RN, BSN Head & Neck Oncology Nurse [place] [place] [phone]

## 2018-07-16 ENCOUNTER — Encounter: Payer: Self-pay | Admitting: *Deleted

## 2018-07-23 ENCOUNTER — Telehealth: Payer: Self-pay | Admitting: *Deleted

## 2018-07-23 ENCOUNTER — Other Ambulatory Visit: Payer: Self-pay | Admitting: *Deleted

## 2018-07-23 DIAGNOSIS — N200 Calculus of kidney: Secondary | ICD-10-CM | POA: Diagnosis not present

## 2018-07-23 DIAGNOSIS — N3010 Interstitial cystitis (chronic) without hematuria: Secondary | ICD-10-CM | POA: Diagnosis not present

## 2018-07-23 MED ORDER — LIDOCAINE-PRILOCAINE 2.5-2.5 % EX CREA: 1.0000 "application " | TOPICAL_CREAM | CUTANEOUS | 0 refills | Status: DC | PRN

## 2018-07-23 NOTE — Telephone Encounter (Signed)
Received call from patient/friend Rachael Greer - on [facility])  requesting EMLA creme prescription sent to pharmacy. Port placed 07/12/2018 and infusion appt is 2/20. Prescription authorized and sent. Left message for friend with this information.

## 2018-07-25 DIAGNOSIS — R413 Other amnesia: Secondary | ICD-10-CM | POA: Diagnosis not present

## 2018-07-31 NOTE — Progress Notes (Signed)
HEMATOLOGY/ONCOLOGY CONSULTATION NOTE  Date of Service: 08/01/2018  Patient Care Team: [name], MD as PCP - General (Internal Medicine) Stanford Breed [name], MD as PCP - Cardiology (Cardiology) [name], MD as Consulting Physician (Hematology) [name], RN as Oncology Nurse Navigator (Oncology)  CHIEF COMPLAINTS/PURPOSE OF CONSULTATION:  Follicular lymphoma grade 3a  HISTORY OF PRESENTING ILLNESS:   Rachael Greer is a wonderful 78 y.o. female who has been referred to [name] by Dr. 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 She is accompanied today by a friend, and our Nurse Navigator [name]. The pt reports that she is doing well overall.   The pt reports that things began changing last September in 2018. She notes that she has taken Flonase, Claritin, and saline solution and did not have a runny nose, and did not have any breathing difficulty. She notes that her head felt as though she had a flu or a cold, without any other symptoms. She notes that she felt as if her ears were swollen and has had some minimal hearing impairment, and has myringotomy tubes. She began a steroid course about 4 months ago with Dr. [name]. She notes that she first noticed right sided neck swelling in the last month, and left sided neck swelling which began 2-3 weeks ago.   She obtained a CT Neck on 03/08/18, as noted below.   The pt notes that she has not had fevers or chills, but has woken up recently with night sweats occasionally over the last 6 months. She adds that she has noticed some SOB in the last 7-8 months which presents intermittently, and has not progressed. The pt notes that she has had some difficulty swallowing in the past, and saw Dr. [name] for concerns of a stricture, and adds that this she has had recent difficulty swallowing. The pt also notes that she has recently felt dizziness, described as the room spinning. She has not had her dizziness worked  up.  The pt notes that she had a mastectomy without radiation or chemotherapy for breast cancer in situ in 2013. She began tamoxifen preventatively afterwards for 2.5 years.   The pt notes that her interstitial cystitis cause is unknown. She notes that she took a TB vaccine 5-7 years ago, and developed severe pain and sepsis. She notes that she has not had recurrent UTIs and takes Elmiron under the care of Dr. [name] in urology. She notes that there are certain foods that she cannot eat without developing bladder pain. She also denies blood in the urine.  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 2. RIGHT lymphadenitis seen with superimposed infection or pericapsular spread of tumor.  Most recent lab results (03/08/18) of CBC w/diff and BMP is as follows: all values are WNL except for RBC at 5.22, HGB at 15.3.  On review of systems, pt reports some night sweats, some SOB, right neck swelling, left neck swelling, dizziness, hearing impairment, healed incision, right ear numbness, and denies fevers, chills, runny nose, abdominal pains, abdominal distension, blood in the urine, sinus pressure or pain, pain along the spine, leg swelling, skin rashes, and any other symptoms.   On PMHx the pt reports breast cancer in 2013, sternocleidomastoid release in childhood, interstitial cystitis, sepsis. On Social Hx the pt denies ever  smoking cigarettes and denies ever consuming alcohol  Interval History:  Rachael Greer returns today for management and evaluation of her Grade 3a Follicular Lymphoma, and prior to C3 BR. The patient's last visit with [name] was on 07/04/18. She is accompanied today by a friend. The pt reports that she is doing well overall.   The pt reports that she is  no longer having any nasal symptoms and denies congestion. She notes that she has enjoyed stable energy levels. She notes that she is sleeping well. She denies any new urinary symptoms.  The pt notes that her memory has been worse generally in the past couple years, and is concerned that she has had recent worsening in her memory. She is now having assistance in her home three times a week. She describes an episode of delirium that occurred a few days ago, in which she felt confused while placing mail in envelopes. She denies any headaches or changes in vision. She notes that she will be following up with her PCP Dr. [name] regarding this.  Lab results today (08/01/18) of CBC w/diff and CMP is as follows: all values are WNL except for PLT at 142k, Glucose at 104, Total Bilirubin at 1.8, GFR at 55.  On review of systems, pt reports stable energy levels, sleeping well, recent episode of confusion, moving her bowels well, and denies nasal congestion, fevers, chills, respiratory infections, difficulty or pain passing urine, headaches, changes in vision, ear pain, abdominal pain, leg swelling, and any other symptoms.  MEDICAL HISTORY:  Past Medical History:  Diagnosis Date  . Anxiety   . Breast cancer ([place])   . Chest pain   . Depression   . DOE (dyspnea on exertion)   . Eustachian tube obstruction, bilateral   . Fibromyalgia   . Gastric reflux   . History of kidney stones   . Hyperlipidemia   . Hypertension   . IBS (irritable bowel syndrome)   . Interstitial cystitis   . Nephrolithiasis   . Nephrolithiasis   . Palpitations   . Pre-diabetes   . Sepsis ([place])     SURGICAL HISTORY: Past Surgical History:  Procedure Laterality Date  . BREAST DUCTAL SYSTEM EXCISION Right 10/30/2016   Procedure: RIGHT BREAST DUCT EXCISION;  Surgeon: Rolm Bookbinder, MD;  Location: [facility];  Service: General;  Laterality: Right;  . EYE SURGERY     bilateral lense placements  . IR IMAGING GUIDED PORT  INSERTION  07/12/2018  . MASTECTOMY Left   . NECK SURGERY     muscles cut in her neck  . NEPHROSTOMY    . TONSILLECTOMY      SOCIAL HISTORY: Social History   Socioeconomic History  . Marital status: Widowed    Spouse name: Not on file  . Number of children: 4  . Years of education: Not on file  . Highest education level: Not on file  Occupational History  . Not on file  Social Needs  . Financial resource strain: Not on file  . Food insecurity:    Worry: Not on file    Inability: Not on file  . Transportation needs:    Medical: Not on file    Non-medical: Not on file  Tobacco Use  . Smoking status: Never Smoker  . Smokeless tobacco: Never Used  Substance and Sexual Activity  . Alcohol use: No  . Drug use: No  . Sexual activity: Not on file  Lifestyle  . Physical activity:    Days  per week: Not on file    Minutes per session: Not on file  . Stress: Not on file  Relationships  . Social connections:    Talks on phone: Not on file    Gets together: Not on file    Attends religious service: Not on file    Active member of club or organization: Not on file    Attends meetings of clubs or organizations: Not on file    Relationship status: Not on file  . Intimate partner violence:    Fear of current or ex partner: Not on file    Emotionally abused: Not on file    Physically abused: Not on file    Forced sexual activity: Not on file  Other Topics Concern  . Not on file  Social History Narrative  . Not on file    FAMILY HISTORY: Family History  Problem Relation Age of Onset  . CAD Father   . Heart attack Father   . Dementia Mother     ALLERGIES:  is allergic to amoxicillin and no known allergies.  MEDICATIONS:  Current Outpatient Medications  Medication Sig Dispense Refill  . amLODipine (NORVASC) 2.5 MG tablet Take 2.5 mg by mouth daily.    Marland Kitchen aspirin EC 81 MG tablet Take 1 tablet (81 mg total) by mouth daily. 90 tablet 3  . atorvastatin (LIPITOR) 20 MG  tablet Take 1 tablet (20 mg total) by mouth daily at 6 PM. 90 tablet 3  . clonazePAM (KLONOPIN) 1 MG tablet Take 1 mg by mouth 2 (two) times daily as needed for anxiety.     . Ibuprofen-Diphenhydramine HCl (ADVIL PM) 200-25 MG CAPS Take 0.5-1 tablets by mouth daily as needed (sleep).     Marland Kitchen lidocaine-prilocaine (EMLA) cream Apply 1 application topically as needed. 30 g 0  . metoprolol succinate (TOPROL-XL) 25 MG 24 hr tablet Take 25 mg by mouth every evening.    Marland Kitchen omeprazole (PRILOSEC) 40 MG capsule Take 40 mg by mouth daily.    . ondansetron (ZOFRAN) 8 MG tablet Take 1 tablet (8 mg total) by mouth 2 (two) times daily as needed for nausea or vomiting. 40 tablet 1  . pentosan polysulfate (ELMIRON) 100 MG capsule Take 100 mg by mouth 3 (three) times daily.     Vladimir Faster Glycol-Propyl Glycol (SYSTANE OP) Apply 1 drop to eye 2 (two) times daily.    . prochlorperazine (COMPAZINE) 10 MG tablet TAKE 1 TABLET(10 MG) BY MOUTH EVERY 6 HOURS AS NEEDED FOR NAUSEA OR VOMITING 30 tablet 0   No current facility-administered medications for this visit.     REVIEW OF SYSTEMS:    A 10+ POINT REVIEW OF SYSTEMS WAS OBTAINED including neurology, dermatology, psychiatry, cardiac, respiratory, lymph, extremities, GI, GU, Musculoskeletal, constitutional, breasts, reproductive, HEENT.  All pertinent positives are noted in the HPI.  All others are negative.   PHYSICAL EXAMINATION: ECOG PERFORMANCE STATUS: 2 - Symptomatic, <50% confined to bed  Vitals:   08/01/18 0845  BP: (!) 130/95  Pulse: 73  Resp: 18  Temp: 97.7 F (36.5 C)  SpO2: 100%   Filed Weights   08/01/18 0845  Weight: 156 lb 14.4 oz (71.2 kg)   .Body mass index is 27.79 kg/m.  GENERAL:alert, in no acute distress and comfortable SKIN: no acute rashes, no significant lesions EYES: conjunctiva are pink and non-injected, sclera anicteric OROPHARYNX: MMM, no exudates, no oropharyngeal erythema or ulceration NECK: supple, no JVD LYMPH: Palpable  lymphadenopathy in the left cervical,  4-5cm palpable LN in right cervical, no palpable lymphadenopathy in the axillary or inguinal regions LUNGS: clear to auscultation b/l with normal respiratory effort HEART: regular rate & rhythm ABDOMEN:  normoactive bowel sounds , non tender, not distended. No palpable hepatosplenomegaly.  Extremity: no pedal edema PSYCH: alert & oriented x 3 with fluent speech NEURO: no focal motor/sensory deficits   LABORATORY DATA:  I have reviewed the data as listed  . CBC Latest Ref Rng & Units 08/01/2018 07/12/2018 07/04/2018  WBC 4.0 - 10.5 K/uL 4.7 5.1 4.5  Hemoglobin 12.0 - 15.0 g/dL 14.4 12.9 14.2  Hematocrit 36.0 - 46.0 % 42.8 38.7 42.0  Platelets 150 - 400 K/uL 142(L) 128(L) 144(L)    . CMP Latest Ref Rng & Units 08/01/2018 07/04/2018 06/18/2018  Glucose 70 - 99 mg/dL 104(H) 93 93  BUN 8 - 23 mg/dL 13 14 11   Creatinine 0.44 - 1.00 mg/dL 0.99 0.77 0.80  Sodium 135 - 145 mmol/L 141 142 141  Potassium 3.5 - 5.1 mmol/L 3.9 3.8 3.5  Chloride 98 - 111 mmol/L 108 110 108  CO2 22 - 32 mmol/L 23 22 23   Calcium 8.9 - 10.3 mg/dL 10.2 10.3 10.3  Total Protein 6.5 - 8.1 g/dL 6.8 6.8 7.0  Total Bilirubin 0.3 - 1.2 mg/dL 1.8(H) 1.8(H) 1.6(H)  Alkaline Phos 38 - 126 U/L 95 99 102  AST 15 - 41 U/L 26 21 25   ALT 0 - 44 U/L 27 31 36    03/22/18 Flow Cytometry:   03/22/18 Biopsy:     RADIOGRAPHIC STUDIES: I have personally reviewed the radiological images as listed and agreed with the findings in the report. Ir Imaging Guided Port Insertion  Result Date: 07/12/2018 INDICATION: 77 year old with follicular lymphoma and needs a Port-A-Cath for treatment. EXAM: FLUOROSCOPIC AND ULTRASOUND GUIDED PLACEMENT OF A SUBCUTANEOUS PORT COMPARISON:  None. MEDICATIONS: Vancomycin 1 g; The antibiotic was administered within an appropriate time interval prior to skin puncture. ANESTHESIA/SEDATION: Versed 2.0 mg IV; Fentanyl 100 mcg IV; Moderate Sedation Time:  36 minutes The  patient was continuously monitored during the procedure by the interventional radiology nurse under my direct supervision. FLUOROSCOPY TIME:  30 seconds, 3 mGy COMPLICATIONS: None immediate. PROCEDURE: The procedure, risks, benefits, and alternatives were explained to the patient. Questions regarding the procedure were encouraged and answered. The patient understands and consents to the procedure. Patient was placed supine on the interventional table. Ultrasound confirmed a patent right internal jugular vein. Ultrasound image was saved for documentation. The right chest and neck were cleaned with a skin antiseptic and a sterile drape was placed. Maximal barrier sterile technique was utilized including caps, mask, sterile gowns, sterile gloves, sterile drape, hand hygiene and skin antiseptic. The right neck was anesthetized with 1% lidocaine. Small incision was made in the right neck with a blade. Micropuncture set was placed in the right internal jugular vein with ultrasound guidance. The micropuncture wire was used for measurement purposes. The right chest was anesthetized with 1% lidocaine with epinephrine. #15 blade was used to make an incision and a subcutaneous port pocket was formed. [place] was assembled. Subcutaneous tunnel was formed with a stiff tunneling device. The port catheter was brought through the subcutaneous tunnel. The port was placed in the subcutaneous pocket and sutured in place. The micropuncture set was exchanged for a peel-away sheath. The catheter was placed through the peel-away sheath and the tip was positioned at the superior cavoatrial junction. Catheter placement was confirmed with fluoroscopy.  The port was accessed and flushed with heparinized saline. The port pocket was closed using two layers of absorbable sutures and Dermabond. The vein skin site was closed using a single layer of absorbable suture and Dermabond. Sterile dressings were applied. Patient tolerated the  procedure well without an immediate complication. Ultrasound and fluoroscopic images were taken and saved for this procedure. IMPRESSION: Placement of a subcutaneous port device. The catheter tip is at the superior cavoatrial junction and ready to be used. Electronically Signed   By: Markus Daft M.D.   On: 07/12/2018 17:42    ASSESSMENT & PLAN:   78 y.o. female with  1. 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 2. RIGHT lymphadenitis seen with superimposed infection or pericapsular spread of tumor.    03/22/18 biopsy revealed follicular lymphoma, Grade 3a   04/04/18 Hep B and Hep C negative   04/17/18 PET/CT revealed Notable bilateral adenopathy in the neck and posterior nasopharynx is hypermetabolic and mostly De[place] 5 level activity, compatible with malignancy. 2. No pathologically enlarged or significantly hypermetabolic adenopathy in the chest, or abdomen/pelvis. No splenomegaly. Slight enlargement of the photopenic splenic cyst noted on remote prior Exams. 3. Other imaging findings of potential clinical significance: Aortic Atherosclerosis. Coronary atherosclerosis. Diffuse hepatic steatosis. Nonobstructive left nephrolithiasis.   2. Interstitial cystitis - stable.  PLAN:  -Discussed pt labwork today, 08/01/18; blood counts and chemistries are stable -The pt has no prohibitive toxicities from continuing C3 dose reduced 70mg /m2 Bendamustine and Rituxan at this time -Will repeat PET/CT after the conclusion of C3 to evaluate treatment efficacy and for further treatment planning -Intending treatment of Bendamustine and Rituxan for 4-6 cycles. Chose not to treat with R-CHOP given interstitial cystitis. -Continue follow up with Urology Dr. Alona Bene -Discussed that the intent of treatment is to achieve  remission, is not curative -Continue follow up with PCP for dementia work up -Continue Vitamin B complex -Will see the pt back in 4 weeks   PET/CT in 3 weeks Please schedule next cycle of Bendamustine/Rituxan chemotherapy as per orders with labs and MD visit Port flush with all labs   All of the patients questions were answered with apparent satisfaction. The patient knows to call the clinic with any problems, questions or concerns.  The total time spent in the appt was 25 minutes and more than 50% was on counseling and direct patient cares.    Sullivan Lone MD MS AAHIVMS [facility] Hematology/Oncology Physician [facility]  (Office):       [phone] (Work cell):  [phone] (Fax):           [phone]  08/01/2018 9:24 AM  I, Baldwin [place], am acting as a scribe for Dr. Sullivan Lone.   .I have reviewed the above documentation for accuracy and completeness, and I agree with the above. [name] MD

## 2018-08-01 ENCOUNTER — Inpatient Hospital Stay: Payer: Medicare Other

## 2018-08-01 ENCOUNTER — Encounter: Payer: Self-pay | Admitting: *Deleted

## 2018-08-01 ENCOUNTER — Inpatient Hospital Stay: Payer: Medicare Other | Attending: Hematology

## 2018-08-01 ENCOUNTER — Telehealth: Payer: Self-pay | Admitting: Hematology

## 2018-08-01 ENCOUNTER — Inpatient Hospital Stay (HOSPITAL_BASED_OUTPATIENT_CLINIC_OR_DEPARTMENT_OTHER): Payer: Medicare Other | Admitting: Hematology

## 2018-08-01 VITALS — BP 97/45 | HR 69 | Temp 98.0°F | Resp 16

## 2018-08-01 VITALS — BP 130/95 | HR 73 | Temp 97.7°F | Resp 18 | Ht 63.0 in | Wt 156.9 lb

## 2018-08-01 DIAGNOSIS — Z79899 Other long term (current) drug therapy: Secondary | ICD-10-CM | POA: Diagnosis not present

## 2018-08-01 DIAGNOSIS — K219 Gastro-esophageal reflux disease without esophagitis: Secondary | ICD-10-CM

## 2018-08-01 DIAGNOSIS — R61 Generalized hyperhidrosis: Secondary | ICD-10-CM

## 2018-08-01 DIAGNOSIS — Z791 Long term (current) use of non-steroidal anti-inflammatories (NSAID): Secondary | ICD-10-CM

## 2018-08-01 DIAGNOSIS — Z86000 Personal history of in-situ neoplasm of breast: Secondary | ICD-10-CM | POA: Diagnosis not present

## 2018-08-01 DIAGNOSIS — C8238 Follicular lymphoma grade IIIa, lymph nodes of multiple sites: Secondary | ICD-10-CM

## 2018-08-01 DIAGNOSIS — M797 Fibromyalgia: Secondary | ICD-10-CM | POA: Insufficient documentation

## 2018-08-01 DIAGNOSIS — Z5112 Encounter for antineoplastic immunotherapy: Secondary | ICD-10-CM | POA: Insufficient documentation

## 2018-08-01 DIAGNOSIS — Z5111 Encounter for antineoplastic chemotherapy: Secondary | ICD-10-CM | POA: Insufficient documentation

## 2018-08-01 DIAGNOSIS — Z9012 Acquired absence of left breast and nipple: Secondary | ICD-10-CM | POA: Insufficient documentation

## 2018-08-01 DIAGNOSIS — Z7189 Other specified counseling: Secondary | ICD-10-CM

## 2018-08-01 DIAGNOSIS — N3010 Interstitial cystitis (chronic) without hematuria: Secondary | ICD-10-CM

## 2018-08-01 DIAGNOSIS — C8231 Follicular lymphoma grade IIIa, lymph nodes of head, face, and neck: Secondary | ICD-10-CM

## 2018-08-01 DIAGNOSIS — Z7982 Long term (current) use of aspirin: Secondary | ICD-10-CM | POA: Insufficient documentation

## 2018-08-01 DIAGNOSIS — I2510 Atherosclerotic heart disease of native coronary artery without angina pectoris: Secondary | ICD-10-CM | POA: Insufficient documentation

## 2018-08-01 DIAGNOSIS — Z9223 Personal history of estrogen therapy: Secondary | ICD-10-CM | POA: Diagnosis not present

## 2018-08-01 LAB — CMP (CANCER CENTER ONLY)
ALT: 27 U/L (ref 0–44)
AST: 26 U/L (ref 15–41)
Albumin: 4 g/dL (ref 3.5–5.0)
Alkaline Phosphatase: 95 U/L (ref 38–126)
Anion gap: 10 (ref 5–15)
BUN: 13 mg/dL (ref 8–23)
CO2: 23 mmol/L (ref 22–32)
Calcium: 10.2 mg/dL (ref 8.9–10.3)
Chloride: 108 mmol/L (ref 98–111)
Creatinine: 0.99 mg/dL (ref 0.44–1.00)
GFR, Est AFR Am: >60 mL/min (ref >60)
GFR, Estimated: 55 mL/min — ABNORMAL LOW (ref >60)
Glucose, Bld: 104 mg/dL — ABNORMAL HIGH (ref 70–99)
POTASSIUM: 3.9 mmol/L (ref 3.5–5.1)
Sodium: 141 mmol/L (ref 135–145)
Total Bilirubin: 1.8 mg/dL — ABNORMAL HIGH (ref 0.3–1.2)
Total Protein: 6.8 g/dL (ref 6.5–8.1)

## 2018-08-01 LAB — CBC WITH DIFFERENTIAL/PLATELET
Abs Immature Granulocytes: 0.02 10*3/uL (ref 0.00–0.07)
Basophils Absolute: 0.1 10*3/uL (ref 0.0–0.1)
Basophils Relative: 2 %
Eosinophils Absolute: 0.2 10*3/uL (ref 0.0–0.5)
Eosinophils Relative: 5 %
HCT: 42.8 % (ref 36.0–46.0)
Hemoglobin: 14.4 g/dL (ref 12.0–15.0)
Immature Granulocytes: 0 %
Lymphocytes Relative: 37 %
Lymphs Abs: 1.7 10*3/uL (ref 0.7–4.0)
MCH: 29.1 pg (ref 26.0–34.0)
MCHC: 33.6 g/dL (ref 30.0–36.0)
MCV: 86.6 fL (ref 80.0–100.0)
Monocytes Absolute: 0.5 10*3/uL (ref 0.1–1.0)
Monocytes Relative: 11 %
Neutro Abs: 2.1 10*3/uL (ref 1.7–7.7)
Neutrophils Relative %: 45 %
Platelets: 142 10*3/uL — ABNORMAL LOW (ref 150–400)
RBC: 4.94 MIL/uL (ref 3.87–5.11)
RDW: 15.1 % (ref 11.5–15.5)
WBC: 4.7 10*3/uL (ref 4.0–10.5)
nRBC: 0 % (ref 0.0–0.2)

## 2018-08-01 MED ORDER — ACETAMINOPHEN 325 MG PO TABS
650.0000 mg | ORAL_TABLET | Freq: Once | ORAL | Status: AC
  Administered 2018-08-01: 650 mg via ORAL

## 2018-08-01 MED ORDER — SODIUM CHLORIDE 0.9 % IV SOLN
70.0000 mg/m2 | Freq: Once | INTRAVENOUS | Status: AC
  Administered 2018-08-01: 125 mg via INTRAVENOUS
  Filled 2018-08-01: qty 5

## 2018-08-01 MED ORDER — SODIUM CHLORIDE 0.9% FLUSH
10.0000 mL | INTRAVENOUS | Status: DC | PRN
  Administered 2018-08-01: 10 mL
  Filled 2018-08-01: qty 10

## 2018-08-01 MED ORDER — PALONOSETRON HCL INJECTION 0.25 MG/5ML
0.2500 mg | Freq: Once | INTRAVENOUS | Status: AC
  Administered 2018-08-01: 0.25 mg via INTRAVENOUS

## 2018-08-01 MED ORDER — SODIUM CHLORIDE 0.9 % IV SOLN
10.0000 mg | Freq: Once | INTRAVENOUS | Status: AC
  Administered 2018-08-01: 10 mg via INTRAVENOUS
  Filled 2018-08-01: qty 1

## 2018-08-01 MED ORDER — SODIUM CHLORIDE 0.9 % IV SOLN
Freq: Once | INTRAVENOUS | Status: AC
  Administered 2018-08-01: 10:00:00 via INTRAVENOUS
  Filled 2018-08-01: qty 250

## 2018-08-01 MED ORDER — DEXAMETHASONE SODIUM PHOSPHATE 10 MG/ML IJ SOLN: 10.0000 mg | Freq: Once | INTRAMUSCULAR | Status: DC

## 2018-08-01 MED ORDER — SODIUM CHLORIDE 0.9 % IV SOLN
375.0000 mg/m2 | Freq: Once | INTRAVENOUS | Status: AC
  Administered 2018-08-01: 700 mg via INTRAVENOUS
  Filled 2018-08-01: qty 50

## 2018-08-01 MED ORDER — DIPHENHYDRAMINE HCL 25 MG PO CAPS
50.0000 mg | ORAL_CAPSULE | Freq: Once | ORAL | Status: AC
  Administered 2018-08-01: 50 mg via ORAL

## 2018-08-01 MED ORDER — HEPARIN SOD (PORK) LOCK FLUSH 100 UNIT/ML IV SOLN
500.0000 [IU] | Freq: Once | INTRAVENOUS | Status: AC | PRN
  Administered 2018-08-01: 500 [IU]
  Filled 2018-08-01: qty 5

## 2018-08-01 MED ORDER — PALONOSETRON HCL INJECTION 0.25 MG/5ML
INTRAVENOUS | Status: AC
  Filled 2018-08-01: qty 5

## 2018-08-01 MED ORDER — SODIUM CHLORIDE 0.9 % IV SOLN: 10.0000 mg | Freq: Once | INTRAVENOUS | Status: DC

## 2018-08-01 MED ORDER — ACETAMINOPHEN 325 MG PO TABS
ORAL_TABLET | ORAL | Status: AC
  Filled 2018-08-01: qty 2

## 2018-08-01 MED ORDER — DIPHENHYDRAMINE HCL 25 MG PO CAPS
ORAL_CAPSULE | ORAL | Status: AC
  Filled 2018-08-01: qty 2

## 2018-08-01 NOTE — Patient Instructions (Signed)
[facility] Discharge Instructions for Patients Receiving Chemotherapy  Today you received the following chemotherapy agents Rituximab (RITUXAN) & Bendamustine (BENDEKA).  To help prevent nausea and vomiting after your treatment, we encourage you to take your nausea medication as prescribed.  If you develop nausea and vomiting that is not controlled by your nausea medication, call the clinic.   BELOW ARE SYMPTOMS THAT SHOULD BE REPORTED IMMEDIATELY:  *FEVER GREATER THAN 100.5 F  *CHILLS WITH OR WITHOUT FEVER  NAUSEA AND VOMITING THAT IS NOT CONTROLLED WITH YOUR NAUSEA MEDICATION  *UNUSUAL SHORTNESS OF BREATH  *UNUSUAL BRUISING OR BLEEDING  TENDERNESS IN MOUTH AND THROAT WITH OR WITHOUT PRESENCE OF ULCERS  *URINARY PROBLEMS  *BOWEL PROBLEMS  UNUSUAL RASH Items with * indicate a potential emergency and should be followed up as soon as possible.  Feel free to call the clinic should you have any questions or concerns. The clinic phone number is [phone].  Please show the [place] at check-in to the Emergency Department and triage nurse.

## 2018-08-01 NOTE — Telephone Encounter (Signed)
Gave avs and calendar ° °

## 2018-08-02 ENCOUNTER — Inpatient Hospital Stay: Payer: Medicare Other

## 2018-08-02 VITALS — BP 125/55 | HR 72 | Temp 97.7°F | Resp 17

## 2018-08-02 DIAGNOSIS — Z9012 Acquired absence of left breast and nipple: Secondary | ICD-10-CM | POA: Diagnosis not present

## 2018-08-02 DIAGNOSIS — R61 Generalized hyperhidrosis: Secondary | ICD-10-CM | POA: Diagnosis not present

## 2018-08-02 DIAGNOSIS — N3010 Interstitial cystitis (chronic) without hematuria: Secondary | ICD-10-CM | POA: Diagnosis not present

## 2018-08-02 DIAGNOSIS — I2510 Atherosclerotic heart disease of native coronary artery without angina pectoris: Secondary | ICD-10-CM | POA: Diagnosis not present

## 2018-08-02 DIAGNOSIS — K219 Gastro-esophageal reflux disease without esophagitis: Secondary | ICD-10-CM | POA: Diagnosis not present

## 2018-08-02 DIAGNOSIS — Z5112 Encounter for antineoplastic immunotherapy: Secondary | ICD-10-CM | POA: Diagnosis not present

## 2018-08-02 DIAGNOSIS — M797 Fibromyalgia: Secondary | ICD-10-CM | POA: Diagnosis not present

## 2018-08-02 DIAGNOSIS — Z7189 Other specified counseling: Secondary | ICD-10-CM

## 2018-08-02 DIAGNOSIS — C8231 Follicular lymphoma grade IIIa, lymph nodes of head, face, and neck: Secondary | ICD-10-CM | POA: Diagnosis not present

## 2018-08-02 DIAGNOSIS — Z79899 Other long term (current) drug therapy: Secondary | ICD-10-CM | POA: Diagnosis not present

## 2018-08-02 DIAGNOSIS — Z791 Long term (current) use of non-steroidal anti-inflammatories (NSAID): Secondary | ICD-10-CM | POA: Diagnosis not present

## 2018-08-02 DIAGNOSIS — Z86000 Personal history of in-situ neoplasm of breast: Secondary | ICD-10-CM | POA: Diagnosis not present

## 2018-08-02 DIAGNOSIS — C8238 Follicular lymphoma grade IIIa, lymph nodes of multiple sites: Secondary | ICD-10-CM

## 2018-08-02 DIAGNOSIS — Z7982 Long term (current) use of aspirin: Secondary | ICD-10-CM | POA: Diagnosis not present

## 2018-08-02 DIAGNOSIS — Z5111 Encounter for antineoplastic chemotherapy: Secondary | ICD-10-CM | POA: Diagnosis not present

## 2018-08-02 MED ORDER — SODIUM CHLORIDE 0.9 % IV SOLN
10.0000 mg | Freq: Once | INTRAVENOUS | Status: AC
  Administered 2018-08-02: 10 mg via INTRAVENOUS
  Filled 2018-08-02: qty 1

## 2018-08-02 MED ORDER — HEPARIN SOD (PORK) LOCK FLUSH 100 UNIT/ML IV SOLN
500.0000 [IU] | Freq: Once | INTRAVENOUS | Status: AC | PRN
  Administered 2018-08-02: 500 [IU]
  Filled 2018-08-02: qty 5

## 2018-08-02 MED ORDER — SODIUM CHLORIDE 0.9 % IV SOLN
70.0000 mg/m2 | Freq: Once | INTRAVENOUS | Status: AC
  Administered 2018-08-02: 125 mg via INTRAVENOUS
  Filled 2018-08-02: qty 5

## 2018-08-02 MED ORDER — SODIUM CHLORIDE 0.9% FLUSH
10.0000 mL | INTRAVENOUS | Status: DC | PRN
  Administered 2018-08-02: 10 mL
  Filled 2018-08-02: qty 10

## 2018-08-02 MED ORDER — SODIUM CHLORIDE 0.9 % IV SOLN
Freq: Once | INTRAVENOUS | Status: AC
  Administered 2018-08-02: 11:00:00 via INTRAVENOUS
  Filled 2018-08-02: qty 250

## 2018-08-02 NOTE — Patient Instructions (Signed)
[facility] Discharge Instructions for Patients Receiving Chemotherapy  Today you received the following chemotherapy agents Bendeka.   To help prevent nausea and vomiting after your treatment, we encourage you to take your nausea medication as directed.    If you develop nausea and vomiting that is not controlled by your nausea medication, call the clinic.   BELOW ARE SYMPTOMS THAT SHOULD BE REPORTED IMMEDIATELY:  *FEVER GREATER THAN 100.5 F  *CHILLS WITH OR WITHOUT FEVER  NAUSEA AND VOMITING THAT IS NOT CONTROLLED WITH YOUR NAUSEA MEDICATION  *UNUSUAL SHORTNESS OF BREATH  *UNUSUAL BRUISING OR BLEEDING  TENDERNESS IN MOUTH AND THROAT WITH OR WITHOUT PRESENCE OF ULCERS  *URINARY PROBLEMS  *BOWEL PROBLEMS  UNUSUAL RASH Items with * indicate a potential emergency and should be followed up as soon as possible.  Feel free to call the clinic you have any questions or concerns. The clinic phone number is [phone].  Please show the CHEMO ALERT CARD at check-in to the Emergency Department and triage nurse.   

## 2018-08-03 NOTE — Progress Notes (Signed)
Oncology Nurse Navigator Documentation  Met briefly with Rachael Greer in Infusion where she was receiving bendustamine infusion to check on her well-being, provide emotional support. She denied concerns re this week's infusions, denied concerns. She recognized having PAC was "wonderfull" in context of historical difficulty with peripheral IV insertions. I encouraged her to call me with needs/concerns as she progresses with tmt.  She agreed.  [name], RN, BSN Head & Neck Oncology Nurse [place] at [place] [phone]

## 2018-08-05 ENCOUNTER — Telehealth: Payer: Self-pay | Admitting: Hematology

## 2018-08-05 ENCOUNTER — Telehealth: Payer: Self-pay | Admitting: *Deleted

## 2018-08-05 NOTE — Telephone Encounter (Signed)
Ms. [name], caregiver/friend (on [place]) called to clarify patient's upcoming appts. Gave her dates and times.  Ms. Fabio Neighbors reported patient Experienced urinary incontinence once on Saturday and once on  Sunday. Had a nose bleed this am. Patient had infusions last Thursday/Friday. Advised her that Dr. Irene Limbo will be informed of events. She asked that she be contacted with any follow up advice [phone] as patient is resting.

## 2018-08-05 NOTE — Telephone Encounter (Signed)
Notified by scheduling that patient feels better by now and will see PCP on Wednesday. Appt with [facility] not needed at this time.

## 2018-08-05 NOTE — Telephone Encounter (Signed)
Per Dr. Irene Limbo, ask if patient wants to be evaluated at [facility] in [facility] tomorrow. Contacted Ms. Honeycutt, who states they would appreciate an appointment. Sent msg to scheduling to contact Ms. Honeycutt with appt time.

## 2018-08-05 NOTE — Telephone Encounter (Signed)
Called pt per 2/24 sch message - per caregiver appt for [facility] not needed at the moment / pt has a PCP appt on 2/26

## 2018-08-07 DIAGNOSIS — C8231 Follicular lymphoma grade IIIa, lymph nodes of head, face, and neck: Secondary | ICD-10-CM | POA: Diagnosis not present

## 2018-08-07 DIAGNOSIS — R413 Other amnesia: Secondary | ICD-10-CM | POA: Diagnosis not present

## 2018-08-07 DIAGNOSIS — N390 Urinary tract infection, site not specified: Secondary | ICD-10-CM | POA: Diagnosis not present

## 2018-08-07 DIAGNOSIS — R0609 Other forms of dyspnea: Secondary | ICD-10-CM | POA: Diagnosis not present

## 2018-08-15 DIAGNOSIS — R0609 Other forms of dyspnea: Secondary | ICD-10-CM | POA: Diagnosis not present

## 2018-08-22 ENCOUNTER — Encounter (HOSPITAL_COMMUNITY)
Admission: RE | Admit: 2018-08-22 | Discharge: 2018-08-22 | Disposition: A | Discharge status: Home / Self Care | Payer: Medicare Other | Source: Ambulatory Visit | Attending: Hematology | Admitting: Hematology

## 2018-08-22 ENCOUNTER — Other Ambulatory Visit: Payer: Self-pay

## 2018-08-22 DIAGNOSIS — C8238 Follicular lymphoma grade IIIa, lymph nodes of multiple sites: Secondary | ICD-10-CM | POA: Diagnosis not present

## 2018-08-22 DIAGNOSIS — I700 Atherosclerosis of aorta: Secondary | ICD-10-CM | POA: Insufficient documentation

## 2018-08-22 DIAGNOSIS — I2510 Atherosclerotic heart disease of native coronary artery without angina pectoris: Secondary | ICD-10-CM | POA: Insufficient documentation

## 2018-08-22 DIAGNOSIS — C8230 Follicular lymphoma grade IIIa, unspecified site: Secondary | ICD-10-CM | POA: Diagnosis not present

## 2018-08-22 DIAGNOSIS — R911 Solitary pulmonary nodule: Secondary | ICD-10-CM | POA: Diagnosis not present

## 2018-08-22 LAB — GLUCOSE, CAPILLARY: GLUCOSE-CAPILLARY: 96 mg/dL (ref 70–99)

## 2018-08-22 MED ORDER — FLUDEOXYGLUCOSE F - 18 (FDG) INJECTION
7.8000 | Freq: Once | INTRAVENOUS | Status: AC | PRN
  Administered 2018-08-22: 7.8 via INTRAVENOUS

## 2018-08-27 ENCOUNTER — Telehealth: Payer: Self-pay | Admitting: *Deleted

## 2018-08-27 NOTE — Telephone Encounter (Signed)
Oncology Nurse Navigator Documentation  Rec'd call from Ms. Gordon asking about appts later this week.  I assured her precautions are being implemented to keep patients safe, reviewed appt times for Thursday and Friday.  [name], RN, BSN Head & Neck Oncology Nurse [place] at [place] [phone]

## 2018-08-28 NOTE — Progress Notes (Signed)
HEMATOLOGY/ONCOLOGY CONSULTATION NOTE  Date of Service: 08/29/2018  Patient Care Team: [name], MD as PCP - General (Internal Medicine) Stanford Breed [name], MD as PCP - Cardiology (Cardiology) [name], MD as Consulting Physician (Hematology) [name], RN as Oncology Nurse Navigator (Oncology)  CHIEF COMPLAINTS/PURPOSE OF CONSULTATION:  Follicular lymphoma grade 3a  HISTORY OF PRESENTING ILLNESS:   Rachael Greer is a wonderful 78 y.o. female who has been referred to [name] by Dr. 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 She is accompanied today by a friend, and our Nurse Navigator [name]. The pt reports that she is doing well overall.   The pt reports that things began changing last September in 2018. She notes that she has taken Flonase, Claritin, and saline solution and did not have a runny nose, and did not have any breathing difficulty. She notes that her head felt as though she had a flu or a cold, without any other symptoms. She notes that she felt as if her ears were swollen and has had some minimal hearing impairment, and has myringotomy tubes. She began a steroid course about 4 months ago with Dr. [name]. She notes that she first noticed right sided neck swelling in the last month, and left sided neck swelling which began 2-3 weeks ago.   She obtained a CT Neck on 03/08/18, as noted below.   The pt notes that she has not had fevers or chills, but has woken up recently with night sweats occasionally over the last 6 months. She adds that she has noticed some SOB in the last 7-8 months which presents intermittently, and has not progressed. The pt notes that she has had some difficulty swallowing in the past, and saw Dr. [name] for concerns of a stricture, and adds that this she has had recent difficulty swallowing. The pt also notes that she has recently felt dizziness, described as the room spinning. She has not had her dizziness worked  up.  The pt notes that she had a mastectomy without radiation or chemotherapy for breast cancer in situ in 2013. She began tamoxifen preventatively afterwards for 2.5 years.   The pt notes that her interstitial cystitis cause is unknown. She notes that she took a TB vaccine 5-7 years ago, and developed severe pain and sepsis. She notes that she has not had recurrent UTIs and takes Elmiron under the care of Dr. [name] in urology. She notes that there are certain foods that she cannot eat without developing bladder pain. She also denies blood in the urine.  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 2. RIGHT lymphadenitis seen with superimposed infection or pericapsular spread of tumor.  Most recent lab results (03/08/18) of CBC w/diff and BMP is as follows: all values are WNL except for RBC at 5.22, HGB at 15.3.  On review of systems, pt reports some night sweats, some SOB, right neck swelling, left neck swelling, dizziness, hearing impairment, healed incision, right ear numbness, and denies fevers, chills, runny nose, abdominal pains, abdominal distension, blood in the urine, sinus pressure or pain, pain along the spine, leg swelling, skin rashes, and any other symptoms.   On PMHx the pt reports breast cancer in 2013, sternocleidomastoid release in childhood, interstitial cystitis, sepsis. On Social Hx the pt denies ever  smoking cigarettes and denies ever consuming alcohol  Interval History:  Rachael Greer returns today for management and evaluation of her Grade 3a Follicular Lymphoma, and prior to C4 BR. The patient's last visit with [name] was on 08/01/18. She is accompanied today by her close friend. The pt reports that she is doing well overall.   The pt reports  that she has been having increased memory issues, which her friend and caregiver endorses as well. The pt previously took Aracept, but stopped this because it made her "upset, and confused." She notes that she hasn't been very active. She also notes that she has had a mild, dull headache for the last two days. She notes that she has been eating well and has gained some weights.  Of note since the patient's last visit, pt has had a PET/CT completed on 08/22/18 with results revealing Interval resolution of bilateral hypermetabolic cervical and posterior nasopharyngeal adenopathy. No evidence for residual or recurrent metabolically active tumor. 2. Aortic Atherosclerosis. Lad coronary artery calcifications. 3. New nonspecific 5 mm subpleural nodule is noted within the posteromedial left upper lobe.  Lab results today (08/29/18) of CBC w/diff and CMP is as follows: all values are WNL except for WBC at 3.2k, PLT at 121k, ANC at 1.6k, Total Protein at 6.4, Total Bilirubin at 1.8. 08/29/18 LDH at 237  On review of systems, pt reports increased confusion, weight gain, eating well, and denies concerns for infections, abdominal pains, and any other symptoms.  MEDICAL HISTORY:  Past Medical History:  Diagnosis Date   Anxiety    Breast cancer ([place])    Chest pain    Depression    DOE (dyspnea on exertion)    Eustachian tube obstruction, bilateral    Fibromyalgia    Gastric reflux    History of kidney stones    Hyperlipidemia    Hypertension    IBS (irritable bowel syndrome)    Interstitial cystitis    Nephrolithiasis    Nephrolithiasis    Palpitations    Pre-diabetes    Sepsis ([place])     SURGICAL HISTORY: Past Surgical History:  Procedure Laterality Date   BREAST DUCTAL SYSTEM EXCISION Right 10/30/2016   Procedure: RIGHT BREAST DUCT EXCISION;  Surgeon: Rolm Bookbinder, MD;  Location: [facility];  Service: General;  Laterality: Right;   EYE SURGERY     bilateral lense placements    IR IMAGING GUIDED PORT INSERTION  07/12/2018   MASTECTOMY Left    NECK SURGERY     muscles cut in her neck   NEPHROSTOMY     TONSILLECTOMY      SOCIAL HISTORY: Social History   Socioeconomic History   Marital status: Widowed    Spouse name: Not on file   Number of children: 4   Years of education: Not on file   Highest education level: Not on file  Occupational History   Not on file  Social Needs   Financial resource strain: Not on file   Food insecurity:    Worry: Not on file    Inability: Not on file   Transportation needs:    Medical: Not on file    Non-medical: Not on file  Tobacco Use   Smoking status: Never Smoker   Smokeless tobacco: Never Used  Substance and Sexual Activity   Alcohol use: No   Drug use: No   Sexual activity: Not on file  Lifestyle   Physical activity:    Days per week: Not on file  Minutes per session: Not on file   Stress: Not on file  Relationships   Social connections:    Talks on phone: Not on file    Gets together: Not on file    Attends religious service: Not on file    Active member of club or organization: Not on file    Attends meetings of clubs or organizations: Not on file    Relationship status: Not on file   Intimate partner violence:    Fear of current or ex partner: Not on file    Emotionally abused: Not on file    Physically abused: Not on file    Forced sexual activity: Not on file  Other Topics Concern   Not on file  Social History Narrative   Not on file    FAMILY HISTORY: Family History  Problem Relation Age of Onset   CAD Father    Heart attack Father    Dementia Mother     ALLERGIES:  is allergic to amoxicillin and no known allergies.  MEDICATIONS:  Current Outpatient Medications  Medication Sig Dispense Refill   amLODipine (NORVASC) 2.5 MG tablet Take 2.5 mg by mouth daily.     aspirin EC 81 MG tablet Take 1 tablet (81 mg total) by mouth daily. 90 tablet 3    atorvastatin (LIPITOR) 20 MG tablet Take 1 tablet (20 mg total) by mouth daily at 6 PM. 90 tablet 3   clonazePAM (KLONOPIN) 1 MG tablet Take 1 mg by mouth 2 (two) times daily as needed for anxiety.      Ibuprofen-Diphenhydramine HCl (ADVIL PM) 200-25 MG CAPS Take 0.5-1 tablets by mouth daily as needed (sleep).      lidocaine-prilocaine (EMLA) cream Apply 1 application topically as needed. 30 g 0   metoprolol succinate (TOPROL-XL) 25 MG 24 hr tablet Take 25 mg by mouth every evening.     omeprazole (PRILOSEC) 40 MG capsule Take 40 mg by mouth daily.     ondansetron (ZOFRAN) 8 MG tablet Take 1 tablet (8 mg total) by mouth 2 (two) times daily as needed for nausea or vomiting. 40 tablet 1   pentosan polysulfate (ELMIRON) 100 MG capsule Take 100 mg by mouth 3 (three) times daily.      Polyethyl Glycol-Propyl Glycol (SYSTANE OP) Apply 1 drop to eye 2 (two) times daily.     prochlorperazine (COMPAZINE) 10 MG tablet TAKE 1 TABLET(10 MG) BY MOUTH EVERY 6 HOURS AS NEEDED FOR NAUSEA OR VOMITING 30 tablet 0   No current facility-administered medications for this visit.     REVIEW OF SYSTEMS:    A 10+ POINT REVIEW OF SYSTEMS WAS OBTAINED including neurology, dermatology, psychiatry, cardiac, respiratory, lymph, extremities, GI, GU, Musculoskeletal, constitutional, breasts, reproductive, HEENT.  All pertinent positives are noted in the HPI.  All others are negative.   PHYSICAL EXAMINATION: ECOG PERFORMANCE STATUS: 2 - Symptomatic, <50% confined to bed  Vitals:   08/29/18 0855  BP: 119/60  Pulse: 68  Resp: 17  Temp: 97.8 F (36.6 C)  SpO2: 100%   Filed Weights   08/29/18 0855  Weight: 158 lb 1.6 oz (71.7 kg)   .Body mass index is 28.01 kg/m.  GENERAL:alert, in no acute distress and comfortable SKIN: no acute rashes, no significant lesions EYES: conjunctiva are pink and non-injected, sclera anicteric OROPHARYNX: MMM, no exudates, no oropharyngeal erythema or ulceration NECK: supple,  no JVD LYMPH: no palpable lymphadenopathy in the cervical, axillary or inguinal regions LUNGS: clear to auscultation b/l  with normal respiratory effort HEART: regular rate & rhythm ABDOMEN:  normoactive bowel sounds , non tender, not distended. No palpable hepatosplenomegaly.  Extremity: no pedal edema PSYCH: alert & oriented x 3 with fluent speech NEURO: no focal motor/sensory deficits   LABORATORY DATA:  I have reviewed the data as listed  . CBC Latest Ref Rng & Units 08/29/2018 08/01/2018 07/12/2018  WBC 4.0 - 10.5 K/uL 3.2(L) 4.7 5.1  Hemoglobin 12.0 - 15.0 g/dL 13.0 14.4 12.9  Hematocrit 36.0 - 46.0 % 38.0 42.8 38.7  Platelets 150 - 400 K/uL 121(L) 142(L) 128(L)   . CBC    Component Value Date/Time   WBC 3.2 (L) 08/29/2018 0822   RBC 4.41 08/29/2018 0822   HGB 13.0 08/29/2018 0822   HGB 16.3 (H) 11/02/2017 1529   HCT 38.0 08/29/2018 0822   HCT 46.7 (H) 11/02/2017 1529   PLT 121 (L) 08/29/2018 0822   PLT 192 11/02/2017 1529   MCV 86.2 08/29/2018 0822   MCV 85 11/02/2017 1529   MCH 29.5 08/29/2018 0822   MCHC 34.2 08/29/2018 0822   RDW 13.9 08/29/2018 0822   RDW 13.9 11/02/2017 1529   LYMPHSABS 0.8 08/29/2018 0822   LYMPHSABS 2.1 11/02/2017 1529   MONOABS 0.5 08/29/2018 0822   EOSABS 0.2 08/29/2018 0822   EOSABS 0.1 11/02/2017 1529   BASOSABS 0.1 08/29/2018 0822   BASOSABS 0.1 11/02/2017 1529     . CMP Latest Ref Rng & Units 08/29/2018 08/01/2018 07/04/2018  Glucose 70 - 99 mg/dL 90 104(H) 93  BUN 8 - 23 mg/dL 13 13 14   Creatinine 0.44 - 1.00 mg/dL 0.77 0.99 0.77  Sodium 135 - 145 mmol/L 142 141 142  Potassium 3.5 - 5.1 mmol/L 4.0 3.9 3.8  Chloride 98 - 111 mmol/L 109 108 110  CO2 22 - 32 mmol/L 24 23 22   Calcium 8.9 - 10.3 mg/dL 9.9 10.2 10.3  Total Protein 6.5 - 8.1 g/dL 6.4(L) 6.8 6.8  Total Bilirubin 0.3 - 1.2 mg/dL 1.8(H) 1.8(H) 1.8(H)  Alkaline Phos 38 - 126 U/L 82 95 99  AST 15 - 41 U/L 23 26 21   ALT 0 - 44 U/L 31 27 31     03/22/18 Flow  Cytometry:   03/22/18 Biopsy:     RADIOGRAPHIC STUDIES: I have personally reviewed the radiological images as listed and agreed with the findings in the report. Nm Pet Image Restag (ps) Skull Base To Thigh  Result Date: 08/22/2018 CLINICAL DATA:  Subsequent treatment strategy for grade 3A follicular lymphoma. EXAM: NUCLEAR MEDICINE PET SKULL BASE TO THIGH TECHNIQUE: 7.8 mCi F-18 FDG was injected intravenously. Full-ring PET imaging was performed from the skull base to thigh after the radiotracer. CT data was obtained and used for attenuation correction and anatomic localization. Fasting blood glucose: 97 mg/dl COMPARISON:  04/17/2018 FINDINGS: Mediastinal blood pool activity: SUV max 3.08 Liver activity: SUV max 4.43 NECK: No hypermetabolic lymph nodes in the neck. Incidental CT findings: none CHEST: No hypermetabolic axillary, supraclavicular, mediastinal, or hilar lymph nodes. There is a small subpleural nodule within the posteromedial left upper lobe measuring 5 mm. This is new from previous exam, nonspecific. No associated uptake. Incidental CT findings: Aortic atherosclerosis. Calcification in the LAD coronary artery noted. ABDOMEN/PELVIS: No abnormal hypermetabolic activity within the liver, pancreas, adrenal glands, or spleen. No hypermetabolic lymph nodes in the abdomen or pelvis. Incidental CT findings: Exophytic fluid density lesion within the anterior spleen is unchanged measuring 3.1 cm without corresponding increased uptake. Aortic atherosclerosis. Small left  renal calculi are identified. SKELETON: No focal hypermetabolic activity to suggest skeletal metastasis. Incidental CT findings: none IMPRESSION: 1. Interval resolution of bilateral hypermetabolic cervical and posterior nasopharyngeal adenopathy. No evidence for residual or recurrent metabolically active tumor. 2. Aortic Atherosclerosis (ICD10-I70.0). Lad coronary artery calcifications. 3. New nonspecific 5 mm subpleural nodule is noted  within the posteromedial left upper lobe. Electronically Signed   By: Kerby Moors M.D.   On: 08/22/2018 15:47    ASSESSMENT & PLAN:   78 y.o. female with  1. 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 2. RIGHT lymphadenitis seen with superimposed infection or pericapsular spread of tumor.    03/22/18 biopsy revealed follicular lymphoma, Grade 3a   04/04/18 Hep B and Hep C negative   04/17/18 PET/CT revealed Notable bilateral adenopathy in the neck and posterior nasopharynx is hypermetabolic and mostly [place] 5 level activity, compatible with malignancy. 2. No pathologically enlarged or significantly hypermetabolic adenopathy in the chest, or abdomen/pelvis. No splenomegaly. Slight enlargement of the photopenic splenic cyst noted on remote prior Exams. 3. Other imaging findings of potential clinical significance: Aortic Atherosclerosis. Coronary atherosclerosis. Diffuse hepatic steatosis. Nonobstructive left nephrolithiasis.   2. Interstitial cystitis - stable.  PLAN:  -Discussed pt labwork today, 08/29/18; blood counts and chemistries are stable. LDH at 237. -Discussed the 08/22/18 PET/CT which revealed Interval resolution of bilateral hypermetabolic cervical and posterior nasopharyngeal adenopathy. No evidence for residual or recurrent metabolically active tumor. 2. Aortic Atherosclerosis. Lad coronary artery calcifications. 3. New nonspecific 5 mm subpleural nodule is noted within the posteromedial left upper lobe. -Pt has completed 3 cycles of BR, and one cycle of Rituxan alone.  -Discussed that typically we would complete 2 additional cycles after full response, however the pt does have interstitial cystitis and endorses worsening memory changes and functional status. Discussed that in light of this,  I would give her three options of either more conservative watchful observation vs maintenance Rituxan vs more aggressive completing 2 final cycles of BR. -After discussion, the pt prefers to stop induction treatment, and return in 2 months for further conversation and considerations about maintenance Rituxan -No roll for local radiation -The pt has no prohibitive toxicities from dose reduced 70mg /m2 Bendamustine and Rituxan -Chose not to treat with R-CHOP given interstitial cystitis. -Continue follow up with Urology Dr. Alona Bene -Continue follow up with PCP for dementia work up -Continue Vitamin B complex -Advised crowd avoidance and infection prevention strategies -Will see the pt back in 2 months   Please cancel all currently scheduled treatments RTC with Dr Irene Limbo with labs and port flush in 48months    All of the patients questions were answered with apparent satisfaction. The patient knows to call the clinic with any problems, questions or concerns.  The total time spent in the appt was 30 minutes and more than 50% was on counseling and direct patient cares.    Sullivan Lone MD MS AAHIVMS [facility] Hematology/Oncology Physician [facility]  (Office):       [phone] (Work cell):  [phone] (Fax):           [phone]  08/29/2018 10:15 AM  I, Baldwin [place], am acting as a scribe for Dr. Sullivan Lone.   .I have reviewed the above documentation for accuracy and completeness, and I agree with the above. [name] MD

## 2018-08-29 ENCOUNTER — Other Ambulatory Visit: Payer: Self-pay

## 2018-08-29 ENCOUNTER — Inpatient Hospital Stay: Payer: Medicare Other

## 2018-08-29 ENCOUNTER — Telehealth: Payer: Self-pay | Admitting: Hematology

## 2018-08-29 ENCOUNTER — Inpatient Hospital Stay: Payer: Medicare Other | Attending: Hematology | Admitting: Hematology

## 2018-08-29 VITALS — BP 119/60 | HR 68 | Temp 97.8°F | Resp 17 | Ht 63.0 in | Wt 158.1 lb

## 2018-08-29 DIAGNOSIS — Z9221 Personal history of antineoplastic chemotherapy: Secondary | ICD-10-CM | POA: Diagnosis not present

## 2018-08-29 DIAGNOSIS — R0609 Other forms of dyspnea: Secondary | ICD-10-CM | POA: Diagnosis not present

## 2018-08-29 DIAGNOSIS — K219 Gastro-esophageal reflux disease without esophagitis: Secondary | ICD-10-CM | POA: Insufficient documentation

## 2018-08-29 DIAGNOSIS — Z79899 Other long term (current) drug therapy: Secondary | ICD-10-CM | POA: Diagnosis not present

## 2018-08-29 DIAGNOSIS — M797 Fibromyalgia: Secondary | ICD-10-CM | POA: Insufficient documentation

## 2018-08-29 DIAGNOSIS — C8238 Follicular lymphoma grade IIIa, lymph nodes of multiple sites: Secondary | ICD-10-CM

## 2018-08-29 DIAGNOSIS — F329 Major depressive disorder, single episode, unspecified: Secondary | ICD-10-CM | POA: Insufficient documentation

## 2018-08-29 DIAGNOSIS — I10 Essential (primary) hypertension: Secondary | ICD-10-CM | POA: Diagnosis not present

## 2018-08-29 DIAGNOSIS — Z853 Personal history of malignant neoplasm of breast: Secondary | ICD-10-CM | POA: Diagnosis not present

## 2018-08-29 DIAGNOSIS — E785 Hyperlipidemia, unspecified: Secondary | ICD-10-CM | POA: Insufficient documentation

## 2018-08-29 DIAGNOSIS — K589 Irritable bowel syndrome without diarrhea: Secondary | ICD-10-CM | POA: Insufficient documentation

## 2018-08-29 DIAGNOSIS — Z95828 Presence of other vascular implants and grafts: Secondary | ICD-10-CM

## 2018-08-29 DIAGNOSIS — Z9010 Acquired absence of unspecified breast and nipple: Secondary | ICD-10-CM | POA: Insufficient documentation

## 2018-08-29 DIAGNOSIS — Z923 Personal history of irradiation: Secondary | ICD-10-CM | POA: Diagnosis not present

## 2018-08-29 LAB — CMP (CANCER CENTER ONLY)
ALT: 31 U/L (ref 0–44)
AST: 23 U/L (ref 15–41)
Albumin: 3.8 g/dL (ref 3.5–5.0)
Alkaline Phosphatase: 82 U/L (ref 38–126)
Anion gap: 9 (ref 5–15)
BUN: 13 mg/dL (ref 8–23)
CO2: 24 mmol/L (ref 22–32)
Calcium: 9.9 mg/dL (ref 8.9–10.3)
Chloride: 109 mmol/L (ref 98–111)
Creatinine: 0.77 mg/dL (ref 0.44–1.00)
GFR, Estimated: >60 mL/min (ref >60)
Glucose, Bld: 90 mg/dL (ref 70–99)
Potassium: 4 mmol/L (ref 3.5–5.1)
Sodium: 142 mmol/L (ref 135–145)
Total Bilirubin: 1.8 mg/dL — ABNORMAL HIGH (ref 0.3–1.2)
Total Protein: 6.4 g/dL — ABNORMAL LOW (ref 6.5–8.1)

## 2018-08-29 LAB — CBC WITH DIFFERENTIAL/PLATELET
Abs Immature Granulocytes: 0.01 10*3/uL (ref 0.00–0.07)
Basophils Absolute: 0.1 10*3/uL (ref 0.0–0.1)
Basophils Relative: 2 %
Eosinophils Absolute: 0.2 10*3/uL (ref 0.0–0.5)
Eosinophils Relative: 5 %
HCT: 38 % (ref 36.0–46.0)
Hemoglobin: 13 g/dL (ref 12.0–15.0)
Immature Granulocytes: 0 %
Lymphocytes Relative: 25 %
Lymphs Abs: 0.8 10*3/uL (ref 0.7–4.0)
MCH: 29.5 pg (ref 26.0–34.0)
MCHC: 34.2 g/dL (ref 30.0–36.0)
MCV: 86.2 fL (ref 80.0–100.0)
Monocytes Absolute: 0.5 10*3/uL (ref 0.1–1.0)
Monocytes Relative: 17 %
NRBC: 0 % (ref 0.0–0.2)
Neutro Abs: 1.6 10*3/uL — ABNORMAL LOW (ref 1.7–7.7)
Neutrophils Relative %: 51 %
Platelets: 121 10*3/uL — ABNORMAL LOW (ref 150–400)
RBC: 4.41 MIL/uL (ref 3.87–5.11)
RDW: 13.9 % (ref 11.5–15.5)
WBC: 3.2 10*3/uL — ABNORMAL LOW (ref 4.0–10.5)

## 2018-08-29 LAB — LACTATE DEHYDROGENASE: LDH: 237 U/L — ABNORMAL HIGH (ref 98–192)

## 2018-08-29 MED ORDER — HEPARIN SOD (PORK) LOCK FLUSH 100 UNIT/ML IV SOLN
500.0000 [IU] | Freq: Once | INTRAVENOUS | Status: AC
  Administered 2018-08-29: 500 [IU] via INTRAVENOUS
  Filled 2018-08-29: qty 5

## 2018-08-29 MED ORDER — SODIUM CHLORIDE 0.9% FLUSH
10.0000 mL | Freq: Once | INTRAVENOUS | Status: AC
  Administered 2018-08-29: 10 mL via INTRAVENOUS
  Filled 2018-08-29: qty 10

## 2018-08-29 MED ORDER — SODIUM CHLORIDE 0.9% FLUSH
10.0000 mL | INTRAVENOUS | Status: DC | PRN
  Administered 2018-08-29: 10 mL via INTRAVENOUS
  Filled 2018-08-29: qty 10

## 2018-08-29 NOTE — Telephone Encounter (Signed)
Scheduled appt per 3/19 los. ° °Printed calendar and avs. °

## 2018-08-30 ENCOUNTER — Inpatient Hospital Stay: Payer: Medicare Other

## 2018-09-16 ENCOUNTER — Encounter: Payer: Self-pay | Admitting: Neurology

## 2018-09-23 ENCOUNTER — Other Ambulatory Visit: Payer: Self-pay | Admitting: Cardiology

## 2018-09-23 MED ORDER — AMLODIPINE BESYLATE 2.5 MG PO TABS: 2.5000 mg | ORAL_TABLET | Freq: Every day | ORAL | 2 refills | Status: DC

## 2018-09-23 NOTE — Telephone Encounter (Signed)
°*  STAT* If patient is at the pharmacy, call can be transferred to refill team.   1. Which medications need to be refilled? (please list name of each medication and dose if known) Amlodipine 2. Which pharmacy/location (including street and city if local pharmacy) is medication to be sent to?  [id number] - [place], [place] - [address] AT [place] OF [address] [phone] (Phone) [phone] (Fax)     3. Do they need a 30 day or 90 day supply? 90 +2 refills (she has appt in Aug)

## 2018-09-23 NOTE — Telephone Encounter (Signed)
Sent Amlodipine to [facility] in [place], [address]

## 2018-09-25 ENCOUNTER — Ambulatory Visit: Payer: Medicare Other | Admitting: Cardiology

## 2018-09-26 ENCOUNTER — Ambulatory Visit: Payer: Medicare Other

## 2018-09-26 ENCOUNTER — Other Ambulatory Visit: Payer: Medicare Other

## 2018-09-26 ENCOUNTER — Ambulatory Visit: Payer: Medicare Other | Admitting: Hematology

## 2018-09-27 ENCOUNTER — Ambulatory Visit: Payer: Medicare Other

## 2018-10-09 ENCOUNTER — Telehealth: Payer: Self-pay | Admitting: *Deleted

## 2018-10-09 NOTE — Telephone Encounter (Signed)
Contacted caregiver [name] (comes to all appts with patient)  to advise that patient appt for flush only tomorrow can be cancelled, per Dr. Irene Limbo. Port be flushed at next appt on 5/13. She verbalized understanding.  Also advised [name] that patient stated to [facility] staff on screening call that she was experiencing some shortness of breath and had decrease in sense of taste. Per [name], she will check on patient. She states patient has had those symptoms for about a year and has discussed them with Dr. Irene Limbo. [name]'s number will be added to appt on 5/13 to call during appt, as she always comes to appts with patient.

## 2018-10-10 ENCOUNTER — Inpatient Hospital Stay: Payer: Medicare Other

## 2018-10-22 NOTE — Progress Notes (Signed)
HEMATOLOGY/ONCOLOGY CONSULTATION NOTE  Date of Service: 10/23/2018  Patient Care Team: [name], MD as PCP - General (Internal Medicine) Stanford Breed [name], MD as PCP - Cardiology (Cardiology) [name], MD as Consulting Physician (Hematology) [name], RN as Oncology Nurse Navigator (Oncology)  CHIEF COMPLAINTS/PURPOSE OF CONSULTATION:  Follicular lymphoma grade 3a  HISTORY OF PRESENTING ILLNESS:   Rachael Greer is a wonderful 78 y.o. female who has been referred to [name] by Dr. 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 She is accompanied today by a friend, and our Nurse Navigator [name]. The pt reports that she is doing well overall.   The pt reports that things began changing last September in 2018. She notes that she has taken Flonase, Claritin, and saline solution and did not have a runny nose, and did not have any breathing difficulty. She notes that her head felt as though she had a flu or a cold, without any other symptoms. She notes that she felt as if her ears were swollen and has had some minimal hearing impairment, and has myringotomy tubes. She began a steroid course about 4 months ago with Dr. [name]. She notes that she first noticed right sided neck swelling in the last month, and left sided neck swelling which began 2-3 weeks ago.   She obtained a CT Neck on 03/08/18, as noted below.   The pt notes that she has not had fevers or chills, but has woken up recently with night sweats occasionally over the last 6 months. She adds that she has noticed some SOB in the last 7-8 months which presents intermittently, and has not progressed. The pt notes that she has had some difficulty swallowing in the past, and saw Dr. [name] for concerns of a stricture, and adds that this she has had recent difficulty swallowing. The pt also notes that she has recently felt dizziness, described as the room spinning. She has not had her dizziness worked up.   The pt notes that she had a mastectomy without radiation or chemotherapy for breast cancer in situ in 2013. She began tamoxifen preventatively afterwards for 2.5 years.   The pt notes that her interstitial cystitis cause is unknown. She notes that she took a TB vaccine 5-7 years ago, and developed severe pain and sepsis. She notes that she has not had recurrent UTIs and takes Elmiron under the care of Dr. [name] in urology. She notes that there are certain foods that she cannot eat without developing bladder pain. She also denies blood in the urine.  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 2. RIGHT lymphadenitis seen with superimposed infection or pericapsular spread of tumor.  Most recent lab results (03/08/18) of CBC w/diff and BMP is as follows: all values are WNL except for RBC at 5.22, HGB at 15.3.  On review of systems, pt reports some night sweats, some SOB, right neck swelling, left neck swelling, dizziness, hearing impairment, healed incision, right ear numbness, and denies fevers, chills, runny nose, abdominal pains, abdominal distension, blood in the urine, sinus pressure or pain, pain along the spine, leg swelling, skin rashes, and any other symptoms.   On PMHx the pt reports breast cancer in 2013, sternocleidomastoid release in childhood, interstitial cystitis, sepsis. On Social Hx the pt denies ever  smoking cigarettes and denies ever consuming alcohol  Interval History:  Rachael Greer returns today for management and evaluation of her Grade 3a Follicular Lymphoma, and after completing 3 cycles of BR. The patient's last visit with [name] was on 08/29/18. The pt reports that she is doing well overall. She is accompanied today by her caregiver, [name], via  [employer].  The pt reports that she has not had any pain, but describes a sense of "fullness," which has worsened to the way she felt when she initially presented. She denies runny nose or concerns for allergies. She notes that she is able to breathe through her nose, but feels that she is hearing worse "by a couple degrees." She notes that she is not tasting food well. Previously, the pt had noted that these symptoms had improved upon completion of BR treatment.  The pt notes that she will be seeing cardiology soon for BP management. Her BP today is 123/60 in clinic.  She notes that her memory has worsened over the last 2 months, and notes that this is worse in the evenings, but occurs in the day too. She does continue on Klonopin. She will be establishing care with neurology in June and will see Dr. Ellouise Newer.  The pt notes that she is eating well and endorses a stable weight.  She notes that she has not needed to take as much Elmiron for her IC symptoms. She denies new urinary symptoms.  Lab results today (10/23/18) of CBC w/diff and CMP is as follows: all values are WNL except for PLT at 146k, Glucose at 106, Total Bilirubin at 1.5. 10/23/18 LDH is at 156  On review of systems, pt reports sense of nasal fullness, eating well, stable weight, concern for memory loss, not tasting food well, breathing well, moving her bowels well, and denies pain, fevers, chills, night sweats, runny nose, concerns for infections, noticing any new lumps or bumps, new urinary symptoms, pain over the sinuses, abdominal pains, leg swelling, and any other symptoms.   MEDICAL HISTORY:  Past Medical History:  Diagnosis Date  . Anxiety   . Breast cancer ([place])   . Chest pain   . Depression   . DOE (dyspnea on exertion)   . Eustachian tube obstruction, bilateral   . Fibromyalgia   . Gastric reflux   . History of kidney stones   . Hyperlipidemia   . Hypertension   . IBS (irritable bowel syndrome)   .  Interstitial cystitis   . Nephrolithiasis   . Nephrolithiasis   . Palpitations   . Pre-diabetes   . Sepsis ([place])     SURGICAL HISTORY: Past Surgical History:  Procedure Laterality Date  . BREAST DUCTAL SYSTEM EXCISION Right 10/30/2016   Procedure: RIGHT BREAST DUCT EXCISION;  Surgeon: Rolm Bookbinder, MD;  Location: [facility];  Service: General;  Laterality: Right;  . EYE SURGERY     bilateral lense placements  . IR IMAGING GUIDED PORT INSERTION  07/12/2018  . MASTECTOMY Left   . NECK SURGERY     muscles cut in her neck  . NEPHROSTOMY    . TONSILLECTOMY      SOCIAL HISTORY: Social History   Socioeconomic History  . Marital status: Widowed    Spouse name: Not on file  . Number of children: 4  . Years of education: Not on file  . Highest education level: Not on file  Occupational History  . Not on file  Social Needs  . Emergency planning/management officer  strain: Not on file  . Food insecurity:    Worry: Not on file    Inability: Not on file  . Transportation needs:    Medical: Not on file    Non-medical: Not on file  Tobacco Use  . Smoking status: Never Smoker  . Smokeless tobacco: Never Used  Substance and Sexual Activity  . Alcohol use: No  . Drug use: No  . Sexual activity: Not on file  Lifestyle  . Physical activity:    Days per week: Not on file    Minutes per session: Not on file  . Stress: Not on file  Relationships  . Social connections:    Talks on phone: Not on file    Gets together: Not on file    Attends religious service: Not on file    Active member of club or organization: Not on file    Attends meetings of clubs or organizations: Not on file    Relationship status: Not on file  . Intimate partner violence:    Fear of current or ex partner: Not on file    Emotionally abused: Not on file    Physically abused: Not on file    Forced sexual activity: Not on file  Other Topics Concern  . Not on file  Social History Narrative  . Not on file    FAMILY  HISTORY: Family History  Problem Relation Age of Onset  . CAD Father   . Heart attack Father   . Dementia Mother     ALLERGIES:  is allergic to amoxicillin and no known allergies.  MEDICATIONS:  Current Outpatient Medications  Medication Sig Dispense Refill  . amLODipine (NORVASC) 2.5 MG tablet Take 1 tablet (2.5 mg total) by mouth daily. 90 tablet 2  . aspirin EC 81 MG tablet Take 1 tablet (81 mg total) by mouth daily. 90 tablet 3  . atorvastatin (LIPITOR) 20 MG tablet Take 1 tablet (20 mg total) by mouth daily at 6 PM. 90 tablet 3  . clonazePAM (KLONOPIN) 1 MG tablet Take 1 mg by mouth 2 (two) times daily as needed for anxiety.     . Ibuprofen-Diphenhydramine HCl (ADVIL PM) 200-25 MG CAPS Take 0.5-1 tablets by mouth daily as needed (sleep).     Marland Kitchen lidocaine-prilocaine (EMLA) cream Apply 1 application topically as needed. 30 g 0  . metoprolol succinate (TOPROL-XL) 25 MG 24 hr tablet Take 25 mg by mouth every evening.    Marland Kitchen omeprazole (PRILOSEC) 40 MG capsule Take 40 mg by mouth daily.    . ondansetron (ZOFRAN) 8 MG tablet Take 1 tablet (8 mg total) by mouth 2 (two) times daily as needed for nausea or vomiting. 40 tablet 1  . pentosan polysulfate (ELMIRON) 100 MG capsule Take 100 mg by mouth 3 (three) times daily.     Vladimir Faster Glycol-Propyl Glycol (SYSTANE OP) Apply 1 drop to eye 2 (two) times daily.    . prochlorperazine (COMPAZINE) 10 MG tablet TAKE 1 TABLET(10 MG) BY MOUTH EVERY 6 HOURS AS NEEDED FOR NAUSEA OR VOMITING 30 tablet 0   No current facility-administered medications for this visit.     REVIEW OF SYSTEMS:    A 10+ POINT REVIEW OF SYSTEMS WAS OBTAINED including neurology, dermatology, psychiatry, cardiac, respiratory, lymph, extremities, GI, GU, Musculoskeletal, constitutional, breasts, reproductive, HEENT.  All pertinent positives are noted in the HPI.  All others are negative.   PHYSICAL EXAMINATION: ECOG PERFORMANCE STATUS: 2 - Symptomatic, <50% confined to bed  Vitals:   10/23/18 1158  BP: 123/61  Pulse: 66  Resp: 17  Temp: 97.8 F (36.6 C)  SpO2: 99%   Filed Weights   10/23/18 1158  Weight: 157 lb 4.8 oz (71.4 kg)   .Body mass index is 27.86 kg/m.  GENERAL:alert, in no acute distress and comfortable SKIN: no acute rashes, no significant lesions EYES: conjunctiva are pink and non-injected, sclera anicteric OROPHARYNX: MMM, no exudates, no oropharyngeal erythema or ulceration NECK: supple, no JVD LYMPH:  no palpable lymphadenopathy in the cervical, axillary or inguinal regions LUNGS: clear to auscultation b/l with normal respiratory effort HEART: regular rate & rhythm ABDOMEN:  normoactive bowel sounds , non tender, not distended. No palpable hepatosplenomegaly.  Extremity: no pedal edema PSYCH: alert & oriented x 3 with fluent speech NEURO: no focal motor/sensory deficits   LABORATORY DATA:  I have reviewed the data as listed  . CBC Latest Ref Rng & Units 10/23/2018 08/29/2018 08/01/2018  WBC 4.0 - 10.5 K/uL 4.5 3.2(L) 4.7  Hemoglobin 12.0 - 15.0 g/dL 14.4 13.0 14.4  Hematocrit 36.0 - 46.0 % 42.0 38.0 42.8  Platelets 150 - 400 K/uL 146(L) 121(L) 142(L)   . CBC    Component Value Date/Time   WBC 4.5 10/23/2018 1127   RBC 4.83 10/23/2018 1127   HGB 14.4 10/23/2018 1127   HGB 16.3 (H) 11/02/2017 1529   HCT 42.0 10/23/2018 1127   HCT 46.7 (H) 11/02/2017 1529   PLT 146 (L) 10/23/2018 1127   PLT 192 11/02/2017 1529   MCV 87.0 10/23/2018 1127   MCV 85 11/02/2017 1529   MCH 29.8 10/23/2018 1127   MCHC 34.3 10/23/2018 1127   RDW 12.9 10/23/2018 1127   RDW 13.9 11/02/2017 1529   LYMPHSABS 0.8 10/23/2018 1127   LYMPHSABS 2.1 11/02/2017 1529   MONOABS 0.6 10/23/2018 1127   EOSABS 0.2 10/23/2018 1127   EOSABS 0.1 11/02/2017 1529   BASOSABS 0.0 10/23/2018 1127   BASOSABS 0.1 11/02/2017 1529     . CMP Latest Ref Rng & Units 10/23/2018 08/29/2018 08/01/2018  Glucose 70 - 99 mg/dL 106(H) 90 104(H)  BUN 8 - 23 mg/dL 19 13 13    Creatinine 0.44 - 1.00 mg/dL 0.91 0.77 0.99  Sodium 135 - 145 mmol/L 142 142 141  Potassium 3.5 - 5.1 mmol/L 4.0 4.0 3.9  Chloride 98 - 111 mmol/L 108 109 108  CO2 22 - 32 mmol/L 26 24 23   Calcium 8.9 - 10.3 mg/dL 9.8 9.9 10.2  Total Protein 6.5 - 8.1 g/dL 6.8 6.4(L) 6.8  Total Bilirubin 0.3 - 1.2 mg/dL 1.5(H) 1.8(H) 1.8(H)  Alkaline Phos 38 - 126 U/L 86 82 95  AST 15 - 41 U/L 21 23 26   ALT 0 - 44 U/L 31 31 27     03/22/18 Flow Cytometry:   03/22/18 Biopsy:     RADIOGRAPHIC STUDIES: I have personally reviewed the radiological images as listed and agreed with the findings in the report. No results found.  ASSESSMENT & PLAN:   78 y.o. female with  1. 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 2. RIGHT lymphadenitis seen with superimposed infection or pericapsular spread of tumor.    03/22/18 biopsy revealed follicular lymphoma, Grade 3a   04/04/18 Hep B and Hep C negative   04/17/18 PET/CT revealed Notable bilateral adenopathy in the neck and posterior nasopharynx is hypermetabolic and mostly Deauville 5  level activity, compatible with malignancy. 2. No pathologically enlarged or significantly hypermetabolic adenopathy in the chest, or abdomen/pelvis. No splenomegaly. Slight enlargement of the photopenic splenic cyst noted on remote prior Exams. 3. Other imaging findings of potential clinical significance: Aortic Atherosclerosis. Coronary atherosclerosis. Diffuse hepatic steatosis. Nonobstructive left nephrolithiasis.   S/p C1 with Rituxan alone, then 3 cycles of combined BR completed by 08/02/18. Reduced Bendamustine to 70mg /m2 for interstitial cystitis. Chose not to treat with R-CHOP given interstitial cystitis.  08/22/18 PET/CT revealed Interval resolution of bilateral hypermetabolic cervical  and posterior nasopharyngeal adenopathy. No evidence for residual or recurrent metabolically active tumor. 2. Aortic Atherosclerosis. Lad coronary artery calcifications. 3. New nonspecific 5 mm subpleural nodule is noted within the posteromedial left upper lobe.   Discussed that typically we would complete 2 additional cycles after full response, however the pt does have interstitial cystitis and endorses worsening memory changes and functional status. Discussed that in light of this, I would give her three options of either more conservative watchful observation vs maintenance Rituxan vs more aggressive completing 2 final cycles of BR. After discussion, the pt preferred to stop induction treatment, and to return for further conversation and considerations about maintenance Rituxan  2. Interstitial cystitis - stable.  PLAN:  -Discussed pt labwork today, 10/23/18; blood counts have normalized, chemistries are stable. LDH is WNL at 156 -Recommend that pt return to care with her PCP Dr. [name] and establish care with Neurology on 12/02/18, for further work up and management of sense of memory loss -Will order CT Neck to evaluate returned sense of nasal/ear fullness -Discussed again the indications to consider completing maintenance Rituxan every 2 months, which pt will consider and which we will discuss after obtaining CT Neck and Maxillofacial -Continue follow up with Urology Dr. Alona Bene for interstitial cystitis. -Continue follow up with PCP for dementia work up -Continue Vitamin B complex -Advised crowd avoidance and infection prevention strategies -Will see the pt back in 10-12 days   CT neck and maxillofacial in 1 week Telephone visit in 10-12 days with Dr Irene Limbo    All of the patients questions were answered with apparent satisfaction. The patient knows to call the clinic with any problems, questions or concerns.  The total time spent in the appt was 27 minutes and more than 50% was  on counseling and direct patient cares.    Sullivan Lone MD MS AAHIVMS [facility] Hematology/Oncology Physician [facility]  (Office):       [phone] (Work cell):  [phone] (Fax):           [phone]  10/23/2018 1:31 PM  I, Baldwin Jamaica, am acting as a scribe for Dr. Sullivan Lone.   .I have reviewed the above documentation for accuracy and completeness, and I agree with the above. [name] MD

## 2018-10-23 ENCOUNTER — Inpatient Hospital Stay: Payer: Medicare Other | Admitting: Hematology

## 2018-10-23 ENCOUNTER — Other Ambulatory Visit: Payer: Self-pay

## 2018-10-23 ENCOUNTER — Inpatient Hospital Stay: Payer: Medicare Other

## 2018-10-23 ENCOUNTER — Inpatient Hospital Stay: Payer: Medicare Other | Attending: Hematology

## 2018-10-23 VITALS — BP 123/61 | HR 66 | Temp 97.8°F | Resp 17 | Ht 63.0 in | Wt 157.3 lb

## 2018-10-23 DIAGNOSIS — Z7982 Long term (current) use of aspirin: Secondary | ICD-10-CM | POA: Insufficient documentation

## 2018-10-23 DIAGNOSIS — R61 Generalized hyperhidrosis: Secondary | ICD-10-CM | POA: Insufficient documentation

## 2018-10-23 DIAGNOSIS — C8238 Follicular lymphoma grade IIIa, lymph nodes of multiple sites: Secondary | ICD-10-CM | POA: Insufficient documentation

## 2018-10-23 DIAGNOSIS — R439 Unspecified disturbances of smell and taste: Secondary | ICD-10-CM | POA: Diagnosis not present

## 2018-10-23 DIAGNOSIS — Z79899 Other long term (current) drug therapy: Secondary | ICD-10-CM

## 2018-10-23 DIAGNOSIS — N3010 Interstitial cystitis (chronic) without hematuria: Secondary | ICD-10-CM

## 2018-10-23 DIAGNOSIS — Z9223 Personal history of estrogen therapy: Secondary | ICD-10-CM | POA: Insufficient documentation

## 2018-10-23 DIAGNOSIS — Z853 Personal history of malignant neoplasm of breast: Secondary | ICD-10-CM | POA: Insufficient documentation

## 2018-10-23 DIAGNOSIS — Z95828 Presence of other vascular implants and grafts: Secondary | ICD-10-CM | POA: Insufficient documentation

## 2018-10-23 LAB — CMP (CANCER CENTER ONLY)
ALT: 31 U/L (ref 0–44)
AST: 21 U/L (ref 15–41)
Albumin: 3.9 g/dL (ref 3.5–5.0)
Alkaline Phosphatase: 86 U/L (ref 38–126)
Anion gap: 8 (ref 5–15)
BUN: 19 mg/dL (ref 8–23)
CO2: 26 mmol/L (ref 22–32)
Calcium: 9.8 mg/dL (ref 8.9–10.3)
Chloride: 108 mmol/L (ref 98–111)
Creatinine: 0.91 mg/dL (ref 0.44–1.00)
GFR, Est AFR Am: >60 mL/min (ref >60)
GFR, Estimated: >60 mL/min (ref >60)
Glucose, Bld: 106 mg/dL — ABNORMAL HIGH (ref 70–99)
Potassium: 4 mmol/L (ref 3.5–5.1)
Sodium: 142 mmol/L (ref 135–145)
Total Bilirubin: 1.5 mg/dL — ABNORMAL HIGH (ref 0.3–1.2)
Total Protein: 6.8 g/dL (ref 6.5–8.1)

## 2018-10-23 LAB — CBC WITH DIFFERENTIAL/PLATELET
Abs Immature Granulocytes: 0.02 10*3/uL (ref 0.00–0.07)
Basophils Absolute: 0 10*3/uL (ref 0.0–0.1)
Basophils Relative: 1 %
Eosinophils Absolute: 0.2 10*3/uL (ref 0.0–0.5)
Eosinophils Relative: 4 %
HCT: 42 % (ref 36.0–46.0)
Hemoglobin: 14.4 g/dL (ref 12.0–15.0)
Immature Granulocytes: 0 %
Lymphocytes Relative: 17 %
Lymphs Abs: 0.8 10*3/uL (ref 0.7–4.0)
MCH: 29.8 pg (ref 26.0–34.0)
MCHC: 34.3 g/dL (ref 30.0–36.0)
MCV: 87 fL (ref 80.0–100.0)
Monocytes Absolute: 0.6 10*3/uL (ref 0.1–1.0)
Monocytes Relative: 12 %
Neutro Abs: 3 10*3/uL (ref 1.7–7.7)
Neutrophils Relative %: 66 %
Platelets: 146 10*3/uL — ABNORMAL LOW (ref 150–400)
RBC: 4.83 MIL/uL (ref 3.87–5.11)
RDW: 12.9 % (ref 11.5–15.5)
WBC: 4.5 10*3/uL (ref 4.0–10.5)
nRBC: 0 % (ref 0.0–0.2)

## 2018-10-23 LAB — LACTATE DEHYDROGENASE: LDH: 156 U/L (ref 98–192)

## 2018-10-23 MED ORDER — SODIUM CHLORIDE 0.9% FLUSH
10.0000 mL | INTRAVENOUS | Status: DC | PRN
  Administered 2018-10-23: 10 mL
  Filled 2018-10-23: qty 10

## 2018-10-23 MED ORDER — HEPARIN SOD (PORK) LOCK FLUSH 100 UNIT/ML IV SOLN
500.0000 [IU] | Freq: Once | INTRAVENOUS | Status: AC | PRN
  Administered 2018-10-23: 500 [IU]
  Filled 2018-10-23: qty 5

## 2018-10-24 ENCOUNTER — Ambulatory Visit: Payer: Medicare Other

## 2018-10-24 ENCOUNTER — Other Ambulatory Visit: Payer: Medicare Other

## 2018-10-24 ENCOUNTER — Ambulatory Visit: Payer: Medicare Other | Admitting: Hematology

## 2018-10-24 ENCOUNTER — Telehealth: Payer: Self-pay | Admitting: Hematology

## 2018-10-24 MED ORDER — LIDOCAINE-PRILOCAINE 2.5-2.5 % EX CREA: 1.0000 "application " | TOPICAL_CREAM | CUTANEOUS | 0 refills | Status: DC | PRN

## 2018-10-24 NOTE — Telephone Encounter (Signed)
Scheduled appt per 5/13 los.  Patient awarre of appt date and time.

## 2018-10-25 ENCOUNTER — Ambulatory Visit: Payer: Medicare Other

## 2018-10-30 ENCOUNTER — Ambulatory Visit (HOSPITAL_COMMUNITY)
Admission: RE | Admit: 2018-10-30 | Discharge: 2018-10-30 | Disposition: A | Discharge status: Home / Self Care | Payer: Medicare Other | Source: Ambulatory Visit | Attending: Hematology | Admitting: Hematology

## 2018-10-30 ENCOUNTER — Other Ambulatory Visit: Payer: Self-pay

## 2018-10-30 DIAGNOSIS — C8591 Non-Hodgkin lymphoma, unspecified, lymph nodes of head, face, and neck: Secondary | ICD-10-CM | POA: Diagnosis not present

## 2018-10-30 DIAGNOSIS — C8238 Follicular lymphoma grade IIIa, lymph nodes of multiple sites: Secondary | ICD-10-CM | POA: Diagnosis not present

## 2018-10-30 MED ORDER — HEPARIN SOD (PORK) LOCK FLUSH 100 UNIT/ML IV SOLN
500.0000 [IU] | Freq: Once | INTRAVENOUS | Status: AC
  Administered 2018-10-30: 500 [IU] via INTRAVENOUS

## 2018-10-30 MED ORDER — IOHEXOL 300 MG/ML  SOLN
75.0000 mL | Freq: Once | INTRAMUSCULAR | Status: AC | PRN
  Administered 2018-10-30: 11:00:00 75 mL via INTRAVENOUS

## 2018-10-30 MED ORDER — IOHEXOL 300 MG/ML  SOLN: 75.0000 mL | Freq: Once | INTRAMUSCULAR | Status: DC | PRN

## 2018-10-30 MED ORDER — HEPARIN SOD (PORK) LOCK FLUSH 100 UNIT/ML IV SOLN
INTRAVENOUS | Status: AC
  Filled 2018-10-30: qty 5

## 2018-10-30 MED ORDER — SODIUM CHLORIDE (PF) 0.9 % IJ SOLN
INTRAMUSCULAR | Status: AC
  Filled 2018-10-30: qty 50

## 2018-11-08 ENCOUNTER — Telehealth: Payer: Self-pay | Admitting: Hematology

## 2018-11-08 NOTE — Progress Notes (Signed)
HEMATOLOGY/ONCOLOGY CONSULTATION NOTE  Date of Service: 11/11/2018  Patient Care Team: [name], MD as PCP - General (Internal Medicine) Stanford Breed [name], MD as PCP - Cardiology (Cardiology) [name], MD as Consulting Physician (Hematology) [name], RN as Oncology Nurse Navigator (Oncology)  CHIEF COMPLAINTS/PURPOSE OF CONSULTATION:  Follicular lymphoma grade 3a  HISTORY OF PRESENTING ILLNESS:   Rachael Greer is a wonderful 78 y.o. female who has been referred to [name] by Dr. 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 She is accompanied today by a friend, and our Nurse Navigator [name]. The pt reports that she is doing well overall.   The pt reports that things began changing last September in 2018. She notes that she has taken Flonase, Claritin, and saline solution and did not have a runny nose, and did not have any breathing difficulty. She notes that her head felt as though she had a flu or a cold, without any other symptoms. She notes that she felt as if her ears were swollen and has had some minimal hearing impairment, and has myringotomy tubes. She began a steroid course about 4 months ago with Dr. [name]. She notes that she first noticed right sided neck swelling in the last month, and left sided neck swelling which began 2-3 weeks ago.   She obtained a CT Neck on 03/08/18, as noted below.   The pt notes that she has not had fevers or chills, but has woken up recently with night sweats occasionally over the last 6 months. She adds that she has noticed some SOB in the last 7-8 months which presents intermittently, and has not progressed. The pt notes that she has had some difficulty swallowing in the past, and saw Dr. [name] for concerns of a stricture, and adds that this she has had recent difficulty swallowing. The pt also notes that she has recently felt dizziness, described as the room spinning. She has not had her dizziness worked  up.  The pt notes that she had a mastectomy without radiation or chemotherapy for breast cancer in situ in 2013. She began tamoxifen preventatively afterwards for 2.5 years.   The pt notes that her interstitial cystitis cause is unknown. She notes that she took a TB vaccine 5-7 years ago, and developed severe pain and sepsis. She notes that she has not had recurrent UTIs and takes Elmiron under the care of Dr. [name] in urology. She notes that there are certain foods that she cannot eat without developing bladder pain. She also denies blood in the urine.  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 2. RIGHT lymphadenitis seen with superimposed infection or pericapsular spread of tumor.  Most recent lab results (03/08/18) of CBC w/diff and BMP is as follows: all values are WNL except for RBC at 5.22, HGB at 15.3.  On review of systems, pt reports some night sweats, some SOB, right neck swelling, left neck swelling, dizziness, hearing impairment, healed incision, right ear numbness, and denies fevers, chills, runny nose, abdominal pains, abdominal distension, blood in the urine, sinus pressure or pain, pain along the spine, leg swelling, skin rashes, and any other symptoms.   On PMHx the pt reports breast cancer in 2013, sternocleidomastoid release in childhood, interstitial cystitis, sepsis. On Social Hx the pt denies ever  smoking cigarettes and denies ever consuming alcohol  Interval History:  I connected with Rachael Greer on 11/11/18 at  8:40 AM EDT by telephone and verified that I am speaking with the correct person using two identifiers.  I discussed the limitations, risks, security and privacy concerns of performing an evaluation and management service by  telemedicine and the availability of in-person appointments. I also discussed with the patient that there may be a patient responsible charge related to this service. The patient expressed understanding and agreed to proceed.   Other persons participating in the visit and their role in the encounter: her friend/caregiver Rachael Greer.  Patient's location: my office at the [facility] Provider's location: her home  Rachael Greer is called today for management and evaluation of her Grade 3a Follicular Lymphoma, and after completing 3 cycles of BR. The patient's last visit with [name] was on 10/23/18. The pt reports that she is doing well overall.  The pt reports that she has continued to have a feeling of fullness in her ears. She notes that she has been hot at night recently but denies any drenching night sweats. She notes that she has had stable energy levels. She denies fevers or concerns for infections.  Of note since the patient's last visit, pt has had a CT Maxillofacial completed on 10/30/18 with results revealing "No recurrent mass in the face or pharynx. Mastoid sinus clear bilaterally. Middle ear clear bilaterally. Paranasal sinuses clear."  On review of systems, pt reports sense of fullness in ears, stable energy levels, and denies drenching night sweats, fevers, concerns for infections, and any other symptoms.   MEDICAL HISTORY:  Past Medical History:  Diagnosis Date   Anxiety    Breast cancer ([place])    Chest pain    Depression    DOE (dyspnea on exertion)    Eustachian tube obstruction, bilateral    Fibromyalgia    Gastric reflux    History of kidney stones    Hyperlipidemia    Hypertension    IBS (irritable bowel syndrome)    Interstitial cystitis    Nephrolithiasis    Nephrolithiasis    Palpitations    Pre-diabetes    Sepsis ([place])     SURGICAL HISTORY: Past Surgical History:  Procedure Laterality Date   BREAST DUCTAL SYSTEM EXCISION Right  10/30/2016   Procedure: RIGHT BREAST DUCT EXCISION;  Surgeon: Rolm Bookbinder, MD;  Location: [facility];  Service: General;  Laterality: Right;   EYE SURGERY     bilateral lense placements   IR IMAGING GUIDED PORT INSERTION  07/12/2018   MASTECTOMY Left    NECK SURGERY     muscles cut in her neck   NEPHROSTOMY     TONSILLECTOMY      SOCIAL HISTORY: Social History   Socioeconomic History   Marital status: Widowed    Spouse name: Not on file   Number of children: 4   Years of education: Not on file   Highest education level: Not on file  Occupational History   Not on file  Social Needs   Financial resource strain: Not on file   Food insecurity:    Worry: Not on file    Inability: Not on file   Transportation needs:    Medical: Not on file    Non-medical: Not on file  Tobacco Use   Smoking status: Never Smoker   Smokeless tobacco: Never Used  Substance and Sexual Activity   Alcohol use: No  Drug use: No   Sexual activity: Not on file  Lifestyle   Physical activity:    Days per week: Not on file    Minutes per session: Not on file   Stress: Not on file  Relationships   Social connections:    Talks on phone: Not on file    Gets together: Not on file    Attends religious service: Not on file    Active member of club or organization: Not on file    Attends meetings of clubs or organizations: Not on file    Relationship status: Not on file   Intimate partner violence:    Fear of current or ex partner: Not on file    Emotionally abused: Not on file    Physically abused: Not on file    Forced sexual activity: Not on file  Other Topics Concern   Not on file  Social History Narrative   Not on file    FAMILY HISTORY: Family History  Problem Relation Age of Onset   CAD Father    Heart attack Father    Dementia Mother     ALLERGIES:  is allergic to amoxicillin and no known allergies.  MEDICATIONS:  Current Outpatient Medications   Medication Sig Dispense Refill   amLODipine (NORVASC) 2.5 MG tablet Take 1 tablet (2.5 mg total) by mouth daily. 90 tablet 2   aspirin EC 81 MG tablet Take 1 tablet (81 mg total) by mouth daily. 90 tablet 3   atorvastatin (LIPITOR) 20 MG tablet Take 1 tablet (20 mg total) by mouth daily at 6 PM. 90 tablet 3   clonazePAM (KLONOPIN) 1 MG tablet Take 1 mg by mouth 2 (two) times daily as needed for anxiety.      Ibuprofen-Diphenhydramine HCl (ADVIL PM) 200-25 MG CAPS Take 0.5-1 tablets by mouth daily as needed (sleep).      lidocaine-prilocaine (EMLA) cream Apply 1 application topically as needed. 30 g 0   metoprolol succinate (TOPROL-XL) 25 MG 24 hr tablet Take 25 mg by mouth every evening.     omeprazole (PRILOSEC) 40 MG capsule Take 40 mg by mouth daily.     ondansetron (ZOFRAN) 8 MG tablet Take 1 tablet (8 mg total) by mouth 2 (two) times daily as needed for nausea or vomiting. 40 tablet 1   pentosan polysulfate (ELMIRON) 100 MG capsule Take 100 mg by mouth 3 (three) times daily.      Polyethyl Glycol-Propyl Glycol (SYSTANE OP) Apply 1 drop to eye 2 (two) times daily.     prochlorperazine (COMPAZINE) 10 MG tablet TAKE 1 TABLET(10 MG) BY MOUTH EVERY 6 HOURS AS NEEDED FOR NAUSEA OR VOMITING 30 tablet 0   No current facility-administered medications for this visit.     REVIEW OF SYSTEMS:    A 10+ POINT REVIEW OF SYSTEMS WAS OBTAINED including neurology, dermatology, psychiatry, cardiac, respiratory, lymph, extremities, GI, GU, Musculoskeletal, constitutional, breasts, reproductive, HEENT.  All pertinent positives are noted in the HPI.  All others are negative.   PHYSICAL EXAMINATION: Not possible - telephone visit  LABORATORY DATA:  I have reviewed the data as listed  . CBC Latest Ref Rng & Units 10/23/2018 08/29/2018 08/01/2018  WBC 4.0 - 10.5 K/uL 4.5 3.2(L) 4.7  Hemoglobin 12.0 - 15.0 g/dL 14.4 13.0 14.4  Hematocrit 36.0 - 46.0 % 42.0 38.0 42.8  Platelets 150 - 400 K/uL  146(L) 121(L) 142(L)   . CBC    Component Value Date/Time   WBC 4.5 10/23/2018  1127   RBC 4.83 10/23/2018 1127   HGB 14.4 10/23/2018 1127   HGB 16.3 (H) 11/02/2017 1529   HCT 42.0 10/23/2018 1127   HCT 46.7 (H) 11/02/2017 1529   PLT 146 (L) 10/23/2018 1127   PLT 192 11/02/2017 1529   MCV 87.0 10/23/2018 1127   MCV 85 11/02/2017 1529   MCH 29.8 10/23/2018 1127   MCHC 34.3 10/23/2018 1127   RDW 12.9 10/23/2018 1127   RDW 13.9 11/02/2017 1529   LYMPHSABS 0.8 10/23/2018 1127   LYMPHSABS 2.1 11/02/2017 1529   MONOABS 0.6 10/23/2018 1127   EOSABS 0.2 10/23/2018 1127   EOSABS 0.1 11/02/2017 1529   BASOSABS 0.0 10/23/2018 1127   BASOSABS 0.1 11/02/2017 1529     . CMP Latest Ref Rng & Units 10/23/2018 08/29/2018 08/01/2018  Glucose 70 - 99 mg/dL 106(H) 90 104(H)  BUN 8 - 23 mg/dL 19 13 13   Creatinine 0.44 - 1.00 mg/dL 0.91 0.77 0.99  Sodium 135 - 145 mmol/L 142 142 141  Potassium 3.5 - 5.1 mmol/L 4.0 4.0 3.9  Chloride 98 - 111 mmol/L 108 109 108  CO2 22 - 32 mmol/L 26 24 23   Calcium 8.9 - 10.3 mg/dL 9.8 9.9 10.2  Total Protein 6.5 - 8.1 g/dL 6.8 6.4(L) 6.8  Total Bilirubin 0.3 - 1.2 mg/dL 1.5(H) 1.8(H) 1.8(H)  Alkaline Phos 38 - 126 U/L 86 82 95  AST 15 - 41 U/L 21 23 26   ALT 0 - 44 U/L 31 31 27     03/22/18 Flow Cytometry:   03/22/18 Biopsy:     RADIOGRAPHIC STUDIES: I have personally reviewed the radiological images as listed and agreed with the findings in the report. Ct Soft Tissue Neck W Contrast  Result Date: 10/30/2018 CLINICAL DATA:  Non-Hodgkin's lymphoma nasopharynx EXAM: CT NECK WITH CONTRAST TECHNIQUE: Multidetector CT imaging of the neck was performed using the standard protocol following the bolus administration of intravenous contrast. CONTRAST:  78mL OMNIPAQUE IOHEXOL 300 MG/ML  SOLN COMPARISON:  CT neck 03/08/2018 FINDINGS: Pharynx and larynx: Interval resolution of nasopharyngeal mass. No pharyngeal mass or edema is seen. Epiglottis and larynx normal.  Salivary glands: No inflammation, mass, or stone. Thyroid: Negative Lymph nodes: No pathologic adenopathy in the neck. Previously noted enlarged lymph nodes bilaterally right greater than left have resolved. Vascular: Normal vascular enhancement.  Right jugular Port-A-Cath. Limited intracranial: Negative Visualized orbits: Negative for mass.  Bilateral cataract surgery Mastoids and visualized paranasal sinuses: Negative Skeleton: No acute abnormality Upper chest: Negative Other: None IMPRESSION: Interval resolution of nasopharyngeal mass and cervical adenopathy bilaterally. No residual mass or adenopathy identified in the neck. Electronically Signed   By: Franchot Gallo M.D.   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 EXAM: CT MAXILLOFACIAL WITH CONTRAST TECHNIQUE: Multidetector CT imaging of the maxillofacial structures was performed with intravenous contrast. Multiplanar CT image reconstructions were also generated. CONTRAST:  79mL OMNIPAQUE IOHEXOL 300 MG/ML  SOLN COMPARISON:  CT neck 03/08/2018 FINDINGS: Osseous: No acute skeletal abnormality.  No fracture or mass. Orbits: No orbital soft tissue mass.  Bilateral cataract surgery Sinuses: Paranasal sinuses clear. No mucosal edema or air-fluid level. Mastoid clear bilaterally. Middle ear clear bilaterally. Soft tissues: Negative for soft tissue mass. No enlarged regional lymph nodes. Carotid artery calcification bilaterally. Limited intracranial: Negative IMPRESSION: No recurrent mass in the face or pharynx Mastoid sinus clear bilaterally. Middle ear clear bilaterally. Paranasal sinuses  clear. Electronically Signed   By: Franchot Gallo M.D.   On: 10/30/2018 16:33    ASSESSMENT & PLAN:   78 y.o. female with  1. 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 2. RIGHT lymphadenitis seen with superimposed infection or pericapsular spread of tumor.    03/22/18 biopsy revealed follicular lymphoma, Grade 3a   04/04/18 Hep B and Hep C negative   04/17/18 PET/CT revealed Notable bilateral adenopathy in the neck and posterior nasopharynx is hypermetabolic and mostly De[place] 5 level activity, compatible with malignancy. 2. No pathologically enlarged or significantly hypermetabolic adenopathy in the chest, or abdomen/pelvis. No splenomegaly. Slight enlargement of the photopenic splenic cyst noted on remote prior Exams. 3. Other imaging findings of potential clinical significance: Aortic Atherosclerosis. Coronary atherosclerosis. Diffuse hepatic steatosis. Nonobstructive left nephrolithiasis.   S/p C1 with Rituxan alone, then 3 cycles of combined BR completed by 08/02/18. Reduced Bendamustine to 70mg /m2 for interstitial cystitis. Chose not to treat with R-CHOP given interstitial cystitis.  08/22/18 PET/CT revealed Interval resolution of bilateral hypermetabolic cervical and posterior nasopharyngeal adenopathy. No evidence for residual or recurrent metabolically active tumor. 2. Aortic Atherosclerosis. Lad coronary artery calcifications. 3. New nonspecific 5 mm subpleural nodule is noted within the posteromedial left upper lobe.   Discussed that typically we would complete 2 additional cycles after full response, however the pt does have interstitial cystitis and endorses worsening memory changes and functional status. Discussed that in light of this, I would give her three options of either more conservative watchful observation vs maintenance Rituxan vs more aggressive completing 2 final cycles of BR. After discussion, the pt preferred to stop induction treatment, and to return for further conversation and considerations about maintenance Rituxan  2.  Interstitial cystitis - stable.  PLAN:  -Discussed the 10/30/18 CT Maxillofacial which revealed "No recurrent mass in the face or pharynx. Mastoid sinus clear bilaterally. Middle ear clear bilaterally. Paranasal sinuses clear." -Discussed the 10/30/18 CT Neck which revealed "Interval resolution of nasopharyngeal mass and cervical adenopathy bilaterally. No residual mass or adenopathy identified in the neck." -Discussed that the patient's sense of ear fullness is not correlating with her most recent CT imaging and does not appear to be related to her lymphoma. Discussed possibility of age related changes in tympanic membrane or ossicles? Or tinnitus from other reasons. Would recommend following up with ENT for further evaluation. -The pt shows no clinical or lab progression/return of her Follicular lymphoma at this time.  -Discussed again the indications to consider completing maintenance Rituxan every 2 months, however considering her concerns with interstitial cystitis, concerns for memory and other medical priorities, do not feel it is unreasonable to hold off on maintenance Rituxan. Pt agrees with this and would like to pursue watchful observation -Recommend that pt return to care with her PCP Dr. [name] and establish care with [name] in Neurology on 12/02/18, for further work up and management of sense of memory loss/cognitive decline and dementia work up -Continue follow up with Urology Dr. Alona Bene for interstitial cystitis. -Advised taking Vitamin B complex -Advised crowd avoidance and infection prevention strategies -Will see the pt back in 3 months   RTC with Dr Irene Limbo with labs in 3 months   I discussed the assessment and treatment plan with the patient. The patient was provided an opportunity to ask questions and all were answered. The patient agreed  with the plan and demonstrated an understanding of the instructions.   The patient was advised to call back or seek an in-person  evaluation if the symptoms worsen or if the condition fails to improve as anticipated.  The total time spent in the appt was 25 minutes and more than 50% was on counseling and direct patient cares.    Sullivan Lone MD MS [facility] Hematology/Oncology Physician [facility]  (Office):       [phone] (Work cell):  [phone] (Fax):           [phone]  11/11/2018 10:04 AM  I, Baldwin [place], am acting as a scribe for Dr. Sullivan Lone.   .I have reviewed the above documentation for accuracy and completeness, and I agree with the above. [name] MD

## 2018-11-08 NOTE — Telephone Encounter (Signed)
Called and confirmed appt and verified information.

## 2018-11-11 ENCOUNTER — Telehealth: Payer: Self-pay | Admitting: *Deleted

## 2018-11-11 ENCOUNTER — Telehealth: Payer: Self-pay | Admitting: Hematology

## 2018-11-11 ENCOUNTER — Inpatient Hospital Stay: Payer: Medicare Other | Attending: Hematology | Admitting: Hematology

## 2018-11-11 DIAGNOSIS — R42 Dizziness and giddiness: Secondary | ICD-10-CM | POA: Diagnosis not present

## 2018-11-11 DIAGNOSIS — R0602 Shortness of breath: Secondary | ICD-10-CM | POA: Insufficient documentation

## 2018-11-11 DIAGNOSIS — Z9011 Acquired absence of right breast and nipple: Secondary | ICD-10-CM | POA: Diagnosis not present

## 2018-11-11 DIAGNOSIS — Z9223 Personal history of estrogen therapy: Secondary | ICD-10-CM | POA: Insufficient documentation

## 2018-11-11 DIAGNOSIS — N3010 Interstitial cystitis (chronic) without hematuria: Secondary | ICD-10-CM | POA: Insufficient documentation

## 2018-11-11 DIAGNOSIS — R1310 Dysphagia, unspecified: Secondary | ICD-10-CM | POA: Insufficient documentation

## 2018-11-11 DIAGNOSIS — C8238 Follicular lymphoma grade IIIa, lymph nodes of multiple sites: Secondary | ICD-10-CM | POA: Insufficient documentation

## 2018-11-11 DIAGNOSIS — Z79899 Other long term (current) drug therapy: Secondary | ICD-10-CM | POA: Insufficient documentation

## 2018-11-11 DIAGNOSIS — F419 Anxiety disorder, unspecified: Secondary | ICD-10-CM | POA: Insufficient documentation

## 2018-11-11 DIAGNOSIS — Z86000 Personal history of in-situ neoplasm of breast: Secondary | ICD-10-CM | POA: Diagnosis not present

## 2018-11-11 DIAGNOSIS — K219 Gastro-esophageal reflux disease without esophagitis: Secondary | ICD-10-CM | POA: Diagnosis not present

## 2018-11-11 DIAGNOSIS — R61 Generalized hyperhidrosis: Secondary | ICD-10-CM | POA: Diagnosis not present

## 2018-11-11 DIAGNOSIS — I10 Essential (primary) hypertension: Secondary | ICD-10-CM | POA: Insufficient documentation

## 2018-11-11 DIAGNOSIS — I700 Atherosclerosis of aorta: Secondary | ICD-10-CM | POA: Insufficient documentation

## 2018-11-11 DIAGNOSIS — E785 Hyperlipidemia, unspecified: Secondary | ICD-10-CM | POA: Diagnosis not present

## 2018-11-11 NOTE — Telephone Encounter (Signed)
Scheduled appt per 6/1 los. ° °A calendar will be mailed out. °

## 2018-11-11 NOTE — Telephone Encounter (Signed)
Contacted by Rachael Greer and patient. Patient has requested that Ms. Fabio Neighbors (friend and caregiver) be included on her schedule messages. Care Coordination note entered with request.

## 2018-11-25 ENCOUNTER — Telehealth: Payer: Self-pay | Admitting: *Deleted

## 2018-11-25 NOTE — Telephone Encounter (Signed)
Oncology Nurse Navigator Documentation  Returned call to Ms. Allbritton.  I reiterated findings of 5/20 CT per Dr. Grier Mitts 6/1 phone conversation with her, advised her to make appt with ENT Dr. Redmond Baseman to address the "crackling" in her ears.  I confirmed RTC scheduled 9/1 starting with 11:30 Labs followed by 12:00 with Dr. Irene Limbo. She voiced understanding of information provided.  [name], RN, BSN Head & Neck Oncology Nurse [place] [facility] [place] [phone]

## 2018-11-27 ENCOUNTER — Ambulatory Visit: Payer: Medicare Other | Admitting: Cardiology

## 2018-11-29 ENCOUNTER — Encounter: Payer: Self-pay | Admitting: Neurology

## 2018-11-29 ENCOUNTER — Other Ambulatory Visit: Payer: Self-pay

## 2018-11-29 ENCOUNTER — Ambulatory Visit: Payer: Medicare Other | Admitting: Neurology

## 2018-11-29 ENCOUNTER — Other Ambulatory Visit (INDEPENDENT_AMBULATORY_CARE_PROVIDER_SITE_OTHER): Payer: Medicare Other

## 2018-11-29 VITALS — BP 128/60 | HR 66 | Ht 63.0 in | Wt 158.0 lb

## 2018-11-29 DIAGNOSIS — R413 Other amnesia: Secondary | ICD-10-CM | POA: Diagnosis not present

## 2018-11-29 DIAGNOSIS — C8511 Unspecified B-cell lymphoma, lymph nodes of head, face, and neck: Secondary | ICD-10-CM | POA: Diagnosis not present

## 2018-11-29 DIAGNOSIS — F329 Major depressive disorder, single episode, unspecified: Secondary | ICD-10-CM

## 2018-11-29 DIAGNOSIS — F419 Anxiety disorder, unspecified: Secondary | ICD-10-CM | POA: Diagnosis not present

## 2018-11-29 HISTORY — DX: Major depressive disorder, single episode, unspecified: F32.9

## 2018-11-29 LAB — TSH: TSH: 1.67 mIU/L (ref 0.40–4.50)

## 2018-11-29 LAB — VITAMIN B12: Vitamin B-12: 690 pg/mL (ref 200–1100)

## 2018-11-29 MED ORDER — DONEPEZIL HCL 5 MG PO TABS: ORAL_TABLET | ORAL | 11 refills | Status: DC

## 2018-11-29 NOTE — Patient Instructions (Signed)
1. Schedule MRI brain with and without contrast 2. Bloodwork for TSH, B12 3. Refer for home PT for balance therapy and vestibular therapy 4. Refer to Psychiatry for anxiety 5. Schedule Neurocognitive testing for Sept/Oct 6. Restart Aricept 5mg  daily 7. No driving 8. Start thinking about getting more help at home or looking into assisted living 9. Follow-up in 6 months, call for any changes  FALL PRECAUTIONS: Be cautious when walking. Scan the area for obstacles that may increase the risk of trips and falls. When getting up in the mornings, sit up at the edge of the bed for a few minutes before getting out of bed. Consider elevating the bed at the head end to avoid drop of blood pressure when getting up. Walk always in a well-lit room (use night lights in the walls). Avoid area rugs or power cords from appliances in the middle of the walkways. Use a walker or a cane if necessary and consider physical therapy for balance exercise. Get your eyesight checked regularly.  FINANCIAL OVERSIGHT: Supervision, especially oversight when making financial decisions or transactions is also recommended.  HOME SAFETY: Consider the safety of the kitchen when operating appliances like stoves, microwave oven, and blender. Consider having supervision and share cooking responsibilities until no longer able to participate in those. Accidents with firearms and other hazards in the house should be identified and addressed as well.  DRIVING: Regarding driving, in patients with progressive memory problems, driving will be impaired. We advise to have someone else do the driving if trouble finding directions or if minor accidents are reported. Independent driving assessment is available to determine safety of driving.  ABILITY TO BE LEFT ALONE: If patient is unable to contact 911 operator, consider using LifeLine, or when the need is there, arrange for someone to stay with patients. Smoking is a fire hazard, consider  supervision or cessation. Risk of wandering should be assessed by caregiver and if detected at any point, supervision and safe proof recommendations should be instituted.  MEDICATION SUPERVISION: Inability to self-administer medication needs to be constantly addressed. Implement a mechanism to ensure safe administration of the medications.  RECOMMENDATIONS FOR ALL PATIENTS WITH MEMORY PROBLEMS: 1. Continue to exercise (Recommend 30 minutes of walking everyday, or 3 hours every week) 2. Increase social interactions - continue going to [place] and enjoy social gatherings with friends and family 3. Eat healthy, avoid fried foods and eat more fruits and vegetables 4. Maintain adequate blood pressure, blood sugar, and blood cholesterol level. Reducing the risk of stroke and cardiovascular disease also helps promoting better memory. 5. Avoid stressful situations. Live a simple life and avoid aggravations. Organize your time and prepare for the next day in anticipation. 6. Sleep well, avoid any interruptions of sleep and avoid any distractions in the bedroom that may interfere with adequate sleep quality 7. Avoid sugar, avoid sweets as there is a strong link between excessive sugar intake, diabetes, and cognitive impairment The Mediterranean diet has been shown to help patients reduce the risk of progressive memory disorders and reduces cardiovascular risk. This includes eating fish, eat fruits and green leafy vegetables, nuts like almonds and hazelnuts, walnuts, and also use olive oil. Avoid fast foods and fried foods as much as possible. Avoid sweets and sugar as sugar use has been linked to worsening of memory function.  There is always a concern of gradual progression of memory problems. If this is the case, then we may need to adjust level of care according to patient needs.  Support, both to the patient and caregiver, should then be put into place.
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
## 2018-12-02 ENCOUNTER — Telehealth: Payer: Self-pay

## 2018-12-02 ENCOUNTER — Ambulatory Visit: Payer: Medicare Other | Admitting: Neurology

## 2018-12-02 NOTE — Telephone Encounter (Signed)
Pt POA called given lab results was normal, proceed with MRI brain as discussed

## 2018-12-05 ENCOUNTER — Telehealth: Payer: Self-pay | Admitting: Neurology

## 2018-12-05 NOTE — Telephone Encounter (Signed)
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 Also, the Aricept medication gave her a bad reaction; dizziness really bad and so she stopped taking that. Thanks!

## 2018-12-05 NOTE — Telephone Encounter (Signed)
Is it ok to send referral to where requested and FYI on Aricept

## 2018-12-06 ENCOUNTER — Other Ambulatory Visit: Payer: Self-pay

## 2018-12-06 DIAGNOSIS — R413 Other amnesia: Secondary | ICD-10-CM

## 2018-12-06 NOTE — Telephone Encounter (Signed)
POA called back pt will be living at [facility] on [address] in July referral was sent in for therapy to look at and call POA to see what they would like for them to do.

## 2018-12-06 NOTE — Telephone Encounter (Signed)
Pt POA called Ok to send PT orders for Balance therapy and vestibular therapy. Hold off on Aricept for now, call us in 2 weeks as she feels better, we can start a different medication. Pt POA verbalized understanding will call back in 2 week,

## 2018-12-06 NOTE — Progress Notes (Signed)
Pt

## 2018-12-06 NOTE — Telephone Encounter (Signed)
Ok to send PT orders for Balance therapy and vestibular therapy for Dx: dizziness and gait instability. Hold off on Aricept for now, call us in 2 weeks as she feels better, we can start a different medication. pls let POA [name] know this information, thanks!

## 2018-12-06 NOTE — Telephone Encounter (Signed)
POA called to try and get information on the Rehab she would like referral sent to. No answer voice mail left for her to call back

## 2018-12-09 DIAGNOSIS — G3184 Mild cognitive impairment, so stated: Secondary | ICD-10-CM | POA: Diagnosis not present

## 2018-12-09 DIAGNOSIS — I10 Essential (primary) hypertension: Secondary | ICD-10-CM | POA: Diagnosis not present

## 2018-12-09 DIAGNOSIS — H68103 Unspecified obstruction of Eustachian tube, bilateral: Secondary | ICD-10-CM | POA: Diagnosis not present

## 2018-12-10 ENCOUNTER — Telehealth: Payer: Self-pay

## 2018-12-10 NOTE — Telephone Encounter (Signed)
Pt POA called stated that [facility] will not see pt because she will be living in assistant living soon. And that she can not be seen before then because 1st appointment is not until end of September, asking if you can order her a new anxiety medication?

## 2018-12-11 ENCOUNTER — Telehealth: Payer: Self-pay

## 2018-12-11 MED ORDER — CITALOPRAM HYDROBROMIDE 10 MG PO TABS: 10.0000 mg | ORAL_TABLET | Freq: Every day | ORAL | 3 refills | Status: DC

## 2018-12-11 NOTE — Telephone Encounter (Signed)
-----   Message from Wilder Glade, LPN sent at 10/18/5636  3:20 PM EDT -----  ----- Message ----- From: Cameron Sprang, MD Sent: 12/11/2018   2:59 PM EDT To: Wilder Glade, LPN  Pls check with [name] if patient would be willing to start citalopram 10mg  daily. Side effect can be drowsiness or dizziness, but it is a low dose. This medication takes 3-4 weeks for full effect with anxiety. It is not a memory medication, the one we started last time was for memory not anxiety. Thanks ----- Message ----- From: Wilder Glade, LPN Sent: 7/56/4332  11:14 AM EDT To: Cameron Sprang, MD  Pt POA called stated that [facility] will not see pt because she will be living in assistant living soon. And that she can not be seen before then because 1st appointment is not until end of September, asking if you can order her a new anxiety medication?

## 2018-12-11 NOTE — Telephone Encounter (Signed)
Per [name] stay on Klonopin until pt has been on Citalopram for 4 weeks.  If pt is managing well on Citalopram and if [name] and pt wish then they can decrease Klonopin to one per day. First, call back with an update.

## 2018-12-11 NOTE — Telephone Encounter (Signed)
Spoke with [name]  She is fine with pt starting Citalopram. Informed of side effects.  Pt has been off of Aricept for at least 2 weeks.  [name] wants to know how to wean pt off of Klonopin.

## 2018-12-12 NOTE — Telephone Encounter (Signed)
[name] made aware to continue pt on Klonopin for 4 weeks until Citalopram has been taken for 4 weeks.  They will call back with an update. A staff message has been sent to myself to remind me to contact.  If doing well and they wish to d/c Klonopin, [name] agreed to titrate to one tablet qd then qod to see how pt tolerates.

## 2018-12-12 NOTE — Telephone Encounter (Signed)
Left message for [name] to return call.

## 2018-12-16 DIAGNOSIS — I10 Essential (primary) hypertension: Secondary | ICD-10-CM | POA: Diagnosis not present

## 2018-12-16 DIAGNOSIS — G3184 Mild cognitive impairment, so stated: Secondary | ICD-10-CM | POA: Diagnosis not present

## 2018-12-16 DIAGNOSIS — H68103 Unspecified obstruction of Eustachian tube, bilateral: Secondary | ICD-10-CM | POA: Diagnosis not present

## 2018-12-23 DIAGNOSIS — G3184 Mild cognitive impairment, so stated: Secondary | ICD-10-CM | POA: Diagnosis not present

## 2018-12-23 DIAGNOSIS — H68103 Unspecified obstruction of Eustachian tube, bilateral: Secondary | ICD-10-CM | POA: Diagnosis not present

## 2018-12-23 DIAGNOSIS — I10 Essential (primary) hypertension: Secondary | ICD-10-CM | POA: Diagnosis not present

## 2018-12-24 ENCOUNTER — Other Ambulatory Visit: Payer: Self-pay

## 2018-12-24 ENCOUNTER — Ambulatory Visit
Admission: RE | Admit: 2018-12-24 | Discharge: 2018-12-24 | Disposition: A | Discharge status: Home / Self Care | Payer: Medicare Other | Source: Ambulatory Visit | Attending: Neurology | Admitting: Neurology

## 2018-12-24 DIAGNOSIS — Z8572 Personal history of non-Hodgkin lymphomas: Secondary | ICD-10-CM | POA: Diagnosis not present

## 2018-12-24 DIAGNOSIS — R42 Dizziness and giddiness: Secondary | ICD-10-CM | POA: Diagnosis not present

## 2018-12-24 DIAGNOSIS — Z853 Personal history of malignant neoplasm of breast: Secondary | ICD-10-CM | POA: Diagnosis not present

## 2018-12-24 DIAGNOSIS — F419 Anxiety disorder, unspecified: Secondary | ICD-10-CM

## 2018-12-24 DIAGNOSIS — R413 Other amnesia: Secondary | ICD-10-CM | POA: Diagnosis not present

## 2018-12-24 DIAGNOSIS — C8511 Unspecified B-cell lymphoma, lymph nodes of head, face, and neck: Secondary | ICD-10-CM

## 2018-12-24 MED ORDER — GADOBENATE DIMEGLUMINE 529 MG/ML IV SOLN
14.0000 mL | Freq: Once | INTRAVENOUS | Status: AC | PRN
  Administered 2018-12-24: 14 mL via INTRAVENOUS

## 2018-12-30 ENCOUNTER — Telehealth: Payer: Self-pay | Admitting: Neurology

## 2018-12-30 NOTE — Telephone Encounter (Signed)
Informed patient MRI showed aging brain with no specific or reversible finding to explain symptoms.

## 2018-12-30 NOTE — Telephone Encounter (Signed)
New Message  Patient verbalized wanting to know about her MRI results

## 2018-12-30 NOTE — Telephone Encounter (Signed)
Daughter is calling in about MRI results. Thanks!

## 2019-01-06 ENCOUNTER — Telehealth: Payer: Self-pay | Admitting: Neurology

## 2019-01-06 NOTE — Telephone Encounter (Signed)
Left message with after hour service on 01-06-19 @ 11:53am    POA st pt needs a COVID test due to her moving in to independent living and they require test to be done

## 2019-01-07 ENCOUNTER — Other Ambulatory Visit: Payer: Self-pay

## 2019-01-07 DIAGNOSIS — Z20822 Contact with and (suspected) exposure to covid-19: Secondary | ICD-10-CM

## 2019-01-07 DIAGNOSIS — R6889 Other general symptoms and signs: Secondary | ICD-10-CM | POA: Diagnosis not present

## 2019-01-08 NOTE — Telephone Encounter (Signed)
Informed [name] that we do not perform COVID test. That pt would have to go to one of the testing sites or contact PCP.

## 2019-01-10 DIAGNOSIS — H68103 Unspecified obstruction of Eustachian tube, bilateral: Secondary | ICD-10-CM | POA: Diagnosis not present

## 2019-01-10 DIAGNOSIS — I10 Essential (primary) hypertension: Secondary | ICD-10-CM | POA: Diagnosis not present

## 2019-01-10 DIAGNOSIS — G3184 Mild cognitive impairment, so stated: Secondary | ICD-10-CM | POA: Diagnosis not present

## 2019-01-10 LAB — NOVEL CORONAVIRUS, NAA: SARS-CoV-2, NAA: NOT DETECTED

## 2019-01-13 DIAGNOSIS — G3184 Mild cognitive impairment, so stated: Secondary | ICD-10-CM | POA: Diagnosis not present

## 2019-01-13 DIAGNOSIS — I10 Essential (primary) hypertension: Secondary | ICD-10-CM | POA: Diagnosis not present

## 2019-01-13 DIAGNOSIS — H68103 Unspecified obstruction of Eustachian tube, bilateral: Secondary | ICD-10-CM | POA: Diagnosis not present

## 2019-01-15 DIAGNOSIS — H68103 Unspecified obstruction of Eustachian tube, bilateral: Secondary | ICD-10-CM | POA: Diagnosis not present

## 2019-01-15 DIAGNOSIS — I10 Essential (primary) hypertension: Secondary | ICD-10-CM | POA: Diagnosis not present

## 2019-01-15 DIAGNOSIS — G3184 Mild cognitive impairment, so stated: Secondary | ICD-10-CM | POA: Diagnosis not present

## 2019-01-20 DIAGNOSIS — G3184 Mild cognitive impairment, so stated: Secondary | ICD-10-CM | POA: Diagnosis not present

## 2019-01-20 DIAGNOSIS — I10 Essential (primary) hypertension: Secondary | ICD-10-CM | POA: Diagnosis not present

## 2019-01-20 DIAGNOSIS — H68103 Unspecified obstruction of Eustachian tube, bilateral: Secondary | ICD-10-CM | POA: Diagnosis not present

## 2019-01-22 ENCOUNTER — Telehealth: Payer: Self-pay | Admitting: Neurology

## 2019-01-22 DIAGNOSIS — R413 Other amnesia: Secondary | ICD-10-CM | POA: Diagnosis not present

## 2019-01-22 DIAGNOSIS — R42 Dizziness and giddiness: Secondary | ICD-10-CM | POA: Diagnosis not present

## 2019-01-22 NOTE — Telephone Encounter (Signed)
Citalopram medication 10mg - just saw PCP (Dr. [name]) and he recommended taking twice a day and it was written for once a day. She was wanting updated prescription sent to pharm so she will have enough if DR thinks ok. Pharm on file correct.Thanks!

## 2019-01-23 DIAGNOSIS — G3184 Mild cognitive impairment, so stated: Secondary | ICD-10-CM | POA: Diagnosis not present

## 2019-01-23 DIAGNOSIS — I10 Essential (primary) hypertension: Secondary | ICD-10-CM | POA: Diagnosis not present

## 2019-01-23 DIAGNOSIS — H68103 Unspecified obstruction of Eustachian tube, bilateral: Secondary | ICD-10-CM | POA: Diagnosis not present

## 2019-01-23 NOTE — Telephone Encounter (Signed)
Is pt alright to increase Citalopram to BID?

## 2019-01-23 NOTE — Telephone Encounter (Signed)
Ok to take citalopram 10mg  BID. Thanks

## 2019-01-24 ENCOUNTER — Other Ambulatory Visit: Payer: Self-pay

## 2019-01-24 MED ORDER — CITALOPRAM HYDROBROMIDE 10 MG PO TABS: 10.0000 mg | ORAL_TABLET | Freq: Two times a day (BID) | ORAL | 3 refills | Status: DC

## 2019-01-24 NOTE — Telephone Encounter (Signed)
DAUGHTER IS CALLING IN AND NEEDING THIS MEDICATION SENT TO THE PHARM BEFORE SHE LEAVES FOR OUT OF TOWN. THANKS!

## 2019-01-24 NOTE — Telephone Encounter (Signed)
[name] informed of new Rx being sent in

## 2019-01-27 DIAGNOSIS — G3184 Mild cognitive impairment, so stated: Secondary | ICD-10-CM | POA: Diagnosis not present

## 2019-01-27 DIAGNOSIS — I10 Essential (primary) hypertension: Secondary | ICD-10-CM | POA: Diagnosis not present

## 2019-01-27 DIAGNOSIS — H68103 Unspecified obstruction of Eustachian tube, bilateral: Secondary | ICD-10-CM | POA: Diagnosis not present

## 2019-01-30 DIAGNOSIS — G3184 Mild cognitive impairment, so stated: Secondary | ICD-10-CM | POA: Diagnosis not present

## 2019-01-30 DIAGNOSIS — I10 Essential (primary) hypertension: Secondary | ICD-10-CM | POA: Diagnosis not present

## 2019-01-30 DIAGNOSIS — H68103 Unspecified obstruction of Eustachian tube, bilateral: Secondary | ICD-10-CM | POA: Diagnosis not present

## 2019-02-03 NOTE — Progress Notes (Signed)
HPI: FUpalpitations. Holter monitor April 2015 showed sinus rhythm with PACs, PVCs and brief PAT.Stress echo November 2015 was normal. Echo January 2018 showed normal LV function and trace mitral regurgitation.  Had previous PET scan for lymphoma and atherosclerosis noted in aorta and coronaries.Since last seen,patient denies dyspnea, chest pain, palpitations or syncope.  She does have occasional dizziness that improves with Klonopin.  She is concerned about potential low blood pressure contributing as well.  Current Outpatient Medications  Medication Sig Dispense Refill  . amLODipine (NORVASC) 2.5 MG tablet Take 1 tablet (2.5 mg total) by mouth daily. 90 tablet 2  . aspirin EC 81 MG tablet Take 1 tablet (81 mg total) by mouth daily. 90 tablet 3  . atorvastatin (LIPITOR) 20 MG tablet Take 1 tablet (20 mg total) by mouth daily at 6 PM. (Patient taking differently: Take 20 mg by mouth once a week. ) 90 tablet 3  . citalopram (CELEXA) 10 MG tablet Take 1 tablet (10 mg total) by mouth 2 (two) times daily. 60 tablet 3  . clonazePAM (KLONOPIN) 1 MG tablet Take 1 mg by mouth 2 (two) times daily as needed for anxiety.     . famotidine (PEPCID) 20 MG tablet Take 20 mg by mouth daily.    . Ibuprofen-Diphenhydramine HCl (ADVIL PM) 200-25 MG CAPS Take 0.5-1 tablets by mouth daily as needed (sleep).     . metoprolol succinate (TOPROL-XL) 25 MG 24 hr tablet Take 25 mg by mouth every evening.    . pentosan polysulfate (ELMIRON) 100 MG capsule Take 100 mg by mouth 3 (three) times daily.      No current facility-administered medications for this visit.      Past Medical History:  Diagnosis Date  . Anxiety   . Breast cancer ([place])   . Chest pain   . Depression   . DOE (dyspnea on exertion)   . Eustachian tube obstruction, bilateral   . Fibromyalgia   . Gastric reflux   . History of kidney stones   . Hyperlipidemia   . Hypertension   . IBS (irritable bowel syndrome)   . Interstitial  cystitis   . Nephrolithiasis   . Nephrolithiasis   . Palpitations   . Pre-diabetes   . Sepsis [facility])     Past Surgical History:  Procedure Laterality Date  . BREAST DUCTAL SYSTEM EXCISION Right 10/30/2016   Procedure: RIGHT BREAST DUCT EXCISION;  Surgeon: Rolm Bookbinder, MD;  Location: [place];  Service: General;  Laterality: Right;  . EYE SURGERY     bilateral lense placements  . IR IMAGING GUIDED PORT INSERTION  07/12/2018  . MASTECTOMY Left   . NECK SURGERY     muscles cut in her neck  . NEPHROSTOMY    . TONSILLECTOMY      Social History   Socioeconomic History  . Marital status: Widowed    Spouse name: Not on file  . Number of children: 4  . Years of education: Not on file  . Highest education level: Not on file  Occupational History  . Not on file  Social Needs  . Financial resource strain: Not on file  . Food insecurity    Worry: Not on file    Inability: Not on file  . Transportation needs    Medical: Not on file    Non-medical: Not on file  Tobacco Use  . Smoking status: Never Smoker  . Smokeless tobacco: Never Used  Substance and Sexual Activity  .  Alcohol use: No  . Drug use: No  . Sexual activity: Not Currently  Lifestyle  . Physical activity    Days per week: Not on file    Minutes per session: Not on file  . Stress: Not on file  Relationships  . Social Herbalist on phone: Not on file    Gets together: Not on file    Attends religious service: Not on file    Active member of club or organization: Not on file    Attends meetings of clubs or organizations: Not on file    Relationship status: Not on file  . Intimate partner violence    Fear of current or ex partner: Not on file    Emotionally abused: Not on file    Physically abused: Not on file    Forced sexual activity: Not on file  Other Topics Concern  . Not on file  Social History Narrative   Right handed   One level home    Family History  Problem Relation Age of Onset   . CAD Father   . Heart attack Father   . Dementia Mother     ROS: no fevers or chills, productive cough, hemoptysis, dysphasia, odynophagia, melena, hematochezia, dysuria, hematuria, rash, seizure activity, orthopnea, PND, pedal edema, claudication. Remaining systems are negative.  Physical Exam: Well-developed well-nourished in no acute distress.  Skin is warm and dry.  HEENT is normal.  Neck is supple.  Chest is clear to auscultation with normal expansion.  Cardiovascular exam is regular rate and rhythm.  Abdominal exam nontender or distended. No masses palpated. Extremities show no edema. neuro grossly intact  ECG-sinus rhythm at a rate of 60.  No ST changes.  Personally reviewed  A/P  1 coronary calcification-patient denies chest pain.  Plan to continue medical therapy with aspirin and statin.  2 hypertension-patient's blood pressure is controlled.  She is concerned that low blood pressure may be contributing to dizziness but her daughter feels it is stress.  We discussed potentially discontinuing either metoprolol or amlodipine but she is hesitant.  We have agreed to continue present medications and follow blood pressure.  3 hyperlipidemia-continue statin.  4 palpitations-symptoms are reasonably well controlled.  Continue beta-blocker at present dose.  5 history of lymphoma-managed per oncology.  Kirk Ruths, MD

## 2019-02-04 DIAGNOSIS — E785 Hyperlipidemia, unspecified: Secondary | ICD-10-CM | POA: Diagnosis not present

## 2019-02-04 DIAGNOSIS — I10 Essential (primary) hypertension: Secondary | ICD-10-CM | POA: Diagnosis not present

## 2019-02-04 DIAGNOSIS — E039 Hypothyroidism, unspecified: Secondary | ICD-10-CM | POA: Diagnosis not present

## 2019-02-04 DIAGNOSIS — Z0000 Encounter for general adult medical examination without abnormal findings: Secondary | ICD-10-CM | POA: Diagnosis not present

## 2019-02-04 DIAGNOSIS — R739 Hyperglycemia, unspecified: Secondary | ICD-10-CM | POA: Diagnosis not present

## 2019-02-05 ENCOUNTER — Other Ambulatory Visit: Payer: Self-pay

## 2019-02-05 ENCOUNTER — Ambulatory Visit (INDEPENDENT_AMBULATORY_CARE_PROVIDER_SITE_OTHER): Payer: Medicare Other | Admitting: Cardiology

## 2019-02-05 ENCOUNTER — Encounter: Payer: Self-pay | Admitting: Cardiology

## 2019-02-05 VITALS — BP 118/68 | HR 60 | Ht 63.0 in | Wt 159.8 lb

## 2019-02-05 DIAGNOSIS — E7800 Pure hypercholesterolemia, unspecified: Secondary | ICD-10-CM

## 2019-02-05 DIAGNOSIS — I10 Essential (primary) hypertension: Secondary | ICD-10-CM

## 2019-02-05 DIAGNOSIS — I2510 Atherosclerotic heart disease of native coronary artery without angina pectoris: Secondary | ICD-10-CM | POA: Diagnosis not present

## 2019-02-05 NOTE — Patient Instructions (Signed)
Medication Instructions:  NO CHANGE If you need a refill on your cardiac medications before your next appointment, please call your pharmacy.   Lab work: If you have labs (blood work) drawn today and your tests are completely normal, you will receive your results only by: . MyChart Message (if you have MyChart) OR . A paper copy in the mail If you have any lab test that is abnormal or we need to change your treatment, we will call you to review the results.  Follow-Up: At [facility], you and your health needs are our priority.  As part of our continuing mission to provide you with exceptional heart care, we have created designated Provider Care Teams.  These Care Teams include your primary Cardiologist (physician) and Advanced Practice Providers (APPs -  Physician Assistants and Nurse Practitioners) who all work together to provide you with the care you need, when you need it. Your physician wants you to follow-up in: 6 MONTHS WITH DR CRENSHAW You will receive a reminder letter in the mail two months in advance. If you don't receive a letter, please call our office to schedule the follow-up appointment.      

## 2019-02-10 NOTE — Progress Notes (Signed)
HEMATOLOGY/ONCOLOGY CONSULTATION NOTE  Date of Service: 02/11/2019  Patient Care Team: Rachael Gravel, MD as PCP - General (Internal Medicine) Stanford Breed Rachael Bors, MD as PCP - Cardiology (Cardiology) Rachael Genera, MD as Consulting Physician (Hematology) Rachael Sauers, RN as Oncology Nurse Navigator (Oncology)  CHIEF COMPLAINTS/PURPOSE OF CONSULTATION:  Follicular lymphoma grade 3a  HISTORY OF PRESENTING ILLNESS:   Rachael Greer is a wonderful 78 y.o. female who has been referred to [name] by Dr. Jani Greer for evaluation and management of Follicular lymphoma. She is accompanied today by a friend, and our Nurse Navigator Rachael Greer. The pt reports that she is doing well overall.   The pt reports that things began changing last September in 2018. She notes that she has taken Flonase, Claritin, and saline solution and did not have a runny nose, and did not have any breathing difficulty. She notes that her head felt as though she had a flu or a cold, without any other symptoms. She notes that she felt as if her ears were swollen and has had some minimal hearing impairment, and has myringotomy tubes. She began a steroid course about 4 months ago with Dr. Maudie Greer. She notes that she first noticed right sided neck swelling in the last month, and left sided neck swelling which began 2-3 weeks ago.   She obtained a CT Neck on 03/08/18, as noted below.   The pt notes that she has not had fevers or chills, but has woken up recently with night sweats occasionally over the last 6 months. She adds that she has noticed some SOB in the last 7-8 months which presents intermittently, and has not progressed. The pt notes that she has had some difficulty swallowing in the past, and saw Dr. [name] for concerns of a stricture, and adds that this she has had recent difficulty swallowing. The pt also notes that she has recently felt dizziness, described as the room spinning. She has not had her dizziness worked up.   The pt notes that she had a mastectomy without radiation or chemotherapy for breast cancer in situ in 2013. She began tamoxifen preventatively afterwards for 2.5 years.   The pt notes that her interstitial cystitis cause is unknown. She notes that she took a TB vaccine 5-7 years ago, and developed severe pain and sepsis. She notes that she has not had recurrent UTIs and takes Elmiron under the care of Dr. Amalia Greer in urology. She notes that there are certain foods that she cannot eat without developing bladder pain. She also denies blood in the urine.  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 2. RIGHT lymphadenitis seen with superimposed infection or pericapsular spread of tumor.  Most recent lab results (03/08/18) of CBC w/diff and BMP is as follows: all values are WNL except for RBC at 5.22, HGB at 15.3.  On review of systems, pt reports some night sweats, some SOB, right neck swelling, left neck swelling, dizziness, hearing impairment, healed incision, right ear numbness, and denies fevers, chills, runny nose, abdominal pains, abdominal distension, blood in the urine, sinus pressure or pain, pain along the spine, leg swelling, skin rashes, and any other symptoms.   On PMHx the pt reports breast cancer in 2013, sternocleidomastoid release in childhood, interstitial cystitis, sepsis. On Social Hx the pt denies ever  smoking cigarettes and denies ever consuming alcohol  Interval History:  Rachael Greer is here today for management and evaluation of her Grade 3a Follicular Lymphoma, and after completing 3 cycles of BR. The patient's last visit with [name] was on 11/11/2018. The pt reports that she is doing well overall.  The pt reports that she is experiencing head  congestion and feels her ears "popping" quite often. It is bothersome to her and affects her hearing. She has spoken to sn ENT physician who has not been able to find the cause. Pt's memory has been failing lately. She has tried Aricept but it did not help with her memory. She is also dealing with dizziness, which is not due to blood pressure, and is believed to be caused by stress. She has been on another anti-anxiety medication which has improved her stress but not her memory. She notes that her mother had some form of dementia at her age.   Her home has been sold and she is currently in independent living. They do not have assistance services but she and her daughter are looking into long-term care options. Pt has had some issues eating well, as her living community does not offer enough food that fits within her diet. She has also had some falling episodes and has had to quit driving her car.   Pt is having an episode of Interstitial Cystitis and is currently taking low-dose Bactrim for that.   Of note since the patient's last visit, pt has had MR Brain w/o contrast completed on 12/24/2018 with results revealing "Aging brain with no specific or reversible finding to explain symptoms."  Lab results today (02/11/19) of CBC w/diff and CMP is as follows: all values are WNL except for RBC at 5.12, Hgb at 15.1, Glucose at 107, Calcium at 10.5, Total Bilirubin at 1.6, GFR Est Non Af Am at 55. 02/11/2019 LDH is 169  On review of systems, pt reports ear "popping", dizziness, memory loss and denies any other symptoms.   MEDICAL HISTORY:  Past Medical History:  Diagnosis Date  . Anxiety   . Breast cancer ([place])   . Chest pain   . Depression   . DOE (dyspnea on exertion)   . Eustachian tube obstruction, bilateral   . Fibromyalgia   . Gastric reflux   . History of kidney stones   . Hyperlipidemia   . Hypertension   . IBS (irritable bowel syndrome)   . Interstitial cystitis   . Nephrolithiasis   .  Nephrolithiasis   . Palpitations   . Pre-diabetes   . Sepsis ([place])     SURGICAL HISTORY: Past Surgical History:  Procedure Laterality Date  . BREAST DUCTAL SYSTEM EXCISION Right 10/30/2016   Procedure: RIGHT BREAST DUCT EXCISION;  Surgeon: Rolm Bookbinder, MD;  Location: [place];  Service: General;  Laterality: Right;  . EYE SURGERY     bilateral lense placements  . IR IMAGING GUIDED PORT INSERTION  07/12/2018  . MASTECTOMY Left   . NECK SURGERY     muscles cut in her neck  . NEPHROSTOMY    . TONSILLECTOMY      SOCIAL HISTORY: Social History   Socioeconomic History  . Marital status: Widowed    Spouse name: Not on file  . Number of children: 4  . Years of education: Not on file  . Highest education level: Not on file  Occupational History  . Not on file  Social Needs  . Financial resource strain: Not on  file  . Food insecurity    Worry: Not on file    Inability: Not on file  . Transportation needs    Medical: Not on file    Non-medical: Not on file  Tobacco Use  . Smoking status: Never Smoker  . Smokeless tobacco: Never Used  Substance and Sexual Activity  . Alcohol use: No  . Drug use: No  . Sexual activity: Not Currently  Lifestyle  . Physical activity    Days per week: Not on file    Minutes per session: Not on file  . Stress: Not on file  Relationships  . Social Herbalist on phone: Not on file    Gets together: Not on file    Attends religious service: Not on file    Active member of club or organization: Not on file    Attends meetings of clubs or organizations: Not on file    Relationship status: Not on file  . Intimate partner violence    Fear of current or ex partner: Not on file    Emotionally abused: Not on file    Physically abused: Not on file    Forced sexual activity: Not on file  Other Topics Concern  . Not on file  Social History Narrative   Right handed   One level home    FAMILY HISTORY: Family History  Problem  Relation Age of Onset  . CAD Father   . Heart attack Father   . Dementia Mother     ALLERGIES:  is allergic to amoxicillin and no known allergies.  MEDICATIONS:  Current Outpatient Medications  Medication Sig Dispense Refill  . amLODipine (NORVASC) 2.5 MG tablet Take 1 tablet (2.5 mg total) by mouth daily. 90 tablet 2  . aspirin EC 81 MG tablet Take 1 tablet (81 mg total) by mouth daily. 90 tablet 3  . atorvastatin (LIPITOR) 20 MG tablet Take 1 tablet (20 mg total) by mouth daily at 6 PM. (Patient taking differently: Take 20 mg by mouth once a week. ) 90 tablet 3  . citalopram (CELEXA) 10 MG tablet Take 1 tablet (10 mg total) by mouth 2 (two) times daily. 60 tablet 3  . clonazePAM (KLONOPIN) 1 MG tablet Take 1 mg by mouth 2 (two) times daily as needed for anxiety.     . famotidine (PEPCID) 20 MG tablet Take 20 mg by mouth daily.    . Ibuprofen-Diphenhydramine HCl (ADVIL PM) 200-25 MG CAPS Take 0.5-1 tablets by mouth daily as needed (sleep).     . metoprolol succinate (TOPROL-XL) 25 MG 24 hr tablet Take 25 mg by mouth every evening.    . pentosan polysulfate (ELMIRON) 100 MG capsule Take 100 mg by mouth 3 (three) times daily.      No current facility-administered medications for this visit.     REVIEW OF SYSTEMS:    A 10+ POINT REVIEW OF SYSTEMS WAS OBTAINED including neurology, dermatology, psychiatry, cardiac, respiratory, lymph, extremities, GI, GU, Musculoskeletal, constitutional, breasts, reproductive, HEENT.  All pertinent positives are noted in the HPI.  All others are negative.   PHYSICAL EXAMINATION:  Exam given in a chair .BP 124/62 (BP Location: Right Arm, Patient Position: Sitting)   Pulse 62   Temp 98.7 F (37.1 C) (Temporal)   Resp 18   Ht 5\' 3"  (1.6 m)   Wt 157 lb 9.6 oz (71.5 kg)   SpO2 96%   BMI 27.92 kg/m   GENERAL:alert, in no acute distress  and comfortable SKIN: no acute rashes, no significant lesions EYES: conjunctiva are pink and non-injected, sclera  anicteric OROPHARYNX: MMM, no exudates, no oropharyngeal erythema or ulceration NECK: supple, no JVD LYMPH:  no palpable lymphadenopathy in the cervical, axillary or inguinal regions LUNGS: clear to auscultation b/l with normal respiratory effort HEART: regular rate & rhythm ABDOMEN:  normoactive bowel sounds , non tender, not distended. No palpable hepatosplenomegaly.  Extremity: no pedal edema PSYCH: alert & oriented x 3 with fluent speech NEURO: no focal motor/sensory deficits  LABORATORY DATA:  I have reviewed the data as listed  . CBC Latest Ref Rng & Units 02/11/2019 10/23/2018 08/29/2018  WBC 4.0 - 10.5 K/uL 6.2 4.5 3.2(L)  Hemoglobin 12.0 - 15.0 g/dL 15.1(H) 14.4 13.0  Hematocrit 36.0 - 46.0 % 43.9 42.0 38.0  Platelets 150 - 400 K/uL 164 146(L) 121(L)   . CBC    Component Value Date/Time   WBC 6.2 02/11/2019 1145   RBC 5.12 (H) 02/11/2019 1145   HGB 15.1 (H) 02/11/2019 1145   HGB 16.3 (H) 11/02/2017 1529   HCT 43.9 02/11/2019 1145   HCT 46.7 (H) 11/02/2017 1529   PLT 164 02/11/2019 1145   PLT 192 11/02/2017 1529   MCV 85.7 02/11/2019 1145   MCV 85 11/02/2017 1529   MCH 29.5 02/11/2019 1145   MCHC 34.4 02/11/2019 1145   RDW 12.6 02/11/2019 1145   RDW 13.9 11/02/2017 1529   LYMPHSABS 1.1 02/11/2019 1145   LYMPHSABS 2.1 11/02/2017 1529   MONOABS 0.6 02/11/2019 1145   EOSABS 0.1 02/11/2019 1145   EOSABS 0.1 11/02/2017 1529   BASOSABS 0.0 02/11/2019 1145   BASOSABS 0.1 11/02/2017 1529     . CMP Latest Ref Rng & Units 02/11/2019 10/23/2018 08/29/2018  Glucose 70 - 99 mg/dL 107(H) 106(H) 90  BUN 8 - 23 mg/dL 19 19 13   Creatinine 0.44 - 1.00 mg/dL 0.98 0.91 0.77  Sodium 135 - 145 mmol/L 138 142 142  Potassium 3.5 - 5.1 mmol/L 4.3 4.0 4.0  Chloride 98 - 111 mmol/L 109 108 109  CO2 22 - 32 mmol/L 23 26 24   Calcium 8.9 - 10.3 mg/dL 10.5(H) 9.8 9.9  Total Protein 6.5 - 8.1 g/dL 6.8 6.8 6.4(L)  Total Bilirubin 0.3 - 1.2 mg/dL 1.6(H) 1.5(H) 1.8(H)  Alkaline Phos 38 -  126 U/L 79 86 82  AST 15 - 41 U/L 24 21 23   ALT 0 - 44 U/L 28 31 31     03/22/18 Flow Cytometry:   03/22/18 Biopsy:     RADIOGRAPHIC STUDIES: I have personally reviewed the radiological images as listed and agreed with the findings in the report. No results found.  ASSESSMENT & PLAN:   78 y.o. female with  1. 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 2. RIGHT lymphadenitis seen with superimposed infection or pericapsular spread of tumor.    03/22/18 biopsy revealed follicular lymphoma, Grade 3a   04/04/18 Hep B and Hep C negative   04/17/18 PET/CT revealed Notable bilateral adenopathy in the neck and posterior nasopharynx is hypermetabolic and mostly [place] 5 level activity, compatible with malignancy. 2. No pathologically enlarged or significantly hypermetabolic adenopathy in the chest, or abdomen/pelvis. No splenomegaly. Slight enlargement of the photopenic splenic cyst noted on remote prior Exams. 3. Other imaging findings of potential clinical significance: Aortic Atherosclerosis. Coronary atherosclerosis. Diffuse hepatic steatosis. Nonobstructive left nephrolithiasis.  S/p C1 with Rituxan alone, then 3 cycles of combined BR completed by 08/02/18. Reduced Bendamustine to 70mg /m2 for interstitial cystitis. Chose not to treat with R-CHOP given interstitial cystitis.  08/22/18 PET/CT revealed Interval resolution of bilateral hypermetabolic cervical and posterior nasopharyngeal adenopathy. No evidence for residual or recurrent metabolically active tumor. 2. Aortic Atherosclerosis. Lad coronary artery calcifications. 3. New nonspecific 5 mm subpleural nodule is noted within the posteromedial left upper lobe.   10/30/18 CT Maxillofacial which revealed "No recurrent mass in the face or pharynx.  Mastoid sinus clear bilaterally. Middle ear clear bilaterally. Paranasal sinuses clear."  10/30/18 CT Neck which revealed "Interval resolution of nasopharyngeal mass and cervical adenopathy bilaterally. No residual mass or adenopathy identified in the neck."  Discussed again the indications to consider completing maintenance Rituxan every 2 months, however considering her concerns with interstitial cystitis, concerns for memory and other medical priorities, do not feel it is unreasonable to hold off on maintenance Rituxan. Pt agrees with this and would like to pursue watchful observation.   2. Interstitial cystitis - stable. -Continue follow up with Urology Dr. Alona Bene for interstitial cystitis.  PLAN:  -Discussed pt labwork today, 02/11/19;  all values are WNL except for RBC at 5.12, Hgb at 15.1, Glucose at 107, Calcium at 10.5, Total Bilirubin at 1.6, GFR Est Non Af Am at 55. -Discussed 02/11/2019 LDH is 169 -The pt shows no clinical or lab progression/return of her Follicular lymphoma at this time.  -patient Is not a good candidate for maintenance Rituxan -Advised that we will continue to monitor with labs -Plan to remove port in the next 2 weeks -Will see back in 3-4 months with labs and PET/CT scan    FOLLOW UP: Port a cath removal in 2 weeks PET/CT in 16 weeks RTC with Dr Irene Limbo with labs in 4 months   The total time spent in the appt was 25 minutes and more than 50% was on counseling and direct patient cares.  All of the patient's questions were answered with apparent satisfaction. The patient knows to call the clinic with any problems, questions or concerns.    Sullivan Lone MD [place] AAHIVMS [facility] Hematology/Oncology Physician [facility]  (Office):       [phone] (Work cell):  [phone] (Fax):           [phone]  02/11/2019 12:43 PM  I, Yevette Edwards, am acting as a scribe for Dr. Sullivan Lone.   .I have reviewed the above documentation for  accuracy and completeness, and I agree with the above. Rachael Genera MD

## 2019-02-11 ENCOUNTER — Inpatient Hospital Stay: Payer: Medicare Other | Attending: Hematology

## 2019-02-11 ENCOUNTER — Inpatient Hospital Stay: Payer: Medicare Other | Admitting: Hematology

## 2019-02-11 ENCOUNTER — Telehealth: Payer: Self-pay | Admitting: Hematology

## 2019-02-11 ENCOUNTER — Other Ambulatory Visit: Payer: Self-pay

## 2019-02-11 VITALS — BP 124/62 | HR 62 | Temp 98.7°F | Resp 18 | Ht 63.0 in | Wt 157.6 lb

## 2019-02-11 DIAGNOSIS — Z853 Personal history of malignant neoplasm of breast: Secondary | ICD-10-CM | POA: Diagnosis not present

## 2019-02-11 DIAGNOSIS — Z9010 Acquired absence of unspecified breast and nipple: Secondary | ICD-10-CM | POA: Insufficient documentation

## 2019-02-11 DIAGNOSIS — K219 Gastro-esophageal reflux disease without esophagitis: Secondary | ICD-10-CM | POA: Insufficient documentation

## 2019-02-11 DIAGNOSIS — R918 Other nonspecific abnormal finding of lung field: Secondary | ICD-10-CM

## 2019-02-11 DIAGNOSIS — R1310 Dysphagia, unspecified: Secondary | ICD-10-CM | POA: Diagnosis not present

## 2019-02-11 DIAGNOSIS — C8238 Follicular lymphoma grade IIIa, lymph nodes of multiple sites: Secondary | ICD-10-CM

## 2019-02-11 DIAGNOSIS — Z9223 Personal history of estrogen therapy: Secondary | ICD-10-CM | POA: Diagnosis not present

## 2019-02-11 DIAGNOSIS — R911 Solitary pulmonary nodule: Secondary | ICD-10-CM

## 2019-02-11 DIAGNOSIS — Z79899 Other long term (current) drug therapy: Secondary | ICD-10-CM | POA: Insufficient documentation

## 2019-02-11 DIAGNOSIS — R61 Generalized hyperhidrosis: Secondary | ICD-10-CM | POA: Insufficient documentation

## 2019-02-11 DIAGNOSIS — R0981 Nasal congestion: Secondary | ICD-10-CM | POA: Diagnosis not present

## 2019-02-11 DIAGNOSIS — Z95828 Presence of other vascular implants and grafts: Secondary | ICD-10-CM

## 2019-02-11 DIAGNOSIS — M797 Fibromyalgia: Secondary | ICD-10-CM | POA: Insufficient documentation

## 2019-02-11 LAB — CMP (CANCER CENTER ONLY)
ALT: 28 U/L (ref 0–44)
AST: 24 U/L (ref 15–41)
Albumin: 4.1 g/dL (ref 3.5–5.0)
Alkaline Phosphatase: 79 U/L (ref 38–126)
Anion gap: 6 (ref 5–15)
BUN: 19 mg/dL (ref 8–23)
CO2: 23 mmol/L (ref 22–32)
Calcium: 10.5 mg/dL — ABNORMAL HIGH (ref 8.9–10.3)
Chloride: 109 mmol/L (ref 98–111)
Creatinine: 0.98 mg/dL (ref 0.44–1.00)
GFR, Est AFR Am: >60 mL/min (ref >60)
GFR, Estimated: 55 mL/min — ABNORMAL LOW (ref >60)
Glucose, Bld: 107 mg/dL — ABNORMAL HIGH (ref 70–99)
Potassium: 4.3 mmol/L (ref 3.5–5.1)
Sodium: 138 mmol/L (ref 135–145)
Total Bilirubin: 1.6 mg/dL — ABNORMAL HIGH (ref 0.3–1.2)
Total Protein: 6.8 g/dL (ref 6.5–8.1)

## 2019-02-11 LAB — CBC WITH DIFFERENTIAL/PLATELET
Abs Immature Granulocytes: 0.02 10*3/uL (ref 0.00–0.07)
Basophils Absolute: 0 10*3/uL (ref 0.0–0.1)
Basophils Relative: 1 %
Eosinophils Absolute: 0.1 10*3/uL (ref 0.0–0.5)
Eosinophils Relative: 2 %
HCT: 43.9 % (ref 36.0–46.0)
Hemoglobin: 15.1 g/dL — ABNORMAL HIGH (ref 12.0–15.0)
Immature Granulocytes: 0 %
Lymphocytes Relative: 18 %
Lymphs Abs: 1.1 10*3/uL (ref 0.7–4.0)
MCH: 29.5 pg (ref 26.0–34.0)
MCHC: 34.4 g/dL (ref 30.0–36.0)
MCV: 85.7 fL (ref 80.0–100.0)
Monocytes Absolute: 0.6 10*3/uL (ref 0.1–1.0)
Monocytes Relative: 10 %
Neutro Abs: 4.3 10*3/uL (ref 1.7–7.7)
Neutrophils Relative %: 69 %
Platelets: 164 10*3/uL (ref 150–400)
RBC: 5.12 MIL/uL — ABNORMAL HIGH (ref 3.87–5.11)
RDW: 12.6 % (ref 11.5–15.5)
WBC: 6.2 10*3/uL (ref 4.0–10.5)
nRBC: 0 % (ref 0.0–0.2)

## 2019-02-11 LAB — LACTATE DEHYDROGENASE: LDH: 169 U/L (ref 98–192)

## 2019-02-11 NOTE — Telephone Encounter (Signed)
Gave avs and calendar ° °

## 2019-02-12 DIAGNOSIS — I10 Essential (primary) hypertension: Secondary | ICD-10-CM | POA: Diagnosis not present

## 2019-02-12 DIAGNOSIS — Z0000 Encounter for general adult medical examination without abnormal findings: Secondary | ICD-10-CM | POA: Diagnosis not present

## 2019-02-28 ENCOUNTER — Other Ambulatory Visit: Payer: Self-pay | Admitting: Radiology

## 2019-03-03 ENCOUNTER — Other Ambulatory Visit: Payer: Self-pay

## 2019-03-03 ENCOUNTER — Ambulatory Visit (HOSPITAL_COMMUNITY)
Admission: RE | Admit: 2019-03-03 | Discharge: 2019-03-03 | Disposition: A | Discharge status: Home / Self Care | Payer: Medicare Other | Source: Ambulatory Visit | Attending: Hematology | Admitting: Hematology

## 2019-03-03 ENCOUNTER — Encounter (HOSPITAL_COMMUNITY): Payer: Self-pay

## 2019-03-03 DIAGNOSIS — Z8572 Personal history of non-Hodgkin lymphomas: Secondary | ICD-10-CM | POA: Insufficient documentation

## 2019-03-03 DIAGNOSIS — E785 Hyperlipidemia, unspecified: Secondary | ICD-10-CM | POA: Diagnosis not present

## 2019-03-03 DIAGNOSIS — Z853 Personal history of malignant neoplasm of breast: Secondary | ICD-10-CM | POA: Insufficient documentation

## 2019-03-03 DIAGNOSIS — Z7982 Long term (current) use of aspirin: Secondary | ICD-10-CM | POA: Diagnosis not present

## 2019-03-03 DIAGNOSIS — C8238 Follicular lymphoma grade IIIa, lymph nodes of multiple sites: Secondary | ICD-10-CM

## 2019-03-03 DIAGNOSIS — I10 Essential (primary) hypertension: Secondary | ICD-10-CM | POA: Insufficient documentation

## 2019-03-03 DIAGNOSIS — M797 Fibromyalgia: Secondary | ICD-10-CM | POA: Diagnosis not present

## 2019-03-03 DIAGNOSIS — Z79899 Other long term (current) drug therapy: Secondary | ICD-10-CM | POA: Insufficient documentation

## 2019-03-03 DIAGNOSIS — F329 Major depressive disorder, single episode, unspecified: Secondary | ICD-10-CM | POA: Diagnosis not present

## 2019-03-03 DIAGNOSIS — R7303 Prediabetes: Secondary | ICD-10-CM | POA: Diagnosis not present

## 2019-03-03 DIAGNOSIS — F419 Anxiety disorder, unspecified: Secondary | ICD-10-CM | POA: Insufficient documentation

## 2019-03-03 DIAGNOSIS — Z452 Encounter for adjustment and management of vascular access device: Secondary | ICD-10-CM | POA: Diagnosis not present

## 2019-03-03 DIAGNOSIS — K589 Irritable bowel syndrome without diarrhea: Secondary | ICD-10-CM | POA: Insufficient documentation

## 2019-03-03 DIAGNOSIS — Z95828 Presence of other vascular implants and grafts: Secondary | ICD-10-CM

## 2019-03-03 HISTORY — PX: IR REMOVAL TUN ACCESS W/ PORT W/O FL MOD SED: IMG2290

## 2019-03-03 LAB — PROTIME-INR
INR: 1 (ref 0.8–1.2)
Prothrombin Time: 13.1 seconds (ref 11.4–15.2)

## 2019-03-03 LAB — CBC
HCT: 44 % (ref 36.0–46.0)
Hemoglobin: 14.6 g/dL (ref 12.0–15.0)
MCH: 29.8 pg (ref 26.0–34.0)
MCHC: 33.2 g/dL (ref 30.0–36.0)
MCV: 89.8 fL (ref 80.0–100.0)
Platelets: 152 10*3/uL (ref 150–400)
RBC: 4.9 MIL/uL (ref 3.87–5.11)
RDW: 12.7 % (ref 11.5–15.5)
WBC: 5.1 10*3/uL (ref 4.0–10.5)
nRBC: 0 % (ref 0.0–0.2)

## 2019-03-03 MED ORDER — VANCOMYCIN HCL IN DEXTROSE 1-5 GM/200ML-% IV SOLN
1000.0000 mg | INTRAVENOUS | Status: AC
  Administered 2019-03-03: 12:00:00 1000 mg via INTRAVENOUS

## 2019-03-03 MED ORDER — MIDAZOLAM HCL 2 MG/2ML IJ SOLN
INTRAMUSCULAR | Status: AC
  Filled 2019-03-03: qty 2

## 2019-03-03 MED ORDER — VANCOMYCIN HCL IN DEXTROSE 1-5 GM/200ML-% IV SOLN
INTRAVENOUS | Status: AC
  Administered 2019-03-03: 1000 mg via INTRAVENOUS
  Filled 2019-03-03: qty 200

## 2019-03-03 MED ORDER — FENTANYL CITRATE (PF) 100 MCG/2ML IJ SOLN
INTRAMUSCULAR | Status: AC | PRN
  Administered 2019-03-03 (×2): 50 ug via INTRAVENOUS

## 2019-03-03 MED ORDER — LIDOCAINE-EPINEPHRINE (PF) 1 %-1:200000 IJ SOLN
INTRAMUSCULAR | Status: AC | PRN
  Administered 2019-03-03: 10 mL

## 2019-03-03 MED ORDER — SODIUM CHLORIDE 0.9 % IV SOLN
INTRAVENOUS | Status: DC
  Administered 2019-03-03: 11:00:00 via INTRAVENOUS

## 2019-03-03 MED ORDER — LIDOCAINE-EPINEPHRINE 1 %-1:100000 IJ SOLN
INTRAMUSCULAR | Status: AC
  Filled 2019-03-03: qty 1

## 2019-03-03 MED ORDER — MIDAZOLAM HCL 2 MG/2ML IJ SOLN
INTRAMUSCULAR | Status: AC | PRN
  Administered 2019-03-03 (×2): 1 mg via INTRAVENOUS

## 2019-03-03 MED ORDER — FENTANYL CITRATE (PF) 100 MCG/2ML IJ SOLN
INTRAMUSCULAR | Status: AC
  Filled 2019-03-03: qty 2

## 2019-03-03 NOTE — Procedures (Signed)
Interventional Radiology Procedure Note  Procedure: Successful explantation of right chest portacatheter.   Complications: None  Estimated Blood Loss: None  Recommendations: - DC home  Signed,  Heath K. McCullough, MD    

## 2019-03-03 NOTE — H&P (Signed)
Referring Physician(s): [name]  Supervising Physician: Jacqulynn Cadet  Patient Status:  Rachael Greer  Chief Complaint:  "I'm getting my port out"  Subjective: Patient familiar to IR service from right nephrostomy in 2013 and Port-A-Cath placement on 07/12/2018.  She has a history of high-grade follicular lymphoma and has completed treatment.  She is currently in clinical remission.  She presents today for Port-A-Cath removal.  She currently denies fever, headache, chest pain, cough, abdominal/back pain, nausea, vomiting or bleeding.  She does have some dyspnea with exertion and occasional burning with urination due to interstitial cystitis.  Past Medical History:  Diagnosis Date  . Anxiety   . Breast cancer ([place])   . Chest pain   . Depression   . DOE (dyspnea on exertion)   . Dyspnea    exertion  . Eustachian tube obstruction, bilateral   . Fibromyalgia   . Gastric reflux   . History of kidney stones   . Hyperlipidemia   . Hypertension   . IBS (irritable bowel syndrome)   . Interstitial cystitis   . Nephrolithiasis   . Nephrolithiasis   . Palpitations   . Pre-diabetes   . Sepsis [facility])    Past Surgical History:  Procedure Laterality Date  . BREAST DUCTAL SYSTEM EXCISION Right 10/30/2016   Procedure: RIGHT BREAST DUCT EXCISION;  Surgeon: Rolm Bookbinder, MD;  Location: [place];  Service: General;  Laterality: Right;  . EYE SURGERY     bilateral lense placements  . IR IMAGING GUIDED PORT INSERTION  07/12/2018  . MASTECTOMY Left   . NECK SURGERY     muscles cut in her neck  . NEPHROSTOMY    . TONSILLECTOMY         Allergies: Amoxicillin and No known allergies  Medications: Prior to Admission medications   Medication Sig Start Date End Date Taking? Authorizing Provider  amLODipine (NORVASC) 2.5 MG tablet Take 1 tablet (2.5 mg total) by mouth daily. 09/23/18  Yes Lelon Perla, MD  aspirin EC 81 MG tablet Take 1 tablet (81 mg total) by mouth  daily. 05/15/18  Yes Lelon Perla, MD  atorvastatin (LIPITOR) 20 MG tablet Take 1 tablet (20 mg total) by mouth daily at 6 PM. Patient taking differently: Take 20 mg by mouth once a week.  05/15/18  Yes Lelon Perla, MD  citalopram (CELEXA) 10 MG tablet Take 1 tablet (10 mg total) by mouth 2 (two) times daily. 01/24/19  Yes Cameron Sprang, MD  clonazePAM (KLONOPIN) 1 MG tablet Take 1 mg by mouth 2 (two) times daily as needed for anxiety.  03/31/16  Yes [provider]  famotidine (PEPCID) 20 MG tablet Take 20 mg by mouth daily.   Yes [provider]  Ibuprofen-Diphenhydramine HCl (ADVIL PM) 200-25 MG CAPS Take 0.5-1 tablets by mouth daily as needed (sleep).    Yes [provider]  metoprolol succinate (TOPROL-XL) 25 MG 24 hr tablet Take 25 mg by mouth every evening.   Yes [provider]  pentosan polysulfate (ELMIRON) 100 MG capsule Take 100 mg by mouth 3 (three) times daily.    Yes [provider]     Vital Signs: BP 119/66   Pulse (!) 55   Temp 97.9 F (36.6 C) (Oral)   Resp 16   SpO2 99%   Physical Exam awake, alert.  Chest clear to auscultation bilaterally.  Clean, intact right chest wall Port-A-Cath.  Heart with bradycardic but regular rhythm.  Abdomen soft,  positive bowel sounds, nontender.  No lower extremity edema.  Imaging: No results found.  Labs:  CBC: Recent Labs    08/29/18 0822 10/23/18 1127 02/11/19 1145 03/03/19 1019  WBC 3.2* 4.5 6.2 5.1  HGB 13.0 14.4 15.1* 14.6  HCT 38.0 42.0 43.9 44.0  PLT 121* 146* 164 152    COAGS: Recent Labs    07/12/18 1355  INR 0.89  APTT 27    BMP: Recent Labs    08/01/18 0813 08/29/18 0822 10/23/18 1127 02/11/19 1145  NA 141 142 142 138  K 3.9 4.0 4.0 4.3  CL 108 109 108 109  CO2 23 24 26 23   GLUCOSE 104* 90 106* 107*  BUN 13 13 19 19   CALCIUM 10.2 9.9 9.8 10.5*  CREATININE 0.99 0.77 0.91 0.98  GFRNONAA 55* >60 >60 55*  GFRAA >60 >60 >60 >60    LIVER  FUNCTION TESTS: Recent Labs    08/01/18 0813 08/29/18 0822 10/23/18 1127 02/11/19 1145  BILITOT 1.8* 1.8* 1.5* 1.6*  AST 26 23 21 24   ALT 27 31 31 28   ALKPHOS 95 82 86 79  PROT 6.8 6.4* 6.8 6.8  ALBUMIN 4.0 3.8 3.9 4.1    Assessment and Plan: Pt with history of high-grade follicular lymphoma , s/p completion of treatment.  She is currently in clinical remission.  She presents today for Port-A-Cath removal.  Details/risks of procedure, including but not limited to, internal bleeding, infection, injury to adjacent structures discussed with patient with her understanding and consent.    Electronically Signed: D. Rowe Robert, PA-C 03/03/2019, 11:17 AM   I spent a total of 20 minutes at the the patient's bedside AND on the patient's hospital floor or unit, greater than 50% of which was counseling/coordinating care for Port-A-Cath removal

## 2019-03-03 NOTE — Discharge Instructions (Signed)
Implanted [place], Care After °This sheet gives you information about how to care for yourself after your procedure. Your health care provider may also give you more specific instructions. If you have problems or questions, contact your health care provider. °What can I expect after the procedure? °After the procedure, it is common to have: °· Soreness or pain near your incision. °· Some swelling or bruising near your incision. °Follow these instructions at home: °Medicines °· Take over-the-counter and prescription medicines only as told by your health care provider. °· If you were prescribed an antibiotic medicine, take it as told by your health care provider. Do not stop taking the antibiotic even if you start to feel better. °Bathing °· Do not take baths, swim, or use a hot tub until your health care provider approves. Ask your health care provider if you can take showers. You may only be allowed to take sponge baths. °Incision care ° °· Follow instructions from your health care provider about how to take care of your incision. Make sure you: °? Wash your hands with soap and water before you change your bandage (dressing). If soap and water are not available, use hand sanitizer. °? Change your dressing as told by your health care provider. °? Keep your dressing dry. °? Leave stitches (sutures), skin glue, or adhesive strips in place. These skin closures may need to stay in place for 2 weeks or longer. If adhesive strip edges start to loosen and curl up, you may trim the loose edges. Do not remove adhesive strips completely unless your health care provider tells you to do that. °· Check your incision area every day for signs of infection. Check for: °? More redness, swelling, or pain. °? More fluid or blood. °? Warmth. °? Pus or a bad smell. °Driving ° °· Do not drive for 24 hours if you were given a medicine to help you relax (sedative) during your procedure. °· If you did not receive a sedative, ask your  health care provider when it is safe to drive. °Activity °· Return to your normal activities as told by your health care provider. Ask your health care provider what activities are safe for you. °· Do not lift anything that is heavier than 10 lb (4.5 kg), or the limit that you are told, until your health care provider says that it is safe. °· Do not do activities that involve lifting your arms over your head. °General instructions °· Do not use any products that contain nicotine or tobacco, such as cigarettes and e-cigarettes. These can delay healing. If you need help quitting, ask your health care provider. °· Keep all follow-up visits as told by your health care provider. This is important. °Contact a health care provider if: °· You have more redness, swelling, or pain around your incision. °· You have more fluid or blood coming from your incision. °· Your incision feels warm to the touch. °· You have pus or a bad smell coming from your incision. °· You have pain that is not relieved by your pain medicine. °Get help right away if you have: °· A fever or chills. °· Chest pain. °· Difficulty breathing. °Summary °· After the procedure, it is common to have pain, soreness, swelling, or bruising near your incision. °· If you were prescribed an antibiotic medicine, take it as told by your health care provider. Do not stop taking the antibiotic even if you start to feel better. °· Do not drive for 24 hours   if you were given a sedative during your procedure. °· Return to your normal activities as told by your health care provider. Ask your health care provider what activities are safe for you. °This information is not intended to replace advice given to you by your health care provider. Make sure you discuss any questions you have with your health care provider. °Document Released: 05/10/2015 Document Revised: 07/12/2017 Document Reviewed: 07/12/2017 °Elsevier Patient Education © 2020 [employer] ° ° ° °Moderate  Conscious Sedation, Adult, Care After °These instructions provide you with information about caring for yourself after your procedure. Your health care provider may also give you more specific instructions. Your treatment has been planned according to current medical practices, but problems sometimes occur. Call your health care provider if you have any problems or questions after your procedure. °What can I expect after the procedure? °After your procedure, it is common: °· To feel sleepy for several hours. °· To feel clumsy and have poor balance for several hours. °· To have poor judgment for several hours. °· To vomit if you eat too soon. °Follow these instructions at home: °For at least 24 hours after the procedure: ° °· Do not: °? Participate in activities where you could fall or become injured. °? Drive. °? Use heavy machinery. °? Drink alcohol. °? Take sleeping pills or medicines that cause drowsiness. °? Make important decisions or sign legal documents. °? Take care of children on your own. °· Rest. °Eating and drinking °· Follow the diet recommended by your health care provider. °· If you vomit: °? Drink water, juice, or soup when you can drink without vomiting. °? Make sure you have little or no nausea before eating solid foods. °General instructions °· Have a responsible adult stay with you until you are awake and alert. °· Take over-the-counter and prescription medicines only as told by your health care provider. °· If you smoke, do not smoke without supervision. °· Keep all follow-up visits as told by your health care provider. This is important. °Contact a health care provider if: °· You keep feeling nauseous or you keep vomiting. °· You feel light-headed. °· You develop a rash. °· You have a fever. °Get help right away if: °· You have trouble breathing. °This information is not intended to replace advice given to you by your health care provider. Make sure you discuss any questions you have with your  health care provider. °Document Released: 03/19/2013 Document Revised: 05/11/2017 Document Reviewed: 09/18/2015 °Elsevier Patient Education © 2020 Else[employer] Inc. ° °

## 2019-03-19 ENCOUNTER — Telehealth: Payer: Self-pay | Admitting: Neurology

## 2019-03-19 NOTE — Telephone Encounter (Signed)
She thinks patient is coming off of withdrawals from the Clonazepam medication- Celexa; she is shaking, very anxious and stressed/ nervous, she has some night sweats, she is making herself not take the medication but she is really wanting to take it because its so bad. Thanks!

## 2019-03-20 NOTE — Telephone Encounter (Signed)
[name] states that pt has been taking Klonopin for years due to anxiety. Pt would take 2 pills daily. The pt has been able to wean down to 1/2 a pill and has been doing this for two weeks. However, the pts anxiety has become worse. She continues to take Citalopram 10mg  BID.

## 2019-03-21 ENCOUNTER — Other Ambulatory Visit: Payer: Self-pay

## 2019-03-21 MED ORDER — BUSPIRONE HCL 5 MG PO TABS: ORAL_TABLET | ORAL | 3 refills | Status: DC

## 2019-03-21 MED ORDER — BUSPIRONE HCL 5 MG PO TABS: 5.0000 mg | ORAL_TABLET | Freq: Two times a day (BID) | ORAL | 3 refills | Status: DC

## 2019-03-21 NOTE — Telephone Encounter (Signed)
If she is agreeable to starting a different medication for anxiety called Buspar, we can start at a low dose and slowly increase if she is tolerating it. Main side effect can be drowsiness and dizziness, so start taking Buspar 5mg  every night for 3 days, then increase to 1 tab BID. Thanks

## 2019-03-21 NOTE — Telephone Encounter (Signed)
Does she need to d/c Klonopin and Citalopram? Only take Buspar?

## 2019-03-21 NOTE — Telephone Encounter (Signed)
Per [name] pt should stay on the low dose Klonopin as well as the Citalopram. Pt will add the 5mg  Buspar. After 2 weeks or sooner if needed [name] will call the office back with an update.  If pt is doing well per Dr Delice Lesch pt may stop the Klonopin and stay on Citalopram and buspar.  New prescription sent for Buspar as instructed by [name].

## 2019-04-02 ENCOUNTER — Encounter: Payer: Medicare Other | Admitting: Psychology

## 2019-04-07 ENCOUNTER — Encounter: Payer: Self-pay | Admitting: Neurology

## 2019-04-10 ENCOUNTER — Encounter: Payer: Medicare Other | Admitting: Psychology

## 2019-04-16 DIAGNOSIS — D229 Melanocytic nevi, unspecified: Secondary | ICD-10-CM | POA: Diagnosis not present

## 2019-04-16 DIAGNOSIS — L719 Rosacea, unspecified: Secondary | ICD-10-CM | POA: Diagnosis not present

## 2019-04-16 DIAGNOSIS — L821 Other seborrheic keratosis: Secondary | ICD-10-CM | POA: Diagnosis not present

## 2019-04-29 ENCOUNTER — Encounter: Payer: Self-pay | Admitting: Neurology

## 2019-04-29 ENCOUNTER — Ambulatory Visit: Payer: Medicare Other | Admitting: Neurology

## 2019-04-29 ENCOUNTER — Other Ambulatory Visit: Payer: Self-pay

## 2019-04-29 VITALS — BP 119/59 | HR 69 | Ht 63.0 in | Wt 165.0 lb

## 2019-04-29 DIAGNOSIS — F0390 Unspecified dementia without behavioral disturbance: Secondary | ICD-10-CM | POA: Diagnosis not present

## 2019-04-29 DIAGNOSIS — F03A0 Unspecified dementia, mild, without behavioral disturbance, psychotic disturbance, mood disturbance, and anxiety: Secondary | ICD-10-CM

## 2019-04-29 MED ORDER — RIVASTIGMINE TARTRATE 1.5 MG PO CAPS: 1.5000 mg | ORAL_CAPSULE | Freq: Two times a day (BID) | ORAL | 11 refills | Status: DC

## 2019-04-29 NOTE — Patient Instructions (Addendum)
1. Start Exelon 1.5mg  twice a day  2. Continue taking the clonazepam 1/2 tablet every night  3. I would recommend we proceed Neurocognitive testing as we discussed. Our office will call you with an appointment date and time.  4. Recommend having vision checked  5. Follow-up in 6 months, call for any changes  FALL PRECAUTIONS: Be cautious when walking. Scan the area for obstacles that may increase the risk of trips and falls. When getting up in the mornings, sit up at the edge of the bed for a few minutes before getting out of bed. Consider elevating the bed at the head end to avoid drop of blood pressure when getting up. Walk always in a well-lit room (use night lights in the walls). Avoid area rugs or power cords from appliances in the middle of the walkways. Use a walker or a cane if necessary and consider physical therapy for balance exercise. Get your eyesight checked regularly.  FINANCIAL OVERSIGHT: Supervision, especially oversight when making financial decisions or transactions is also recommended.  HOME SAFETY: Consider the safety of the kitchen when operating appliances like stoves, microwave oven, and blender. Consider having supervision and share cooking responsibilities until no longer able to participate in those. Accidents with firearms and other hazards in the house should be identified and addressed as well.  DRIVING: Regarding driving, in patients with progressive memory problems, driving will be impaired. We advise to have someone else do the driving if trouble finding directions or if minor accidents are reported. Independent driving assessment is available to determine safety of driving.  ABILITY TO BE LEFT ALONE: If patient is unable to contact 911 operator, consider using LifeLine, or when the need is there, arrange for someone to stay with patients. Smoking is a fire hazard, consider supervision or cessation. Risk of wandering should be assessed by caregiver and if detected  at any point, supervision and safe proof recommendations should be instituted.  MEDICATION SUPERVISION: Inability to self-administer medication needs to be constantly addressed. Implement a mechanism to ensure safe administration of the medications.  RECOMMENDATIONS FOR ALL PATIENTS WITH MEMORY PROBLEMS: 1. Continue to exercise (Recommend 30 minutes of walking everyday, or 3 hours every week) 2. Increase social interactions - continue going to [place] and enjoy social gatherings with friends and family 3. Eat healthy, avoid fried foods and eat more fruits and vegetables 4. Maintain adequate blood pressure, blood sugar, and blood cholesterol level. Reducing the risk of stroke and cardiovascular disease also helps promoting better memory. 5. Avoid stressful situations. Live a simple life and avoid aggravations. Organize your time and prepare for the next day in anticipation. 6. Sleep well, avoid any interruptions of sleep and avoid any distractions in the bedroom that may interfere with adequate sleep quality 7. Avoid sugar, avoid sweets as there is a strong link between excessive sugar intake, diabetes, and cognitive impairment The Mediterranean diet has been shown to help patients reduce the risk of progressive memory disorders and reduces cardiovascular risk. This includes eating fish, eat fruits and green leafy vegetables, nuts like almonds and hazelnuts, walnuts, and also use olive oil. Avoid fast foods and fried foods as much as possible. Avoid sweets and sugar as sugar use has been linked to worsening of memory function.  There is always a concern of gradual progression of memory problems. If this is the case, then we may need to adjust level of care according to patient needs. Support, both to the patient and caregiver, should then be put  into place.
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
## 2019-05-02 ENCOUNTER — Ambulatory Visit: Payer: Medicare Other

## 2019-05-06 ENCOUNTER — Telehealth: Payer: Self-pay | Admitting: Neurology

## 2019-05-06 NOTE — Telephone Encounter (Signed)
Patient states that she needs to talk to someone about her new medication. It may be to strong for her. Please call she could not give me the name of the medication

## 2019-05-06 NOTE — Telephone Encounter (Signed)
Left message for pt to return call.

## 2019-05-07 NOTE — Telephone Encounter (Signed)
Spoke with patient and she says she just stopped taking the exelon.  She said it made her not want to do anything and she couldn't keep her eyes open.

## 2019-05-07 NOTE — Telephone Encounter (Signed)
Noted, thanks!

## 2019-05-12 ENCOUNTER — Ambulatory Visit: Payer: Medicare Other | Admitting: Podiatry

## 2019-05-12 DIAGNOSIS — R945 Abnormal results of liver function studies: Secondary | ICD-10-CM | POA: Diagnosis not present

## 2019-05-12 DIAGNOSIS — Z79899 Other long term (current) drug therapy: Secondary | ICD-10-CM | POA: Diagnosis not present

## 2019-05-13 ENCOUNTER — Ambulatory Visit: Payer: Medicare Other | Admitting: Podiatry

## 2019-05-13 DIAGNOSIS — N3010 Interstitial cystitis (chronic) without hematuria: Secondary | ICD-10-CM | POA: Diagnosis not present

## 2019-05-13 DIAGNOSIS — R8290 Unspecified abnormal findings in urine: Secondary | ICD-10-CM | POA: Diagnosis not present

## 2019-05-13 DIAGNOSIS — R82998 Other abnormal findings in urine: Secondary | ICD-10-CM | POA: Diagnosis not present

## 2019-05-15 ENCOUNTER — Ambulatory Visit (INDEPENDENT_AMBULATORY_CARE_PROVIDER_SITE_OTHER): Payer: Medicare Other | Admitting: Podiatry

## 2019-05-15 ENCOUNTER — Other Ambulatory Visit: Payer: Self-pay

## 2019-05-15 DIAGNOSIS — L603 Nail dystrophy: Secondary | ICD-10-CM

## 2019-05-15 DIAGNOSIS — S90222A Contusion of left lesser toe(s) with damage to nail, initial encounter: Secondary | ICD-10-CM

## 2019-05-19 DIAGNOSIS — I10 Essential (primary) hypertension: Secondary | ICD-10-CM | POA: Diagnosis not present

## 2019-05-19 DIAGNOSIS — E785 Hyperlipidemia, unspecified: Secondary | ICD-10-CM | POA: Diagnosis not present

## 2019-05-19 DIAGNOSIS — R945 Abnormal results of liver function studies: Secondary | ICD-10-CM | POA: Diagnosis not present

## 2019-05-20 ENCOUNTER — Telehealth: Payer: Self-pay | Admitting: *Deleted

## 2019-05-20 DIAGNOSIS — E7800 Pure hypercholesterolemia, unspecified: Secondary | ICD-10-CM

## 2019-05-20 MED ORDER — ATORVASTATIN CALCIUM 20 MG PO TABS: 20.0000 mg | ORAL_TABLET | Freq: Every day | ORAL | 1 refills | Status: DC

## 2019-05-20 NOTE — Progress Notes (Signed)
Subjective:   Patient ID: Rachael Greer, female   DOB: 78 y.o.   MRN: [id number]   HPI 78 year old female presents the office today for concerns of discoloration to her left big toenail, subungual hematoma.  She states that she had injury about 3 months ago she noticed the blood.  She said the nail has been coming out although slowly but seems to have stopped some.  She denies any pain in the nail she denies any redness or drainage or any swelling.  She does notice that both of her big toenails do split at times.  Review of Systems  All other systems reviewed and are negative.   Past Medical History:  Diagnosis Date  . Anxiety   . Breast cancer ([place])   . Chest pain   . Depression   . DOE (dyspnea on exertion)   . Dyspnea    exertion  . Eustachian tube obstruction, bilateral   . Fibromyalgia   . Gastric reflux   . History of kidney stones   . Hyperlipidemia   . Hypertension   . IBS (irritable bowel syndrome)   . Interstitial cystitis   . Nephrolithiasis   . Nephrolithiasis   . Palpitations   . Pre-diabetes   . Sepsis [facility])     Past Surgical History:  Procedure Laterality Date  . BREAST DUCTAL SYSTEM EXCISION Right 10/30/2016   Procedure: RIGHT BREAST DUCT EXCISION;  Surgeon: Rolm Bookbinder, MD;  Location: [place];  Service: General;  Laterality: Right;  . EYE SURGERY     bilateral lense placements  . IR IMAGING GUIDED PORT INSERTION  07/12/2018  . IR REMOVAL TUN ACCESS W/ PORT W/O FL MOD SED  03/03/2019  . MASTECTOMY Left   . NECK SURGERY     muscles cut in her neck  . NEPHROSTOMY    . TONSILLECTOMY       Current Outpatient Medications:  .  amLODipine (NORVASC) 2.5 MG tablet, Take 1 tablet (2.5 mg total) by mouth daily., Disp: 90 tablet, Rfl: 2 .  aspirin EC 81 MG tablet, Take 1 tablet (81 mg total) by mouth daily., Disp: 90 tablet, Rfl: 3 .  atorvastatin (LIPITOR) 20 MG tablet, Take 1 tablet (20 mg total) by mouth daily at 6 PM. (Patient taking differently: Take 20  mg by mouth once a week. ), Disp: 90 tablet, Rfl: 3 .  citalopram (CELEXA) 10 MG tablet, Take 1 tablet (10 mg total) by mouth 2 (two) times daily., Disp: 60 tablet, Rfl: 3 .  clonazePAM (KLONOPIN) 1 MG tablet, Take 1 mg by mouth 2 (two) times daily as needed for anxiety. , Disp: , Rfl:  .  famotidine (PEPCID) 20 MG tablet, Take 20 mg by mouth daily., Disp: , Rfl:  .  Ibuprofen-Diphenhydramine HCl (ADVIL PM) 200-25 MG CAPS, Take 0.5-1 tablets by mouth daily as needed (sleep). , Disp: , Rfl:  .  metoprolol succinate (TOPROL-XL) 25 MG 24 hr tablet, Take 25 mg by mouth every evening., Disp: , Rfl:  .  pentosan polysulfate (ELMIRON) 100 MG capsule, Take 100 mg by mouth 3 (three) times daily. , Disp: , Rfl:  .  rivastigmine (EXELON) 1.5 MG capsule, Take 1 capsule (1.5 mg total) by mouth 2 (two) times daily., Disp: 60 capsule, Rfl: 11  Allergies  Allergen Reactions  . Amoxicillin Rash  . No Known Allergies         Objective:  Physical Exam  General: AAO x3, NAD  Dermatological: Bilateral hallux toenails are  dystrophic with some mild thickening and brown discoloration.  The left hallux there is subungual hematoma.  It does appear that the new nail growing out approximately the base about 20%.  The nail seems to be adhered to the nailbed.  Mild incurvation of the nail borders.  There is no edema, erythema, drainage or pus or any signs of infection.  No open lesions.  Vascular: Dorsalis Pedis artery and Posterior Tibial artery pedal pulses are 2/4 bilateral with immedate capillary fill time.  There is no pain with calf compression, swelling, warmth, erythema.   Neruologic: Grossly intact via light touch bilateral. Vibratory intact via tuning fork bilateral. Protective threshold with Semmes Wienstein monofilament intact to all pedal sites bilateral. Patellar and Achilles deep tendon reflexes 2+ bilateral. No Babinski or clonus noted bilateral.   Musculoskeletal: Tailors bunions present bilaterally  which are asymptomatic.  Muscular strength 5/5 in all groups tested bilateral.  Gait: Unassisted, Nonantalgic.       Assessment:   Subungual hematoma left hallux with onychodystrophy bilateral hallux     Plan:  -Treatment options discussed including all alternatives, risks, and complications -Etiology of symptoms were discussed -We discussed nail removal.  She wished to hold off on this.  We discussed Epson salt soaks as well as antibiotic ointment on the nail to help with the infection.  Monitoring signs or symptoms of infection and should any occur showing of the nail removed in toto likely.  Discussed with her also that the blood grows out we can do partial nail avulsions given the chronic ingrowing of the nail.  Return if symptoms worsen or fail to improve.  Trula Slade DPM

## 2019-05-20 NOTE — Telephone Encounter (Signed)
Rx refill sent to pharmacy. 

## 2019-05-22 ENCOUNTER — Telehealth: Payer: Self-pay | Admitting: *Deleted

## 2019-05-22 MED ORDER — AMLODIPINE BESYLATE 2.5 MG PO TABS: 2.5000 mg | ORAL_TABLET | Freq: Every day | ORAL | 0 refills | Status: DC

## 2019-05-22 NOTE — Telephone Encounter (Signed)
*  STAT* If patient is at the pharmacy, call can be transferred to refill team.   1. Which medications need to be refilled? (please list name of each medication and dose if known) Amlodipine 2.5 mg, qd  2. Which pharmacy/location (including street and city if local pharmacy) is medication to be sent to?[facility] on [address]  3. Do they need a 30 day or 90 day supply? [place]

## 2019-06-03 ENCOUNTER — Other Ambulatory Visit: Payer: Self-pay

## 2019-06-03 ENCOUNTER — Ambulatory Visit (HOSPITAL_COMMUNITY)
Admission: RE | Admit: 2019-06-03 | Discharge: 2019-06-03 | Disposition: A | Discharge status: Home / Self Care | Payer: Medicare Other | Source: Ambulatory Visit | Attending: Hematology | Admitting: Hematology

## 2019-06-03 DIAGNOSIS — Z79899 Other long term (current) drug therapy: Secondary | ICD-10-CM | POA: Diagnosis not present

## 2019-06-03 DIAGNOSIS — R911 Solitary pulmonary nodule: Secondary | ICD-10-CM | POA: Diagnosis not present

## 2019-06-03 DIAGNOSIS — C8590 Non-Hodgkin lymphoma, unspecified, unspecified site: Secondary | ICD-10-CM | POA: Diagnosis not present

## 2019-06-03 DIAGNOSIS — R918 Other nonspecific abnormal finding of lung field: Secondary | ICD-10-CM | POA: Diagnosis not present

## 2019-06-03 LAB — GLUCOSE, CAPILLARY: Glucose-Capillary: 102 mg/dL — ABNORMAL HIGH (ref 70–99)

## 2019-06-03 MED ORDER — FLUDEOXYGLUCOSE F - 18 (FDG) INJECTION
8.2200 | Freq: Once | INTRAVENOUS | Status: AC
  Administered 2019-06-03: 09:00:00 8.22 via INTRAVENOUS

## 2019-06-04 ENCOUNTER — Ambulatory Visit: Payer: Medicare Other | Admitting: Neurology

## 2019-06-16 NOTE — Progress Notes (Signed)
HEMATOLOGY/ONCOLOGY CONSULTATION NOTE  Date of Service: 06/17/2019  Patient Care Team: [name], MD as PCP - General (Internal Medicine) Stanford Breed [name], MD as PCP - Cardiology (Cardiology) [name], MD as Consulting Physician (Hematology) [name], RN as Oncology Nurse Navigator (Oncology) Cameron Sprang, MD as Consulting Physician (Neurology)  CHIEF COMPLAINTS/PURPOSE OF CONSULTATION:  Follicular lymphoma grade 3a  HISTORY OF PRESENTING ILLNESS:   Rachael Greer is a wonderful 79 y.o. female who has been referred to [name] by Dr. 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 She is accompanied today by a friend, and our Nurse Navigator [name]. The pt reports that she is doing well overall.   The pt reports that things began changing last September in 2018. She notes that she has taken Flonase, Claritin, and saline solution and did not have a runny nose, and did not have any breathing difficulty. She notes that her head felt as though she had a flu or a cold, without any other symptoms. She notes that she felt as if her ears were swollen and has had some minimal hearing impairment, and has myringotomy tubes. She began a steroid course about 4 months ago with Dr. [name]. She notes that she first noticed right sided neck swelling in the last month, and left sided neck swelling which began 2-3 weeks ago.   She obtained a CT Neck on 03/08/18, as noted below.   The pt notes that she has not had fevers or chills, but has woken up recently with night sweats occasionally over the last 6 months. She adds that she has noticed some SOB in the last 7-8 months which presents intermittently, and has not progressed. The pt notes that she has had some difficulty swallowing in the past, and saw Dr. [name] for concerns of a stricture, and adds that this she has had recent difficulty swallowing. The pt also notes that she has recently felt dizziness, described as the room  spinning. She has not had her dizziness worked up.  The pt notes that she had a mastectomy without radiation or chemotherapy for breast cancer in situ in 2013. She began tamoxifen preventatively afterwards for 2.5 years.   The pt notes that her interstitial cystitis cause is unknown. She notes that she took a TB vaccine 5-7 years ago, and developed severe pain and sepsis. She notes that she has not had recurrent UTIs and takes Elmiron under the care of Dr. [name] in urology. She notes that there are certain foods that she cannot eat without developing bladder pain. She also denies blood in the urine.  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 2. RIGHT lymphadenitis seen with superimposed infection or pericapsular spread of tumor.  Most recent lab results (03/08/18) of CBC w/diff and BMP is as follows: all values are WNL except for RBC at 5.22, HGB at 15.3.  On review of systems, pt reports some night sweats, some SOB, right neck swelling, left neck swelling, dizziness, hearing impairment, healed incision, right ear numbness, and denies fevers, chills, runny nose, abdominal pains, abdominal distension, blood in the urine, sinus pressure or pain, pain along the spine, leg swelling, skin rashes, and any other symptoms.   On PMHx the pt reports breast cancer in 2013, sternocleidomastoid release in childhood, interstitial cystitis,  sepsis. On Social Hx the pt denies ever smoking cigarettes and denies ever consuming alcohol  Interval History:  Rachael Greer is here today for management and evaluation of her Grade 3a Follicular Lymphoma, and after completing 3 cycles of BR. We are joined today by her daughter. The patient's last visit with [name] was on 02/11/2019. The pt  reports that she is doing well overall.  The pt reports that she does not wake up with a fullness in her ear, but notices it soon after getting up. There is no pain associated with this fullness but it does interfere with her hearing. She is not currently following up with an ENT. Pt has noticed sores on the back of her neck, near her hairline. Pt has been sleeping well and taking 1/2 tablet of Klonopin at night, which is helping her with her sleep and anxiety. She has been finding it difficult to stay active in her current home. She lives at [facility] in [place]. Pt also notes that several other residents in her home have contracted Covid-19. She has had very strong reaction to vaccines in the past, namely Typhoid. Her kidneys abscessed, she developed sepsis, and lost the lining of her bladder. Pt has not been eating well due to the poor food at the home in [place].   Of note since the patient's last visit, pt has had PET/CT ([id number]) completed on 06/03/2019 with results revealing "1. No  lymphoma recurrence on FDG PET scan.  No lymphadenopathy. 2. No evidence of breast cancer recurrence. 3. Resolution of small LEFT lung pulmonary nodule."  Lab results today (06/17/19) of CBC w/diff and CMP is as follows: all values are WNL except for Hgb at 15.1, Glucose at 100, Total Bilirubin at 2.1. 06/17/2019 LDH at 327  On review of systems, pt reports ear fullness, decreased hearing, skin sores, anxiety and denies ear pain, new back pain, abdominal pain, leg swelling and any other symptoms.   MEDICAL HISTORY:  Past Medical History:  Diagnosis Date  . Anxiety   . Breast cancer ([place])   . Chest pain   . Depression   . DOE (dyspnea on exertion)   . Dyspnea    exertion  . Eustachian tube obstruction, bilateral   . Fibromyalgia   . Gastric reflux   . History of kidney stones   . Hyperlipidemia   . Hypertension   . IBS (irritable bowel syndrome)   . Interstitial cystitis   . Nephrolithiasis   .  Nephrolithiasis   . Palpitations   . Pre-diabetes   . Sepsis ([place])     SURGICAL HISTORY: Past Surgical History:  Procedure Laterality Date  . BREAST DUCTAL SYSTEM EXCISION Right 10/30/2016   Procedure: RIGHT BREAST DUCT EXCISION;  Surgeon: Rolm Bookbinder, MD;  Location: [place];  Service: General;  Laterality: Right;  . EYE SURGERY     bilateral lense placements  . IR IMAGING GUIDED PORT INSERTION  07/12/2018  . IR REMOVAL TUN ACCESS W/ PORT W/O FL MOD SED  03/03/2019  . MASTECTOMY Left   . NECK SURGERY     muscles cut in her neck  . NEPHROSTOMY    . TONSILLECTOMY      SOCIAL HISTORY: Social History   Socioeconomic History  . Marital status: Widowed    Spouse name: Not on file  . Number of children: 4  . Years of education: Not on file  . Highest education level: Not on file  Occupational History  . Not on  file  Tobacco Use  . Smoking status: Never Smoker  . Smokeless tobacco: Never Used  Substance and Sexual Activity  . Alcohol use: No  . Drug use: No  . Sexual activity: Not Currently  Other Topics Concern  . Not on file  Social History Narrative   Right handed   One level home   Retired Corporate treasurer   Social Determinants of Health   Financial Resource Strain:   . Difficulty of Paying Living Expenses: Not on file  Food Insecurity:   . Worried About Charity fundraiser in the Last Year: Not on file  . Ran Out of Food in the Last Year: Not on file  Transportation Needs:   . Lack of Transportation (Medical): Not on file  . Lack of Transportation (Non-Medical): Not on file  Physical Activity:   . Days of Exercise per Week: Not on file  . Minutes of Exercise per Session: Not on file  Stress:   . Feeling of Stress : Not on file  Social Connections:   . Frequency of Communication with Friends and Family: Not on file  . Frequency of Social Gatherings with Friends and Family: Not on file  . Attends Religious Services: Not on file  . Active Member of Clubs or  Organizations: Not on file  . Attends Archivist Meetings: Not on file  . Marital Status: Not on file  Intimate Partner Violence:   . Fear of Current or Ex-Partner: Not on file  . Emotionally Abused: Not on file  . Physically Abused: Not on file  . Sexually Abused: Not on file    FAMILY HISTORY: Family History  Problem Relation Age of Onset  . CAD Father   . Heart attack Father   . Dementia Mother     ALLERGIES:  is allergic to amoxicillin and no known allergies.  MEDICATIONS:  Current Outpatient Medications  Medication Sig Dispense Refill  . amLODipine (NORVASC) 2.5 MG tablet Take 1 tablet (2.5 mg total) by mouth daily. 90 tablet 0  . aspirin EC 81 MG tablet Take 1 tablet (81 mg total) by mouth daily. 90 tablet 3  . atorvastatin (LIPITOR) 20 MG tablet Take 1 tablet (20 mg total) by mouth daily at 6 PM. 90 tablet 1  . citalopram (CELEXA) 10 MG tablet Take 1 tablet (10 mg total) by mouth 2 (two) times daily. 60 tablet 3  . clonazePAM (KLONOPIN) 1 MG tablet Take 1 mg by mouth 2 (two) times daily as needed for anxiety.     . famotidine (PEPCID) 20 MG tablet Take 20 mg by mouth daily.    . Ibuprofen-Diphenhydramine HCl (ADVIL PM) 200-25 MG CAPS Take 0.5-1 tablets by mouth daily as needed (sleep).     . metoprolol succinate (TOPROL-XL) 25 MG 24 hr tablet Take 25 mg by mouth every evening.    . pentosan polysulfate (ELMIRON) 100 MG capsule Take 100 mg by mouth 3 (three) times daily.     . rivastigmine (EXELON) 1.5 MG capsule Take 1 capsule (1.5 mg total) by mouth 2 (two) times daily. 60 capsule 11   No current facility-administered medications for this visit.    REVIEW OF SYSTEMS:   A 10+ POINT REVIEW OF SYSTEMS WAS OBTAINED including neurology, dermatology, psychiatry, cardiac, respiratory, lymph, extremities, GI, GU, Musculoskeletal, constitutional, breasts, reproductive, HEENT.  All pertinent positives are noted in the HPI.  All others are negative.   PHYSICAL  EXAMINATION:  Exam given in a chair  GENERAL:alert, in  no acute distress and comfortable SKIN: no acute rashes, no significant lesions, seborrheic or actinic keratosis on back EYES: conjunctiva are pink and non-injected, sclera anicteric OROPHARYNX: MMM, no exudates, no oropharyngeal erythema or ulceration NECK: supple, no JVD LYMPH:  no palpable lymphadenopathy in the cervical, axillary or inguinal regions LUNGS: clear to auscultation b/l with normal respiratory effort HEART: regular rate & rhythm ABDOMEN:  normoactive bowel sounds , non tender, not distended. No palpable hepatosplenomegaly.  Extremity: no pedal edema PSYCH: alert & oriented x 3 with fluent speech NEURO: no focal motor/sensory deficits  LABORATORY DATA:  I have reviewed the data as listed  . CBC Latest Ref Rng & Units 06/17/2019 03/03/2019 02/11/2019  WBC 4.0 - 10.5 K/uL 5.7 5.1 6.2  Hemoglobin 12.0 - 15.0 g/dL 15.1(H) 14.6 15.1(H)  Hematocrit 36.0 - 46.0 % 44.5 44.0 43.9  Platelets 150 - 400 K/uL 172 152 164   . CBC    Component Value Date/Time   WBC 5.7 06/17/2019 1158   RBC 5.06 06/17/2019 1158   HGB 15.1 (H) 06/17/2019 1158   HGB 16.3 (H) 11/02/2017 1529   HCT 44.5 06/17/2019 1158   HCT 46.7 (H) 11/02/2017 1529   PLT 172 06/17/2019 1158   PLT 192 11/02/2017 1529   MCV 87.9 06/17/2019 1158   MCV 85 11/02/2017 1529   MCH 29.8 06/17/2019 1158   MCHC 33.9 06/17/2019 1158   RDW 12.6 06/17/2019 1158   RDW 13.9 11/02/2017 1529   LYMPHSABS 1.2 06/17/2019 1158   LYMPHSABS 2.1 11/02/2017 1529   MONOABS 0.5 06/17/2019 1158   EOSABS 0.1 06/17/2019 1158   EOSABS 0.1 11/02/2017 1529   BASOSABS 0.1 06/17/2019 1158   BASOSABS 0.1 11/02/2017 1529     . CMP Latest Ref Rng & Units 06/17/2019 02/11/2019 10/23/2018  Glucose 70 - 99 mg/dL 100(H) 107(H) 106(H)  BUN 8 - 23 mg/dL 19 19 19   Creatinine 0.44 - 1.00 mg/dL 0.86 0.98 0.91  Sodium 135 - 145 mmol/L 141 138 142  Potassium 3.5 - 5.1 mmol/L 4.0 4.3 4.0  Chloride  98 - 111 mmol/L 108 109 108  CO2 22 - 32 mmol/L 23 23 26   Calcium 8.9 - 10.3 mg/dL 10.3 10.5(H) 9.8  Total Protein 6.5 - 8.1 g/dL 7.0 6.8 6.8  Total Bilirubin 0.3 - 1.2 mg/dL 2.1(H) 1.6(H) 1.5(H)  Alkaline Phos 38 - 126 U/L 91 79 86  AST 15 - 41 U/L 35 24 21  ALT 0 - 44 U/L 36 28 31    03/22/18 Flow Cytometry:   03/22/18 Biopsy:     RADIOGRAPHIC STUDIES: I have personally reviewed the radiological images as listed and agreed with the findings in the report. NM PET Image Restag (PS) Skull Base To Thigh  Result Date: 06/03/2019 CLINICAL DATA:  Subsequent treatment strategy for lymphoma and lung nodule. Additional history of breast cancer. EXAM: NUCLEAR MEDICINE PET SKULL BASE TO THIGH TECHNIQUE: 8.2 mCi F-18 FDG was injected intravenously. Full-ring PET imaging was performed from the skull base to thigh after the radiotracer. CT data was obtained and used for attenuation correction and anatomic localization. Fasting blood glucose: 102 mg/dl COMPARISON:  PET-CT scan 08/22/2018 FINDINGS: Mediastinal blood pool activity: SUV max 2.87 Liver activity: SUV max NA NECK: No hypermetabolic lymph nodes in the neck. Incidental CT findings: none CHEST: No hypermetabolic mediastinal or hilar nodes. No suspicious pulmonary nodules on the CT scan. Interval resolution of the subpleural nodule described on comparison CT. New nodularity. Incidental CT findings: none ABDOMEN/PELVIS:  No abnormal hypermetabolic activity within the liver, pancreas, adrenal glands, or spleen. No hypermetabolic lymph nodes in the abdomen or pelvis. Incidental CT findings: Low-density lesion in the spleen is unchanged. No metabolic activity most consistent with benign cysts. SKELETON: No focal hypermetabolic activity to suggest skeletal metastasis. Incidental CT findings: none IMPRESSION: 1. No  lymphoma recurrence on FDG PET scan.  No lymphadenopathy. 2. No evidence of breast cancer recurrence. 3. Resolution of small LEFT lung  pulmonary nodule . Electronically Signed   By: Suzy Bouchard M.D.   On: 06/03/2019 13:14    ASSESSMENT & PLAN:   79 y.o. female with  1. 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 2. RIGHT lymphadenitis seen with superimposed infection or pericapsular spread of tumor.    03/22/18 biopsy revealed follicular lymphoma, Grade 3a   04/04/18 Hep B and Hep C negative   04/17/18 PET/CT revealed Notable bilateral adenopathy in the neck and posterior nasopharynx is hypermetabolic and mostly Deauville 5 level activity, compatible with malignancy. 2. No pathologically enlarged or significantly hypermetabolic adenopathy in the chest, or abdomen/pelvis. No splenomegaly. Slight enlargement of the photopenic splenic cyst noted on remote prior Exams. 3. Other imaging findings of potential clinical significance: Aortic Atherosclerosis. Coronary atherosclerosis. Diffuse hepatic steatosis. Nonobstructive left nephrolithiasis.   S/p C1 with Rituxan alone, then 3 cycles of combined BR completed by 08/02/18. Reduced Bendamustine to 70mg /m2 for interstitial cystitis. Chose not to treat with R-CHOP given interstitial cystitis.  08/22/18 PET/CT revealed Interval resolution of bilateral hypermetabolic cervical and posterior nasopharyngeal adenopathy. No evidence for residual or recurrent metabolically active tumor. 2. Aortic Atherosclerosis. Lad coronary artery calcifications. 3. New nonspecific 5 mm subpleural nodule is noted within the posteromedial left upper lobe.   10/30/18 CT Maxillofacial which revealed "No recurrent mass in the face or pharynx. Mastoid sinus clear bilaterally. Middle ear clear bilaterally. Paranasal sinuses clear."  10/30/18 CT Neck which revealed "Interval resolution of nasopharyngeal mass and cervical  adenopathy bilaterally. No residual mass or adenopathy identified in the neck."  Discussed again the indications to consider completing maintenance Rituxan every 2 months, however considering her concerns with interstitial cystitis, concerns for memory and other medical priorities, do not feel it is unreasonable to hold off on maintenance Rituxan. Pt agrees with this and would like to pursue watchful observation.   2. Interstitial cystitis - stable. -Continue follow up with Urology Dr. Alona Bene for interstitial cystitis.  PLAN:  -Discussed pt labwork today, 06/17/19;  all values are WNL except for Hgb at 15.1, Glucose at 100, Total Bilirubin at 2.1. -Discussed 06/17/2019 LDH at 327 -Discussed 06/03/2019 PET/CT ([id number]) which revealed "1. No  lymphoma recurrence on FDG PET scan.  No lymphadenopathy. 2. No evidence of breast cancer recurrence. 3. Resolution of small LEFT lung pulmonary nodule." -The pt shows no clinical or lab progression/return of her Follicular lymphoma at this time.  -Patient is not a good candidate for maintenance Rituxan -Advised pt that it would not be unreasonable for her to continue to receive mammograms, but lack of Hx or FHx of breast cancer does not make this mandatory -Advised pt to f/u with Dermatologist to monitor potential seborrheic/actinic keratosis -Will continue to monitor with labs and clinical visits -Will see back in 6 months with labs   FOLLOW UP: RTC with Dr Irene Limbo with labs in 6 months   The total time spent in the  appt was 30 minutes and more than 50% was on counseling and direct patient cares.  All of the patient's questions were answered with apparent satisfaction. The patient knows to call the clinic with any problems, questions or concerns.    Sullivan Lone MD [place] AAHIVMS [facility] Hematology/Oncology Physician [facility]  (Office):       [phone] (Work cell):  [phone] (Fax):           [phone]  06/17/2019  1:38 PM  I, Yevette Edwards, am acting as a scribe for Dr. Sullivan Lone.   .I have reviewed the above documentation for accuracy and completeness, and I agree with the above. [name] MD

## 2019-06-17 ENCOUNTER — Inpatient Hospital Stay: Payer: Medicare Other

## 2019-06-17 ENCOUNTER — Encounter: Payer: Self-pay | Admitting: Hematology

## 2019-06-17 ENCOUNTER — Other Ambulatory Visit: Payer: Self-pay

## 2019-06-17 ENCOUNTER — Inpatient Hospital Stay: Payer: Medicare Other | Attending: Hematology | Admitting: Hematology

## 2019-06-17 VITALS — BP 115/61 | HR 64 | Temp 98.0°F | Resp 17 | Wt 165.0 lb

## 2019-06-17 DIAGNOSIS — Z7982 Long term (current) use of aspirin: Secondary | ICD-10-CM | POA: Insufficient documentation

## 2019-06-17 DIAGNOSIS — F419 Anxiety disorder, unspecified: Secondary | ICD-10-CM | POA: Insufficient documentation

## 2019-06-17 DIAGNOSIS — E785 Hyperlipidemia, unspecified: Secondary | ICD-10-CM | POA: Diagnosis not present

## 2019-06-17 DIAGNOSIS — H9190 Unspecified hearing loss, unspecified ear: Secondary | ICD-10-CM | POA: Insufficient documentation

## 2019-06-17 DIAGNOSIS — K219 Gastro-esophageal reflux disease without esophagitis: Secondary | ICD-10-CM | POA: Diagnosis not present

## 2019-06-17 DIAGNOSIS — I10 Essential (primary) hypertension: Secondary | ICD-10-CM | POA: Diagnosis not present

## 2019-06-17 DIAGNOSIS — Z79899 Other long term (current) drug therapy: Secondary | ICD-10-CM | POA: Insufficient documentation

## 2019-06-17 DIAGNOSIS — Z9010 Acquired absence of unspecified breast and nipple: Secondary | ICD-10-CM | POA: Diagnosis not present

## 2019-06-17 DIAGNOSIS — I2510 Atherosclerotic heart disease of native coronary artery without angina pectoris: Secondary | ICD-10-CM | POA: Insufficient documentation

## 2019-06-17 DIAGNOSIS — C8238 Follicular lymphoma grade IIIa, lymph nodes of multiple sites: Secondary | ICD-10-CM | POA: Insufficient documentation

## 2019-06-17 DIAGNOSIS — R911 Solitary pulmonary nodule: Secondary | ICD-10-CM

## 2019-06-17 DIAGNOSIS — Z9223 Personal history of estrogen therapy: Secondary | ICD-10-CM | POA: Diagnosis not present

## 2019-06-17 DIAGNOSIS — Z853 Personal history of malignant neoplasm of breast: Secondary | ICD-10-CM | POA: Diagnosis not present

## 2019-06-17 DIAGNOSIS — N3010 Interstitial cystitis (chronic) without hematuria: Secondary | ICD-10-CM | POA: Diagnosis not present

## 2019-06-17 LAB — CMP (CANCER CENTER ONLY)
ALT: 36 U/L (ref 0–44)
AST: 35 U/L (ref 15–41)
Albumin: 4.1 g/dL (ref 3.5–5.0)
Alkaline Phosphatase: 91 U/L (ref 38–126)
Anion gap: 10 (ref 5–15)
BUN: 19 mg/dL (ref 8–23)
CO2: 23 mmol/L (ref 22–32)
Calcium: 10.3 mg/dL (ref 8.9–10.3)
Chloride: 108 mmol/L (ref 98–111)
Creatinine: 0.86 mg/dL (ref 0.44–1.00)
GFR, Est AFR Am: >60 mL/min (ref >60)
GFR, Estimated: >60 mL/min (ref >60)
Glucose, Bld: 100 mg/dL — ABNORMAL HIGH (ref 70–99)
Potassium: 4 mmol/L (ref 3.5–5.1)
Sodium: 141 mmol/L (ref 135–145)
Total Bilirubin: 2.1 mg/dL — ABNORMAL HIGH (ref 0.3–1.2)
Total Protein: 7 g/dL (ref 6.5–8.1)

## 2019-06-17 LAB — CBC WITH DIFFERENTIAL/PLATELET
Abs Immature Granulocytes: 0.02 10*3/uL (ref 0.00–0.07)
Basophils Absolute: 0.1 10*3/uL (ref 0.0–0.1)
Basophils Relative: 1 %
Eosinophils Absolute: 0.1 10*3/uL (ref 0.0–0.5)
Eosinophils Relative: 2 %
HCT: 44.5 % (ref 36.0–46.0)
Hemoglobin: 15.1 g/dL — ABNORMAL HIGH (ref 12.0–15.0)
Immature Granulocytes: 0 %
Lymphocytes Relative: 21 %
Lymphs Abs: 1.2 10*3/uL (ref 0.7–4.0)
MCH: 29.8 pg (ref 26.0–34.0)
MCHC: 33.9 g/dL (ref 30.0–36.0)
MCV: 87.9 fL (ref 80.0–100.0)
Monocytes Absolute: 0.5 10*3/uL (ref 0.1–1.0)
Monocytes Relative: 8 %
Neutro Abs: 3.8 10*3/uL (ref 1.7–7.7)
Neutrophils Relative %: 68 %
Platelets: 172 10*3/uL (ref 150–400)
RBC: 5.06 MIL/uL (ref 3.87–5.11)
RDW: 12.6 % (ref 11.5–15.5)
WBC: 5.7 10*3/uL (ref 4.0–10.5)
nRBC: 0 % (ref 0.0–0.2)

## 2019-06-17 LAB — LACTATE DEHYDROGENASE: LDH: 327 U/L — ABNORMAL HIGH (ref 98–192)

## 2019-06-18 ENCOUNTER — Telehealth: Payer: Self-pay | Admitting: Hematology

## 2019-06-18 NOTE — Telephone Encounter (Signed)
Scheduled appt per 1/5 los, sent a message to HIM pool to get a calendar mailed out. 

## 2019-07-05 ENCOUNTER — Other Ambulatory Visit: Payer: Self-pay | Admitting: Nurse Practitioner

## 2019-07-07 ENCOUNTER — Encounter: Payer: Medicare Other | Admitting: Psychology

## 2019-07-17 ENCOUNTER — Telehealth: Payer: Self-pay | Admitting: *Deleted

## 2019-07-17 NOTE — Telephone Encounter (Signed)
Unable to leave a message line busy.

## 2019-07-18 ENCOUNTER — Other Ambulatory Visit: Payer: Self-pay | Admitting: Physician Assistant

## 2019-07-18 DIAGNOSIS — C4491 Basal cell carcinoma of skin, unspecified: Secondary | ICD-10-CM

## 2019-07-18 HISTORY — DX: Basal cell carcinoma of skin, unspecified: C44.91

## 2019-07-28 ENCOUNTER — Encounter: Payer: Medicare Other | Admitting: Psychology

## 2019-08-11 NOTE — Progress Notes (Signed)
Virtual Visit via Telephone Note   This visit type was conducted due to national recommendations for restrictions regarding Rachael COVID-19 Pandemic (e.g. social distancing) in an effort to limit this patient's exposure and mitigate transmission in our community.  Due to her co-morbid illnesses, this patient is at least at moderate risk for complications without adequate follow up.  This format is felt to be most appropriate for this patient at this time.  Rachael patient did not have access to video technology/had technical difficulties with video requiring transitioning to audio format only (telephone).  All issues noted in this document were discussed and addressed.  No physical exam could be performed with this format.  Please refer to Rachael patient's chart for her  consent to telehealth for [facility].  Evaluation Performed:  Follow-up visit  This visit type was conducted due to national recommendations for restrictions regarding Rachael COVID-19 Pandemic (e.g. social distancing).  This format is felt to be most appropriate for this patient at this time.  All issues noted in this document were discussed and addressed.  No physical exam was performed (except for noted visual exam findings with Video Visits).  Please refer to Rachael patient's chart (MyChart message for video visits and phone note for telephone visits) for Rachael patient's consent to telehealth for Rachael Greer  Date:  08/12/2019   ID:  Rachael Greer, DOB Nov 30, 1940, MRN [id number]  Patient Location:  [address] [place] [address]   Provider location:   Rachael Greer [address] Office [phone] Fax [phone]  PCP:  Rachael Gravel, MD  Cardiologist:  Rachael Ruths, MD  Electrophysiologist:  None   Chief Complaint: Follow-up  History of Present Illness:    Rachael Greer is a 79 y.o. female who presents via audio/video conferencing for a telehealth visit today.  Patient  verified DOB and address.  Rachael patient does not symptoms concerning for COVID-19 infection (fever, chills, cough, or new SHORTNESS OF BREATH).   She has a PMH of chronic diastolic CHF, demand ischemia, essential hypertension, acute respiratory failure with hypoxemia, acute on chronic renal failure, palpitations, and anemia.  She wore a Holter monitor 4/15 which showed sinus rhythm with PACs, PVCs, and brief PAT.  A stress echocardiogram 11/15 was normal.  Her echocardiogram 1/18 showed normal LV function and trace mitral regurgitation.  She underwent a PET scan for lymphoma and atherosclerosis noted in Rachael aorta and coronaries.  She was seen by Dr. Stanford Greer 02/05/2019.  During that time she was doing well.  She denied dyspnea, chest discomfort, palpitations, and syncope.  She indicated that she did have occasional dizziness that improved with Klonopin.  She was concerned about her blood pressure being lower which may have been contributing as well.  She is seen virtually today and states she has been doing fairly well.  She has been trying to decrease Rachael amount of Klonopin that she is taking.  She states that she has been having a hard time decreasing Rachael medication.  She states she has been gaining weight and has been less active with Rachael COVID-19 pandemic.  She has been reluctant to get Rachael vaccine due to concerns with that being rolled out too fast.  She also states that she has been having shortness of breath with normal activities.  This is not a new occurrence.  Her pulmonary function tests from 7/19 were normal.  She states that she recently was able to  complete a stationary bike trial and better her previous best.  I have encouraged her to increase her physical activity as tolerated and follow a heart healthy low-sodium diet.  She was interested in Rachael Rachael Greer COVID-19 vaccination.  I have instructed her to follow-up with her PCP regarding a reduction in her Klonopin and further questions  with Rachael Rachael Greer vaccination.  Today she denies chest pain, increased shortness of breath, lower extremity edema, fatigue, palpitations, melena, hematuria, hemoptysis, diaphoresis, weakness, presyncope, syncope, orthopnea, and PND.   Prior CV studies:   Rachael following studies were reviewed today:  EKG 02/05/2019 Normal sinus rhythm 60 bpm  Echocardiogram 05/18/2012 Study Conclusions   - Left ventricle: Rachael cavity size was normal. Systolic  function was normal. Rachael estimated ejection fraction was  in Rachael range of 60% to 65%. Wall motion was normal; there  were no regional wall motion abnormalities. Doppler  parameters are consistent with abnormal left ventricular  relaxation (grade 1 diastolic dysfunction). Rachael E/e' ratio  is <10, suggesting normal LV filling pressure.  - Aortic valve: Sclerosis without stenosis. No  regurgitation.  - Mitral valve: Mildly sclerotic leaflets . Trivial  regurgitation.  - Left atrium: Rachael atrium was normal in size.  - Tricuspid valve: Poorly visualized. Trivial regurgitation.  - Pulmonary arteries: PA peak pressure: 56mm Hg (S).  - Systemic veins: Rachael IVC measures <2.1 cm but does not  collapse more than 50%, suggesting elevated RA pressure of  10 mmHg - ?positive pressure ventilation.    Holter monitor 09/04/2013 Sinus rhythm with PACs.  No atrial fibrillation noted.  Past Medical History:  Diagnosis Date  . Anxiety   . Breast cancer ([place])   . Chest pain   . Depression   . DOE (dyspnea on exertion)   . Dyspnea    exertion  . Eustachian tube obstruction, bilateral   . Fibromyalgia   . Gastric reflux   . History of kidney stones   . Hyperlipidemia   . Hypertension   . IBS (irritable bowel syndrome)   . Interstitial cystitis   . Nephrolithiasis   . Nephrolithiasis   . Palpitations   . Pre-diabetes   . Sepsis Rachael Greer [facility])    Past Surgical History:  Procedure Laterality Date  . BREAST DUCTAL SYSTEM EXCISION  Right 10/30/2016   Procedure: RIGHT BREAST DUCT EXCISION;  Surgeon: Rolm Bookbinder, MD;  Location: [place];  Service: General;  Laterality: Right;  . EYE SURGERY     bilateral lense placements  . IR IMAGING GUIDED PORT INSERTION  07/12/2018  . IR REMOVAL TUN ACCESS W/ PORT W/O FL MOD SED  03/03/2019  . MASTECTOMY Left   . NECK SURGERY     muscles cut in her neck  . NEPHROSTOMY    . TONSILLECTOMY       Current Meds  Medication Sig  . amLODipine (NORVASC) 2.5 MG tablet Take 1 tablet (2.5 mg total) by mouth daily.  Marland Kitchen aspirin EC 81 MG tablet Take 1 tablet (81 mg total) by mouth daily.  Marland Kitchen atorvastatin (LIPITOR) 20 MG tablet Take 1 tablet (20 mg total) by mouth daily at 6 PM.  . clonazePAM (KLONOPIN) 1 MG tablet Take 1 mg by mouth 2 (two) times daily as needed for anxiety.   . famotidine (PEPCID) 20 MG tablet Take 20 mg by mouth daily as needed.   . Ibuprofen-Diphenhydramine HCl (ADVIL PM) 200-25 MG CAPS Take 0.5-1 tablets by mouth daily as needed (sleep).   Marland Kitchen  metoprolol succinate (TOPROL-XL) 25 MG 24 hr tablet Take 25 mg by mouth every evening.  . pentosan polysulfate (ELMIRON) 100 MG capsule Take 100 mg by mouth 3 (three) times daily.      Allergies:   Amoxicillin and No known allergies   Social History   Tobacco Use  . Smoking status: Never Smoker  . Smokeless tobacco: Never Used  Substance Use Topics  . Alcohol use: No  . Drug use: No     Family Hx: Rachael patient's family history includes CAD in her father; Dementia in her mother; Heart attack in her father.  ROS:   Please see Rachael history of present illness.     All other systems reviewed and are negative.   Labs/Other Tests and Data Reviewed:    Recent Labs: 11/29/2018: TSH 1.67 06/17/2019: ALT 36; BUN 19; Creatinine 0.86; Hemoglobin 15.1; Platelets 172; Potassium 4.0; Sodium 141   Recent Lipid Panel No results found for: CHOL, TRIG, HDL, CHOLHDL, LDLCALC, LDLDIRECT  Wt Readings from Last 3 Encounters:  08/12/19 167  lb (75.8 kg)  06/17/19 165 lb (74.8 kg)  04/29/19 165 lb (74.8 kg)     Exam:    Vital Signs:  BP 120/68 (BP Location: Left Arm, Patient Position: Sitting)   Pulse 69   Wt 167 lb (75.8 kg)   BMI 29.58 kg/m    Well nourished, well developed female in no  acute distress.   ASSESSMENT & PLAN:    1.  Coronary artery calcification/chest pain -no chest pain today. Continue aspirin 81 mg tablet daily Continue atorvastatin 20 mg daily Heart healthy low-sodium diet Increase physical activity as tolerated  Shortness of breath-remains unchanged.  Has been more sedentary with COVID-19.  Pulmonary function test 12/12/2017 was normal. Increase physical activity as tolerated Weight loss  Essential hypertension-BP today 120/68.  Well-controlled at home.  During her last visit she was concerned about dizziness.  Discontinued blood pressure medication was discussed however, she was hesitant about medication changes at that time. Continue amlodipine 2.5 mg daily Continue metoprolol succinate 25 mg daily Heart healthy low-sodium diet-salty 6 given Increase physical activity as tolerated  Hyperlipidemia-LDL 90 02/04/2019 Continue atorvastatin 20 mg daily Heart healthy low-sodium high-fiber diet Increase physical activity as tolerated  Palpitations-heart rate today 69.  Palpitations most less frequent with beta-blocker. Continue metoprolol succinate 25 mg daily Avoid triggers caffeine, chocolate, EtOH etc. 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 Managed by oncology  Follow-up with Dr. Stanford Greer in 6 months.  COVID-19 Education: Rachael signs and symptoms of COVID-19 were discussed with Rachael patient and how to seek care for testing (follow up with PCP or arrange E-visit).  Rachael importance of social distancing was discussed today.  Patient Risk:   After full review of this patients clinical status, I feel that they are at  least moderate risk at this time.  Time:   Today, I have spent 27 minutes minutes with Rachael patient with telehealth technology discussing COVID-19, blood pressure, heart rate, exercise, and medication..     Medication Adjustments/Labs and Tests Ordered: Current medicines are reviewed at length with Rachael patient today.  Concerns regarding medicines are outlined above.   Tests Ordered: No orders of Rachael defined types were placed in this encounter.  Medication Changes: No orders of Rachael defined types were placed in this encounter.    Disposition:  in 6 month(s)  Signed,  Jossie Ng. Trygve Thal NP-C  [place] [address] Office [phone] Fax [phone]

## 2019-08-12 ENCOUNTER — Telehealth (INDEPENDENT_AMBULATORY_CARE_PROVIDER_SITE_OTHER): Payer: Medicare Other | Admitting: General Practice

## 2019-08-12 VITALS — BP 120/68 | HR 69 | Wt 167.0 lb

## 2019-08-12 DIAGNOSIS — I2510 Atherosclerotic heart disease of native coronary artery without angina pectoris: Secondary | ICD-10-CM | POA: Diagnosis not present

## 2019-08-12 DIAGNOSIS — R002 Palpitations: Secondary | ICD-10-CM

## 2019-08-12 DIAGNOSIS — R079 Chest pain, unspecified: Secondary | ICD-10-CM

## 2019-08-12 DIAGNOSIS — I10 Essential (primary) hypertension: Secondary | ICD-10-CM | POA: Diagnosis not present

## 2019-08-12 DIAGNOSIS — R0602 Shortness of breath: Secondary | ICD-10-CM | POA: Diagnosis not present

## 2019-08-12 DIAGNOSIS — Z8579 Personal history of other malignant neoplasms of lymphoid, hematopoietic and related tissues: Secondary | ICD-10-CM

## 2019-08-12 DIAGNOSIS — E7800 Pure hypercholesterolemia, unspecified: Secondary | ICD-10-CM

## 2019-08-12 DIAGNOSIS — Z8572 Personal history of non-Hodgkin lymphomas: Secondary | ICD-10-CM

## 2019-08-12 DIAGNOSIS — E785 Hyperlipidemia, unspecified: Secondary | ICD-10-CM

## 2019-08-12 NOTE — Patient Instructions (Signed)
Special Instructions: PLEASE INCREASE PHYSICAL ACTIVITY 15 MINUTES DAILY OF EASY/MODERATE ACTIVITY AS TOLERATED  PLEASE READ AND FOLLOW SALTY 6 ATTACHED  Follow-Up: 6 months Please call our office 2 months in advance, JUL 2021 to schedule this SEPT 2021 appointment. In Person Kirk Ruths, MD.    At [facility], you and your health needs are our priority.  As part of our continuing mission to provide you with exceptional heart care, we have created designated Provider Care Teams.  These Care Teams include your primary Cardiologist (physician) and Advanced Practice Providers (APPs -  Physician Assistants and Nurse Practitioners) who all work together to provide you with the care you need, when you need it.  Reduce your risk of getting COVID-19 With your heart disease it is especially important for people at increased risk of severe illness from COVID-19, and those who live with them, to protect themselves from getting COVID-19. The best way to protect yourself and to help reduce the spread of the virus that causes COVID-19 is to: Marland Kitchen Limit your interactions with other people as much as possible. . Take COVID-19 when you do interact with others. If you start feeling sick and think you may have COVID-19, get in touch with your healthcare provider within 24 hours.  Thank you for choosing [facility]!!

## 2019-08-18 ENCOUNTER — Other Ambulatory Visit: Payer: Self-pay | Admitting: *Deleted

## 2019-08-18 ENCOUNTER — Telehealth: Payer: Self-pay | Admitting: Cardiology

## 2019-08-18 MED ORDER — AMLODIPINE BESYLATE 2.5 MG PO TABS: 2.5000 mg | ORAL_TABLET | Freq: Every day | ORAL | 0 refills | Status: DC

## 2019-08-18 NOTE — Telephone Encounter (Signed)
amLODipine (NORVASC) 2.5 MG tablet  TAKE 1 TABLET (2.5 MG) BY MOUTH DAILY

## 2019-08-18 NOTE — Telephone Encounter (Signed)
Refill sent.

## 2019-08-22 ENCOUNTER — Ambulatory Visit: Payer: Medicare Other | Admitting: Psychology

## 2019-08-22 ENCOUNTER — Encounter: Payer: Self-pay | Admitting: Psychology

## 2019-08-22 ENCOUNTER — Other Ambulatory Visit: Payer: Self-pay

## 2019-08-22 DIAGNOSIS — M797 Fibromyalgia: Secondary | ICD-10-CM | POA: Diagnosis not present

## 2019-08-22 DIAGNOSIS — F330 Major depressive disorder, recurrent, mild: Secondary | ICD-10-CM | POA: Diagnosis not present

## 2019-08-22 DIAGNOSIS — F411 Generalized anxiety disorder: Secondary | ICD-10-CM

## 2019-08-22 DIAGNOSIS — H6983 Other specified disorders of Eustachian tube, bilateral: Secondary | ICD-10-CM | POA: Diagnosis not present

## 2019-08-22 DIAGNOSIS — R4189 Other symptoms and signs involving cognitive functions and awareness: Secondary | ICD-10-CM

## 2019-08-22 NOTE — Progress Notes (Signed)
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 2 (10) 50 Average      Total Repetition Errors 4 (9) 37 Average       D-KEFS 20 Questions Test: Scaled Score Percentile     Total Weighted Achievement Score Discontinued (can't understand directions) --- ---    Initial Abstraction Score --- --- ---       Language:          Verbal Fluency Test: Raw Score (T Score) Percentile     Phonemic Fluency (FAS) 44 (51) 54 Average    Animal Fluency 21 (55) 69 Average        NAB Language Module, Form 1: T Score Percentile     Auditory Comprehension 57 75 Above Average    Naming 28/31 (40) 16 Below Average       Visuospatial/Visuoconstruction:      Raw Score Percentile   Clock Drawing: 10/10 --- Within Normal Limits       NAB Spatial Module, Form 1: T Score Percentile     Figure Drawing Copy 40 16 Below Average        Scaled Score Percentile   WAIS-IV Matrix Reasoning: 12 75 Above Average       Mood and Personality:      Raw Score Percentile   Geriatric Depression Scale: 16 --- Mild  Geriatric Anxiety Scale: 13 --- Mild    Somatic 3 --- Minimal    Cognitive 6 --- Mild    Affective 4 --- Mild       Additional Questionnaires:  Raw Score Percentile   PROMIS Sleep Disturbance Questionnaire: 20 --- None to Slight    Informed Consent and Coding/Compliance:   Ms. Shiraishi was provided with a verbal description of the nature and purpose of the present neuropsychological evaluation. Also reviewed were the foreseeable risks and/or discomforts and benefits of the procedure, limits of confidentiality, and mandatory reporting requirements of this provider. The patient was given the opportunity to ask questions and receive answers about the evaluation. Oral consent to participate was provided by the patient.   This evaluation was conducted by Christia Reading, Ph.D., licensed clinical neuropsychologist. Ms. Jarosinski completed a comprehensive clinical interview with Dr. Melvyn Novas, billed as one unit [phone], and 125 minutes of cognitive testing and scoring, billed as one unit [phone] and three additional units [id number]. Psychometrist Milana Kidney, B.S., assisted Dr. Melvyn Novas with test administration and scoring procedures. As a separate and discrete service, Dr. Melvyn Novas spent a total of 180 minutes in interpretation and report writing billed as one unit [phone] and two units 96133.

## 2019-08-22 NOTE — Progress Notes (Signed)
   Psychometrician Note   Cognitive testing was administered to Rachael Greer by Rachael Greer, B.S. (psychometrist) under the supervision of Dr. Christia Reading, Ph.D., licensed psychologist. Ms. Rachael Greer did not appear overtly distressed by the testing session per behavioral observation or responses across self-report questionnaires. Dr. Christia Reading, Ph.D. checked in with Rachael Greer as needed to manage any distress related to testing procedures (if applicable). Rest breaks were offered.    The battery of tests administered was selected by Dr. Christia Reading, Ph.D. with consideration to Rachael Greer's current level of functioning, the nature of her symptoms, emotional and behavioral responses during interview, level of literacy, observed level of motivation/effort, and the nature of the referral question. This battery was communicated to the psychometrist. Communication between Dr. Christia Reading, Ph.D. and the psychometrist was ongoing throughout the evaluation and Dr. Christia Reading, Ph.D. was immediately accessible at all times. Dr. Christia Reading, Ph.D. provided supervision to the psychometrist on the date of this service to the extent necessary to assure the quality of all services provided.    Rachael Greer will return within approximately 1-2 weeks for an interactive feedback session with Dr. Melvyn Novas at which time her test performances, clinical impressions, and treatment recommendations will be reviewed in detail. Rachael Greer understands she can contact our office should she require our assistance before this time.  A total of 125 minutes of billable time were spent face-to-face with Rachael Greer by the psychometrist. This includes both test administration and scoring time. Billing for these services is reflected in the clinical report generated by Dr. Christia Reading, Ph.D..  This note reflects time spent with the psychometrician and does not include test scores or any clinical interpretations made by  Dr. Melvyn Novas. The full report will follow in a separate note.

## 2019-09-03 ENCOUNTER — Ambulatory Visit (INDEPENDENT_AMBULATORY_CARE_PROVIDER_SITE_OTHER): Payer: Medicare Other | Admitting: Psychology

## 2019-09-03 ENCOUNTER — Other Ambulatory Visit: Payer: Self-pay

## 2019-09-03 DIAGNOSIS — F330 Major depressive disorder, recurrent, mild: Secondary | ICD-10-CM

## 2019-09-03 DIAGNOSIS — M797 Fibromyalgia: Secondary | ICD-10-CM | POA: Diagnosis not present

## 2019-09-03 DIAGNOSIS — F411 Generalized anxiety disorder: Secondary | ICD-10-CM | POA: Diagnosis not present

## 2019-09-03 NOTE — Progress Notes (Signed)
   Neuropsychology Feedback Session Tillie Rung. [facility]  Reason for Referral:   Rachael Greer a 79 y.o. right-handed Caucasian female referred by Ellouise Newer, M.D.,to characterize hercurrent cognitive functioning and assist with diagnostic clarity and treatment planning in the context of subjective cognitive decline and concern for a neurodegenerative illness.  Feedback:   Rachael Greer completed a comprehensive neuropsychological evaluation on 08/22/2019. Please refer to that encounter for the full report and recommendations. Briefly, results suggested neuropsychological functioning generally within normal limits. Relative weaknesses (i.e., below average range) were exhibited across somewhat isolated tasks assessing visual encoding (i.e., learning) of new information, complex attention, confrontation naming, and her copy of a complex figure (caused primarily by a rapid and sloppy approach). Despite this, below average scores still fall within appropriate normative ranges for Rachael Greer given premorbid intellectual estimates and no scores across the entirety of the evaluation fell below the 16th percentile. Specific to memory, despite Rachael Greer exhibiting a relative weakness learning visual information, she learned verbal information efficiently and retained both verbal and visual information after lengthy delays. Overall, memory performance combined with intact performances across other areas of cognitive functioning is not suggestive of Alzheimer's disease at the present time. It is possible that mild levels of acute anxiety and depression negatively influence Rachael Greer's day-to-day functioning. This, in addition to relative weaknesses across more complex attention could create instances where she is unable to attend to important information well enough and/or get more easily confused or disoriented, thus impacting her ability to learn this information and  later recall it on demand. Ongoing hearing difficulties could also contribute to this conceptualization.   Rachael Greer was accompanied by [name] (her durable POA) during the current telephone appointment. Content of the current session focused on the results of her neuropsychological evaluation. Rachael Greer and [name] were given the opportunity to ask questions and their questions were answered. They were encouraged to reach out should additional questions arise. A copy of her report was mailed at the conclusion of the visit.      19 minutes were spent conducting the current feedback session with Rachael Greer, billed as one unit [id number].

## 2019-11-13 ENCOUNTER — Other Ambulatory Visit: Payer: Self-pay

## 2019-11-13 MED ORDER — AMLODIPINE BESYLATE 2.5 MG PO TABS: 2.5000 mg | ORAL_TABLET | Freq: Every day | ORAL | 2 refills | Status: DC

## 2019-11-26 ENCOUNTER — Encounter: Payer: Self-pay | Admitting: *Deleted

## 2019-11-28 ENCOUNTER — Encounter: Payer: Self-pay | Admitting: Physician Assistant

## 2019-11-28 ENCOUNTER — Other Ambulatory Visit: Payer: Self-pay

## 2019-11-28 ENCOUNTER — Ambulatory Visit: Payer: Medicare Other | Admitting: Physician Assistant

## 2019-11-28 DIAGNOSIS — L578 Other skin changes due to chronic exposure to nonionizing radiation: Secondary | ICD-10-CM

## 2019-11-28 DIAGNOSIS — D229 Melanocytic nevi, unspecified: Secondary | ICD-10-CM | POA: Diagnosis not present

## 2019-11-28 DIAGNOSIS — L820 Inflamed seborrheic keratosis: Secondary | ICD-10-CM

## 2019-11-28 DIAGNOSIS — Z1283 Encounter for screening for malignant neoplasm of skin: Secondary | ICD-10-CM | POA: Diagnosis not present

## 2019-11-28 DIAGNOSIS — Z85828 Personal history of other malignant neoplasm of skin: Secondary | ICD-10-CM | POA: Diagnosis not present

## 2019-11-28 DIAGNOSIS — D1801 Hemangioma of skin and subcutaneous tissue: Secondary | ICD-10-CM

## 2019-11-28 DIAGNOSIS — L821 Other seborrheic keratosis: Secondary | ICD-10-CM

## 2019-11-28 DIAGNOSIS — L814 Other melanin hyperpigmentation: Secondary | ICD-10-CM

## 2019-11-28 NOTE — Progress Notes (Signed)
   Follow-Up Visit   Subjective  Rachael Greer is a 78 y.o. female who presents for the following: Skin Problem (Check a few spots on patients back, previous BCC that was treated. Also check patients left cheek. Left side scalp ).   The following portions of the chart were reviewed this encounter and updated as appropriate: Tobacco  Allergies  Meds  Problems  Med Hx  Surg Hx  Fam Hx      Objective  Well appearing patient in no apparent distress; mood and affect are within normal limits.  All skin waist up examined.  Objective  waist up: All scars clear. No signs of DN or NMSC  Objective  Left Breast, Mid Back: COMPLETELY CLEAR  Objective  Left Forearm - Posterior (5), Left Hand - Posterior (6), Left Malar Cheek (7): Erythematous stuck-on, waxy papule or plaque.   Assessment & Plan  Screening exam for skin cancer waist up  Yearly skin checks  History of basal cell carcinoma (BCC) (2) Left Breast; Mid Back  observe  Inflamed seborrheic keratosis (18) Left Hand - Posterior (6); Left Forearm - Posterior (5); Left Malar Cheek (7)  Destruction of lesion - Left Forearm - Posterior, Left Hand - Posterior, Left Malar Cheek (2) Complexity: simple   Destruction method: cryotherapy   Informed consent: discussed and consent obtained   Timeout:  patient name, date of birth, surgical site, and procedure verified Lesion destroyed using liquid nitrogen: Yes   Cryotherapy cycles:  1 Outcome: patient tolerated procedure well with no complications   Post-procedure details: wound care instructions given   Lentigines - Scattered tan macules - Discussed due to sun exposure - Benign, observe - Call for any changes  Seborrheic Keratoses - Stuck-on, waxy, tan-brown papules and plaques  - Discussed benign etiology and prognosis. - Observe - Call for any changes  Melanocytic Nevi - Tan-brown and/or pink-flesh-colored symmetric macules and papules - Benign appearing on exam  today - Observation - Call clinic for new or changing moles - Recommend daily use of broad spectrum spf 30+ sunscreen to sun-exposed areas.   Hemangiomas - Red papules - Discussed benign nature - Observe - Call for any changes  Actinic Damage - diffuse scaly erythematous macules with underlying dyspigmentation - Recommend daily broad spectrum sunscreen SPF 30+ to sun-exposed areas, reapply every 2 hours as needed.  - Call for new or changing lesions.  Skin cancer screening performed today.

## 2019-12-02 ENCOUNTER — Ambulatory Visit: Payer: Medicare Other | Admitting: Neurology

## 2019-12-16 ENCOUNTER — Inpatient Hospital Stay: Payer: Medicare Other

## 2019-12-16 ENCOUNTER — Inpatient Hospital Stay: Payer: Medicare Other | Attending: Hematology | Admitting: Hematology

## 2019-12-16 ENCOUNTER — Other Ambulatory Visit: Payer: Self-pay

## 2019-12-16 VITALS — BP 112/68 | HR 61 | Temp 97.7°F | Resp 18 | Ht 63.0 in | Wt 166.1 lb

## 2019-12-16 DIAGNOSIS — C8238 Follicular lymphoma grade IIIa, lymph nodes of multiple sites: Secondary | ICD-10-CM | POA: Insufficient documentation

## 2019-12-16 DIAGNOSIS — I2510 Atherosclerotic heart disease of native coronary artery without angina pectoris: Secondary | ICD-10-CM | POA: Diagnosis not present

## 2019-12-16 DIAGNOSIS — Z86000 Personal history of in-situ neoplasm of breast: Secondary | ICD-10-CM | POA: Insufficient documentation

## 2019-12-16 DIAGNOSIS — Z79899 Other long term (current) drug therapy: Secondary | ICD-10-CM | POA: Diagnosis not present

## 2019-12-16 DIAGNOSIS — R42 Dizziness and giddiness: Secondary | ICD-10-CM | POA: Diagnosis not present

## 2019-12-16 LAB — CMP (CANCER CENTER ONLY)
ALT: 35 U/L (ref 0–44)
AST: 25 U/L (ref 15–41)
Albumin: 4.1 g/dL (ref 3.5–5.0)
Alkaline Phosphatase: 97 U/L (ref 38–126)
Anion gap: 11 (ref 5–15)
BUN: 13 mg/dL (ref 8–23)
CO2: 21 mmol/L — ABNORMAL LOW (ref 22–32)
Calcium: 10.8 mg/dL — ABNORMAL HIGH (ref 8.9–10.3)
Chloride: 109 mmol/L (ref 98–111)
Creatinine: 0.88 mg/dL (ref 0.44–1.00)
GFR, Est AFR Am: >60 mL/min (ref >60)
GFR, Estimated: >60 mL/min (ref >60)
Glucose, Bld: 106 mg/dL — ABNORMAL HIGH (ref 70–99)
Potassium: 4 mmol/L (ref 3.5–5.1)
Sodium: 141 mmol/L (ref 135–145)
Total Bilirubin: 1.6 mg/dL — ABNORMAL HIGH (ref 0.3–1.2)
Total Protein: 7.1 g/dL (ref 6.5–8.1)

## 2019-12-16 LAB — CBC WITH DIFFERENTIAL/PLATELET
Abs Immature Granulocytes: 0.02 10*3/uL (ref 0.00–0.07)
Basophils Absolute: 0.1 10*3/uL (ref 0.0–0.1)
Basophils Relative: 1 %
Eosinophils Absolute: 0.1 10*3/uL (ref 0.0–0.5)
Eosinophils Relative: 2 %
HCT: 44.8 % (ref 36.0–46.0)
Hemoglobin: 15.3 g/dL — ABNORMAL HIGH (ref 12.0–15.0)
Immature Granulocytes: 0 %
Lymphocytes Relative: 24 %
Lymphs Abs: 1.4 10*3/uL (ref 0.7–4.0)
MCH: 29.9 pg (ref 26.0–34.0)
MCHC: 34.2 g/dL (ref 30.0–36.0)
MCV: 87.5 fL (ref 80.0–100.0)
Monocytes Absolute: 0.5 10*3/uL (ref 0.1–1.0)
Monocytes Relative: 9 %
Neutro Abs: 3.8 10*3/uL (ref 1.7–7.7)
Neutrophils Relative %: 64 %
Platelets: 158 10*3/uL (ref 150–400)
RBC: 5.12 MIL/uL — ABNORMAL HIGH (ref 3.87–5.11)
RDW: 12.6 % (ref 11.5–15.5)
WBC: 6 10*3/uL (ref 4.0–10.5)
nRBC: 0 % (ref 0.0–0.2)

## 2019-12-16 LAB — LACTATE DEHYDROGENASE: LDH: 161 U/L (ref 98–192)

## 2019-12-16 NOTE — Progress Notes (Signed)
HEMATOLOGY/ONCOLOGY CONSULTATION NOTE  Date of Service: 12/16/2019  Patient Care Team: [name], MD as PCP - General (Internal Medicine) Stanford Breed [name], MD as PCP - Cardiology (Cardiology) [name], MD as Consulting Physician (Hematology) [name], RN (Inactive) as Oncology Nurse Navigator (Oncology) Cameron Sprang, MD as Consulting Physician (Neurology)  CHIEF COMPLAINTS/PURPOSE OF CONSULTATION:  Follicular lymphoma grade 3a  HISTORY OF PRESENTING ILLNESS:   CHRISTE Greer is a wonderful 79 y.o. female who has been referred to [name] by Dr. 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 She is accompanied today by a friend, and our Nurse Navigator [name]. The pt reports that she is doing well overall.   The pt reports that things began changing last September in 2018. She notes that she has taken Flonase, Claritin, and saline solution and did not have a runny nose, and did not have any breathing difficulty. She notes that her head felt as though she had a flu or a cold, without any other symptoms. She notes that she felt as if her ears were swollen and has had some minimal hearing impairment, and has myringotomy tubes. She began a steroid course about 4 months ago with Dr. [name]. She notes that she first noticed right sided neck swelling in the last month, and left sided neck swelling which began 2-3 weeks ago.   She obtained a CT Neck on 03/08/18, as noted below.   The pt notes that she has not had fevers or chills, but has woken up recently with night sweats occasionally over the last 6 months. She adds that she has noticed some SOB in the last 7-8 months which presents intermittently, and has not progressed. The pt notes that she has had some difficulty swallowing in the past, and saw Dr. [name] for concerns of a stricture, and adds that this she has had recent difficulty swallowing. The pt also notes that she has recently felt dizziness, described  as the room spinning. She has not had her dizziness worked up.  The pt notes that she had a mastectomy without radiation or chemotherapy for breast cancer in situ in 2013. She began tamoxifen preventatively afterwards for 2.5 years.   The pt notes that her interstitial cystitis cause is unknown. She notes that she took a TB vaccine 5-7 years ago, and developed severe pain and sepsis. She notes that she has not had recurrent UTIs and takes Elmiron under the care of Dr. [name] in urology. She notes that there are certain foods that she cannot eat without developing bladder pain. She also denies blood in the urine.  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 2. RIGHT lymphadenitis seen with superimposed infection or pericapsular spread of tumor.  Most recent lab results (03/08/18) of CBC w/diff and BMP is as follows: all values are WNL except for RBC at 5.22, HGB at 15.3.  On review of systems, pt reports some night sweats, some SOB, right neck swelling, left neck swelling, dizziness, hearing impairment, healed incision, right ear numbness, and denies fevers, chills, runny nose, abdominal pains, abdominal distension, blood in the urine, sinus pressure or pain, pain along the spine, leg swelling, skin rashes, and any other symptoms.   On PMHx the pt reports breast cancer in 2013, sternocleidomastoid release in childhood, interstitial  cystitis, sepsis. On Social Hx the pt denies ever smoking cigarettes and denies ever consuming alcohol  Interval History:  Rachael Greer is here today for management and evaluation of her Grade 3a Follicular Lymphoma, and after completing 3 cycles of BR. The patient's last visit with [name] was on 06/17/2019. The pt reports that she is doing  well overall.  The pt reports that she has continued to feel fullness in her ear. She has been seen by Dr. Redmond Baseman in the interim who did not find anything that could explain her symptoms. Pt has a rash that has been evaluated and was determined to be a mask rash. She has been experiencing night sweats that have not changed much since our last visit. Pt is considering a move back to her hometown in [place].   Lab results today (12/16/19) of CBC w/diff and CMP is as follows: all values are WNL except for RBC at 5.12, Hgb at 15.3, CO2 at 21, Glucose at 106, Calcium at 10.8, Total Bilirubin at 1.6. 12/16/2019 LDH at 161  On review of systems, pt reports ear fullness, night sweats, rashes and denies fevers, chills and any other symptoms.    MEDICAL HISTORY:  Past Medical History:  Diagnosis Date  . Acute on chronic renal failure 05/17/2012  . Acute respiratory failure with hypoxia 2/2 ALI 05/20/2012  . 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 Basal cell carcinoma 07/18/2019   Nodular right back- tx after bx  . Bilateral chronic serous otitis media 11/24/2017  . Breast cancer   . Chest pain   . Chronic diastolic heart failure, NYHA class 1 05/20/2012  . Demand ischemia 05/20/2012  . DOE (dyspnea on exertion)   . Dysfunction of both eustachian tubes 07/17/2017  . Essential hypertension 02/07/2010  . Eustachian tube obstruction, bilateral   . Fibromyalgia syndrome 02/25/2014  . GAD (generalized anxiety disorder) 07/11/2007   IMPRESSION: She will restart the Lexapro if she feels more anxious than now and come see me. She is stable for now.  . History of gastroesophageal reflux (GERD) 09/18/2011  . History of kidney stones   . Hyperlipidemia   . Hypokalemia 05/17/2012  . IBS (irritable bowel syndrome)   . Insomnia 01/18/2015  . Interstitial cystitis   . Leukocytosis 05/17/2012  . Major depressive disorder 11/29/2018  . Microcytic anemia 05/17/2012  . 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 Nephrolithiasis   . Non-allergic rhinitis 11/03/2017  . Palpitations   . Pre-diabetes   . Sepsis   . Squamous cell carcinoma of skin 01/23/2013   KA left chest- tx after biopsy    SURGICAL HISTORY: Past Surgical History:  Procedure Laterality Date  . BREAST DUCTAL SYSTEM EXCISION Right 10/30/2016   Procedure: RIGHT BREAST DUCT EXCISION;  Surgeon: Rolm Bookbinder, MD;  Location: [facility];  Service: General;  Laterality: Right;  . EYE SURGERY     bilateral lense placements  . IR IMAGING GUIDED PORT INSERTION  07/12/2018  . IR REMOVAL TUN ACCESS W/ PORT W/O FL MOD SED  03/03/2019  . MASTECTOMY Left   . NECK SURGERY     muscles cut in her neck  . NEPHROSTOMY    . TONSILLECTOMY      SOCIAL HISTORY: Social History   Socioeconomic History  . Marital status: Widowed    Spouse name: Not on file  . Number of children: 4  . Years of education: 16  . Highest education level: Bachelor's  degree (e.g., BA, AB, BS)  Occupational History  . Occupation: Retired  Tobacco Use  . Smoking status: Never Smoker  . Smokeless tobacco: Never Used  Vaping Use  . Vaping Use: Never used  Substance and Sexual Activity  . Alcohol use: No  . Drug use: No  . Sexual activity: Not Currently  Other Topics Concern  . Not on file  Social History Narrative   Right handed   One level home   Retired Corporate treasurer   Social Determinants of Radio broadcast assistant Strain:   . Difficulty of Paying Living Expenses:   Food Insecurity:   . Worried About Charity fundraiser in the Last Year:   . Arboriculturist in the Last Year:   Transportation Needs:   . Film/video editor (Medical):   Marland Kitchen Lack of Transportation (Non-Medical):   Physical Activity:   . Days of Exercise per Week:   . Minutes of Exercise per Session:   Stress:   . Feeling of Stress :   Social Connections:   . Frequency of Communication with Friends and Family:   . Frequency of Social  Gatherings with Friends and Family:   . Attends Religious Services:   . Active Member of Clubs or Organizations:   . Attends Archivist Meetings:   Marland Kitchen Marital Status:   Intimate Partner Violence:   . Fear of Current or Ex-Partner:   . Emotionally Abused:   Marland Kitchen Physically Abused:   . Sexually Abused:     FAMILY HISTORY: Family History  Problem Relation Age of Onset  . CAD Father   . Heart attack Father   . Memory loss Mother        Never formally diagnosed with dementia; symptom onset in late 10s    ALLERGIES:  is allergic to amoxicillin and no known allergies.  MEDICATIONS:  Current Outpatient Medications  Medication Sig Dispense Refill  . amLODipine (NORVASC) 2.5 MG tablet Take 1 tablet (2.5 mg total) by mouth daily. 90 tablet 2  . aspirin EC 81 MG tablet Take 1 tablet (81 mg total) by mouth daily. 90 tablet 3  . atorvastatin (LIPITOR) 20 MG tablet Take 1 tablet (20 mg total) by mouth daily at 6 PM. 90 tablet 1  . clonazePAM (KLONOPIN) 1 MG tablet Take 1 mg by mouth 2 (two) times daily as needed for anxiety.     . metoprolol succinate (TOPROL-XL) 25 MG 24 hr tablet Take 25 mg by mouth every evening.    . pentosan polysulfate (ELMIRON) 100 MG capsule Take 100 mg by mouth 3 (three) times daily.     . famotidine (PEPCID) 20 MG tablet Take 20 mg by mouth daily as needed.  (Patient not taking: Reported on 12/16/2019)    . Ibuprofen-Diphenhydramine HCl (ADVIL PM) 200-25 MG CAPS Take 0.5-1 tablets by mouth daily as needed (sleep).  (Patient not taking: Reported on 12/16/2019)     No current facility-administered medications for this visit.    REVIEW OF SYSTEMS:   A 10+ POINT REVIEW OF SYSTEMS WAS OBTAINED including neurology, dermatology, psychiatry, cardiac, respiratory, lymph, extremities, GI, GU, Musculoskeletal, constitutional, breasts, reproductive, HEENT.  All pertinent positives are noted in the HPI.  All others are negative.   PHYSICAL EXAMINATION:  GENERAL:alert, in  no acute distress and comfortable SKIN: no acute rashes, no significant lesions EYES: conjunctiva are pink and non-injected, sclera anicteric OROPHARYNX: MMM, no exudates, no oropharyngeal erythema or ulceration NECK: supple, no JVD LYMPH:  no palpable lymphadenopathy in the cervical, axillary or inguinal regions  LUNGS: clear to auscultation b/l with normal respiratory effort HEART: regular rate & rhythm ABDOMEN:  normoactive bowel sounds , non tender, not distended. No palpable hepatosplenomegaly.  Extremity: no pedal edema PSYCH: alert & oriented x 3 with fluent speech NEURO: no focal motor/sensory deficits  LABORATORY DATA:  I have reviewed the data as listed  . CBC Latest Ref Rng & Units 12/16/2019 06/17/2019 03/03/2019  WBC 4.0 - 10.5 K/uL 6.0 5.7 5.1  Hemoglobin 12.0 - 15.0 g/dL 15.3(H) 15.1(H) 14.6  Hematocrit 36 - 46 % 44.8 44.5 44.0  Platelets 150 - 400 K/uL 158 172 152   . CBC    Component Value Date/Time   WBC 6.0 12/16/2019 1145   RBC 5.12 (H) 12/16/2019 1145   HGB 15.3 (H) 12/16/2019 1145   HGB 16.3 (H) 11/02/2017 1529   HCT 44.8 12/16/2019 1145   HCT 46.7 (H) 11/02/2017 1529   PLT 158 12/16/2019 1145   PLT 192 11/02/2017 1529   MCV 87.5 12/16/2019 1145   MCV 85 11/02/2017 1529   MCH 29.9 12/16/2019 1145   MCHC 34.2 12/16/2019 1145   RDW 12.6 12/16/2019 1145   RDW 13.9 11/02/2017 1529   LYMPHSABS 1.4 12/16/2019 1145   LYMPHSABS 2.1 11/02/2017 1529   MONOABS 0.5 12/16/2019 1145   EOSABS 0.1 12/16/2019 1145   EOSABS 0.1 11/02/2017 1529   BASOSABS 0.1 12/16/2019 1145   BASOSABS 0.1 11/02/2017 1529     . CMP Latest Ref Rng & Units 12/16/2019 06/17/2019 02/11/2019  Glucose 70 - 99 mg/dL 106(H) 100(H) 107(H)  BUN 8 - 23 mg/dL 13 19 19   Creatinine 0.44 - 1.00 mg/dL 0.88 0.86 0.98  Sodium 135 - 145 mmol/L 141 141 138  Potassium 3.5 - 5.1 mmol/L 4.0 4.0 4.3  Chloride 98 - 111 mmol/L 109 108 109  CO2 22 - 32 mmol/L 21(L) 23 23  Calcium 8.9 - 10.3 mg/dL 10.8(H)  10.3 10.5(H)  Total Protein 6.5 - 8.1 g/dL 7.1 7.0 6.8  Total Bilirubin 0.3 - 1.2 mg/dL 1.6(H) 2.1(H) 1.6(H)  Alkaline Phos 38 - 126 U/L 97 91 79  AST 15 - 41 U/L 25 35 24  ALT 0 - 44 U/L 35 36 28    03/22/18 Flow Cytometry:   03/22/18 Biopsy:     RADIOGRAPHIC STUDIES: I have personally reviewed the radiological images as listed and agreed with the findings in the report. No results found.  ASSESSMENT & PLAN:   79 y.o. female with  1. 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 2. RIGHT lymphadenitis seen with superimposed infection or pericapsular spread of tumor.    03/22/18 biopsy revealed follicular lymphoma, Grade 3a   04/04/18 Hep B and Hep C negative   04/17/18 PET/CT revealed Notable bilateral adenopathy in the neck and posterior nasopharynx is hypermetabolic and mostly Deauville 5 level activity, compatible with malignancy. 2. No pathologically enlarged or significantly hypermetabolic adenopathy in the chest, or abdomen/pelvis. No splenomegaly. Slight enlargement of the photopenic splenic cyst noted on remote prior Exams. 3. Other imaging findings of potential clinical significance: Aortic Atherosclerosis. Coronary atherosclerosis. Diffuse hepatic steatosis. Nonobstructive left nephrolithiasis.   S/p C1 with Rituxan alone, then 3 cycles of combined BR completed by 08/02/18. Reduced Bendamustine to 70mg /m2 for interstitial cystitis. Chose not to treat with R-CHOP given interstitial cystitis.  08/22/18  PET/CT revealed Interval resolution of bilateral hypermetabolic cervical and posterior nasopharyngeal adenopathy. No evidence for residual or recurrent metabolically active tumor. 2. Aortic Atherosclerosis. Lad coronary artery calcifications. 3. New nonspecific 5 mm subpleural nodule is noted  within the posteromedial left upper lobe.   10/30/18 CT Maxillofacial which revealed "No recurrent mass in the face or pharynx. Mastoid sinus clear bilaterally. Middle ear clear bilaterally. Paranasal sinuses clear."  10/30/18 CT Neck which revealed "Interval resolution of nasopharyngeal mass and cervical adenopathy bilaterally. No residual mass or adenopathy identified in the neck."  Discussed again the indications to consider completing maintenance Rituxan every 2 months, however considering her concerns with interstitial cystitis, concerns for memory and other medical priorities, do not feel it is unreasonable to hold off on maintenance Rituxan. Pt agrees with this and would like to pursue watchful observation.   06/03/2019 PET/CT ([id number]) revealed "1. No  lymphoma recurrence on FDG PET scan.  No lymphadenopathy. 2. No evidence of breast cancer recurrence. 3. Resolution of small LEFT lung pulmonary nodule."  2. Interstitial cystitis - stable. -Continue follow up with Urology Dr. Alona Bene for interstitial cystitis.  PLAN:  -Discussed pt labwork today, 12/16/19; blood counts are steady, blood chemistries are okay, LDH is WNL -No lab or clinical evidence of recurrence/progression of pt's Follicular Lymphoma at this time. -Advised pt that sometimes there are age-relate changes to the tympanic membranes and ear ossicles that can cause a low-level fullness.  -Ensured pt that there was no sign of lymphoma on her last scan (06/03/2019). This would not explain her ear fullness.  -Will send referral to Oncologist in [place] to assist with the transfer of care when pt moves. Pt will contact when ready.  -Recommend OTC sterile saline spray and steam inhalers  . -Will see back in 6 months with labs    FOLLOW UP: RTC with Dr Irene Limbo with labs in 6 months   The total time spent in the appt was 20 minutes and more than 50% was on counseling and direct patient cares.  All of the patient's  questions were answered with apparent satisfaction. The patient knows to call the clinic with any problems, questions or concerns.    Sullivan Lone MD [place] AAHIVMS [facility] Hematology/Oncology Physician [facility]  (Office):       [phone] (Work cell):  [phone] (Fax):           [phone]  12/16/2019 2:22 PM  I, Yevette Edwards, am acting as a scribe for Dr. Sullivan Lone.   .I have reviewed the above documentation for accuracy and completeness, and I agree with the above. [name] MD

## 2019-12-17 ENCOUNTER — Telehealth: Payer: Self-pay | Admitting: Hematology

## 2019-12-17 NOTE — Telephone Encounter (Signed)
Scheduled per 07/06 los, patient has been called and notified.  

## 2019-12-23 ENCOUNTER — Ambulatory Visit: Payer: Medicare Other | Admitting: Physician Assistant

## 2019-12-26 ENCOUNTER — Telehealth: Payer: Self-pay | Admitting: Cardiology

## 2019-12-26 NOTE — Telephone Encounter (Signed)
Called patient, and advised that I would send a message to Dr.Crenshaw and his nurse- I did advise that he may not know of anyone, but she wanted him to check and see.  I advised it would be Monday since they were both out of office today.  Patient advised it was fine.

## 2019-12-26 NOTE — Telephone Encounter (Signed)
Patient calling stating she is moving to [facility] and will be going to live in a senior living facility. She states there are two hospitals close by,  [facility] and [facility]. She would like to know if Dr. Stanford Breed knows cardiologists there and if he could refer her to them.

## 2019-12-28 NOTE — Telephone Encounter (Signed)
I dont know cardiologists in [place]

## 2019-12-29 NOTE — Telephone Encounter (Signed)
Spoke with pt, Aware of dr crenshaw's recommendations.  °

## 2019-12-30 IMAGING — PT NM PET TUM IMG INITIAL (PI) SKULL BASE T - THIGH
1 of 8 series · 1 of 25 positions shown · non-contrast
Comparison: CT neck 03/08/2018

CLINICAL DATA: Initial treatment strategy for follicular lymphoma.

EXAM:
NUCLEAR MEDICINE PET SKULL BASE TO THIGH
TECHNIQUE: 7.8 mCi F-18 FDG was injected intravenously. Full-ring PET imaging
was performed from the skull base to thigh after the radiotracer. CT
data was obtained and used for attenuation correction and anatomic
localization.
Fasting blood glucose: 78 mg/dl

[Series 4: ct sk_thigh 5.0 b31f · axial · 5.0mm · 0.98mm/px · 1 of 219 slices shown]
[im 219/219  brain]
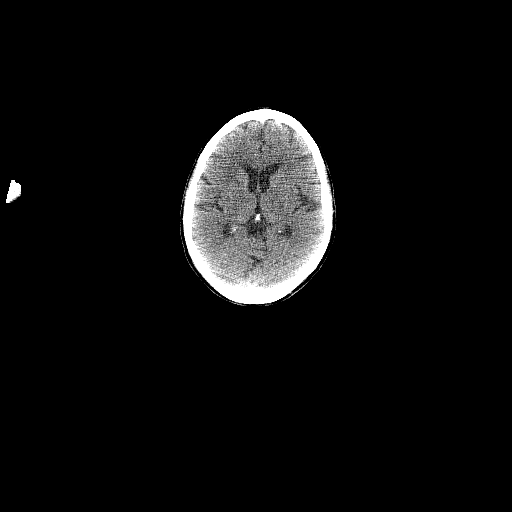

[1 of 25 positions shown; findings below may reference images not displayed]

FINDINGS: Mediastinal blood pool activity: SUV max

Background hepatic activity: SUV max

NECK: Bilateral prominent posterior nasopharyngeal soft tissue
appears hypermetabolic and roughly bilaterally symmetric, maximum
SUV 18.3.

Right parapharyngeal hypermetabolic activity has a maximum SUV of
11.9 and measures proximally 2.0 cm in diameter.

Bilateral conglomerate level IIa and IIb adenopathy along with
bilateral V adenopathy, bilateral III adenopathy, and bilateral
level IV adenopathy. There is likewise mildly hypermetabolic
bilateral supraclavicular adenopathy at the junction of the neck and
chest. Right level II adenopathy on image [DATE] measures 2.0 cm in
short axis (formerly 1.9 cm by my measurement on 03/08/2018) with
maximum SUV 10.3. A right level III lymph node measuring 1.9 cm in
short axis on image [DATE] (stable) has a maximum SUV of 11.1. A left
posterior level V lymph node measuring 0.9 cm in short axis on image
36/4 (formerly 1.1 cm) has a maximum SUV of 4.6.

Incidental CT findings: none

CHEST: Small left axillary lymph nodes with low-grade activity are
identified. An index node measuring 0.8 cm in short axis on image
50/4 has a maximum SUV of 2.2. No pathologic mediastinal or hilar
adenopathy.

Incidental CT findings: Left mastectomy. Coronary, aortic arch, and
branch vessel atherosclerotic vascular disease.

ABDOMEN/PELVIS: No significant abnormal hypermetabolic activity in
this region.

Incidental CT findings: Mild diffuse hepatic steatosis. Aortoiliac
atherosclerotic vascular disease. Nonobstructive left
nephrolithiasis. The spleen measures 12.7 by 4.3 by 12.0 cm (volume
= 340 cm^3) and has a 2.8 by 2.2 cm partially exophytic fluid
density lesion along its anterior margin which previously measured
2.1 by 1.6 cm back on 02/06/2013. This lesion appears photopenic.

SKELETON: No significant abnormal hypermetabolic activity in this
region.

Incidental CT findings: none
IMPRESSION: 1. Notable bilateral adenopathy in the neck and posterior
nasopharynx is hypermetabolic and mostly [HOSPITAL] 5 level activity,
compatible with malignancy.
2. No pathologically enlarged or significantly hypermetabolic
adenopathy in the chest, or abdomen/pelvis. No splenomegaly. Slight
enlargement of the photopenic splenic cyst noted on remote prior
exams.
3. Other imaging findings of potential clinical significance: Aortic
Atherosclerosis ([id number]-[id number].A). Coronary atherosclerosis. Diffuse
hepatic steatosis. Nonobstructive left nephrolithiasis.

## 2020-01-06 ENCOUNTER — Telehealth: Payer: Self-pay | Admitting: Hematology

## 2020-01-06 NOTE — Telephone Encounter (Signed)
[id number] Faxed medical records to [facility] @ fax [phone]

## 2020-01-12 ENCOUNTER — Ambulatory Visit: Payer: Medicare Other

## 2020-01-12 DIAGNOSIS — C8231 Follicular lymphoma grade IIIa, lymph nodes of head, face, and neck: Secondary | ICD-10-CM

## 2020-01-12 DIAGNOSIS — C44529 Squamous cell carcinoma of skin of other part of trunk: Secondary | ICD-10-CM

## 2020-01-12 DIAGNOSIS — D241 Benign neoplasm of right breast: Secondary | ICD-10-CM

## 2020-01-12 DIAGNOSIS — Z710 Person encountering health services to consult on behalf of another person: Secondary | ICD-10-CM

## 2020-01-12 DIAGNOSIS — L820 Inflamed seborrheic keratosis: Secondary | ICD-10-CM

## 2020-01-12 DIAGNOSIS — N6041 Mammary duct ectasia of right breast: Secondary | ICD-10-CM

## 2020-01-12 DIAGNOSIS — C44519 Basal cell carcinoma of skin of other part of trunk: Secondary | ICD-10-CM

## 2020-01-12 DIAGNOSIS — L570 Actinic keratosis: Secondary | ICD-10-CM

## 2020-01-15 LAB — SURGICAL PATHOLOGY CONSULT

## 2020-01-22 ENCOUNTER — Ambulatory Visit: Payer: Medicare Other | Admitting: Podiatry

## 2020-01-22 ENCOUNTER — Other Ambulatory Visit: Payer: Self-pay

## 2020-01-22 DIAGNOSIS — B351 Tinea unguium: Secondary | ICD-10-CM

## 2020-01-22 DIAGNOSIS — M79675 Pain in left toe(s): Secondary | ICD-10-CM | POA: Diagnosis not present

## 2020-01-22 DIAGNOSIS — L600 Ingrowing nail: Secondary | ICD-10-CM | POA: Diagnosis not present

## 2020-01-22 MED ORDER — DOXYCYCLINE HYCLATE 100 MG PO TABS
100.0000 mg | ORAL_TABLET | Freq: Two times a day (BID) | ORAL | 0 refills | Status: AC
Start: 2020-01-22 — End: ?

## 2020-01-22 NOTE — Patient Instructions (Signed)

## 2020-01-23 NOTE — Progress Notes (Signed)
Subjective: 79 year old female presents the office today for concerns of her left big toe becoming loose extremity pain on the medial aspect the nail that she points to. She appears injury about a year ago she states reticulating here for nail grow out. As the nail grew out it became thickened discolored nail causing discomfort. Denies any drainage or pus. Denies any systemic complaints such as fevers, chills, nausea, vomiting. No acute changes since last appointment, and no other complaints at this time.   Objective: AAO x3, NAD DP/PT pulses palpable bilaterally, CRT less than 3 seconds Left hallux toenail is hypertrophic, dystrophic with yellow, brown discoloration. There is tenderness on the medial aspect of the left hallux toenail with localized edema and erythema. There is no drainage or pus placement cellulitis. No pain with calf compression, swelling, warmth, erythema  Assessment: Left hallux onychomycosis, ingrown toenail  Plan: -All treatment options discussed with the patient including all alternatives, risks, complications.  -At this time, the patient is requesting partial nail removal with chemical matricectomy to the symptomatic portion of the nail. Risks and complications were discussed with the patient for which they understand and written consent was obtained. Under sterile conditions a total of 3 mL of a mixture of 2% lidocaine plain and 0.5% Marcaine plain was infiltrated in a hallux block fashion. Once anesthetized, the skin was prepped in sterile fashion. A tourniquet was then applied. Next the medial aspect of hallux nail border was then sharply excised making sure to remove the entire offending nail border. Once the nails were ensured to be removed area was debrided and the underlying skin was intact. There is no purulence identified in the procedure. Next phenol was then applied under standard conditions and copiously irrigated. Silvadene was applied. A dry sterile dressing was  applied. After application of the dressing the tourniquet was removed and there is found to be an immediate capillary refill time to the digit. The patient tolerated the procedure well any complications. Post procedure instructions were discussed the patient for which he verbally understood. Follow-up in one week for nail check or sooner if any problems are to arise. Discussed signs/symptoms of infection and directed to call the office immediately should any occur or go directly to the emergency room. In the meantime, encouraged to call the office with any questions, concerns, changes symptoms. -Nail sent to pathology  -Doxycycline -Patient encouraged to call the office with any questions, concerns, change in symptoms.   Trula Slade DPM

## 2020-01-29 NOTE — Progress Notes (Signed)
HPI: FUpalpitations. Holter monitor April 2015 showed sinus rhythm with PACs, PVCs and brief PAT.Stress echo November 2015 was normal. Echo January 2018 showed normal LV function and trace mitral regurgitation. Had previous PET scan for lymphoma and atherosclerosis noted in aorta and coronaries.Since last seen,the patient has dyspnea with more extreme activities but not with routine activities. It is relieved with rest. It is not associated with chest pain. There is no orthopnea, PND or pedal edema. There is no syncope or palpitations. There is no exertional chest pain.   Current Outpatient Medications  Medication Sig Dispense Refill   amLODipine (NORVASC) 2.5 MG tablet Take 1 tablet (2.5 mg total) by mouth daily. 90 tablet 2   aspirin EC 81 MG tablet Take 1 tablet (81 mg total) by mouth daily. 90 tablet 3   atorvastatin (LIPITOR) 20 MG tablet Take 1 tablet (20 mg total) by mouth daily at 6 PM. 90 tablet 1   clonazePAM (KLONOPIN) 1 MG tablet Take 1 mg by mouth 2 (two) times daily as needed for anxiety.      doxycycline (VIBRA-TABS) 100 MG tablet Take 1 tablet (100 mg total) by mouth 2 (two) times daily. 14 tablet 0   famotidine (PEPCID) 20 MG tablet Take 20 mg by mouth daily as needed.      Ibuprofen-Diphenhydramine HCl (ADVIL PM) 200-25 MG CAPS Take 0.5-1 tablets by mouth daily as needed (sleep).      metoprolol succinate (TOPROL-XL) 25 MG 24 hr tablet Take 25 mg by mouth every evening.     pentosan polysulfate (ELMIRON) 100 MG capsule Take 100 mg by mouth 3 (three) times daily.      No current facility-administered medications for this visit.     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 She is stable for now.   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   Nephrolithiasis    Non-allergic rhinitis 11/03/2017   Palpitations    Pre-diabetes    Sepsis    Squamous cell carcinoma of skin 01/23/2013   KA left chest- tx after biopsy    Past Surgical History:  Procedure Laterality Date   BREAST DUCTAL SYSTEM EXCISION Right 10/30/2016   Procedure: RIGHT BREAST DUCT EXCISION;  Surgeon: Rolm Bookbinder, MD;  Location: [place];  Service: General;  Laterality: Right;   EYE SURGERY     bilateral lense placements   IR  IMAGING GUIDED PORT INSERTION  07/12/2018   IR REMOVAL TUN ACCESS W/ PORT W/O FL MOD SED  03/03/2019   MASTECTOMY Left    NECK SURGERY     muscles cut in her neck   NEPHROSTOMY     TONSILLECTOMY      Social History   Socioeconomic History   Marital status: Widowed    Spouse name: Not on file   Number of children: 4   Years of education: 16   Highest education level: Bachelor's degree (e.g., BA, AB, BS)  Occupational History   Occupation: Retired  Tobacco Use   Smoking status: Never Smoker   Smokeless tobacco: Never Used  Scientific laboratory technician Use:  Never used  Substance and Sexual Activity   Alcohol use: No   Drug use: No   Sexual activity: Not Currently  Other Topics Concern   Not on file  Social History Narrative   Right handed   One level home   Retired Corporate treasurer   Social Determinants of Radio broadcast assistant Strain:    Difficulty of Paying Living Expenses: Not on file  Food Insecurity:    Worried About Charity fundraiser in the Last Year: Not on file   [employer] of Food in the Last Year: Not on file  Transportation Needs:    Lack of Transportation (Medical): Not on file   Lack of Transportation (Non-Medical): Not on file  Physical Activity:    Days of Exercise per Week: Not on file   Minutes of Exercise per Session: Not on file  Stress:    Feeling of Stress : Not on file  Social Connections:    Frequency of Communication with Friends and Family: Not on file   Frequency of Social Gatherings with Friends and Family: Not on file   Attends Religious Services: Not on file   Active Member of Clubs or Organizations: Not on file   Attends Archivist Meetings: Not on file   Marital Status: Not on file  Intimate Partner Violence:    Fear of Current or Ex-Partner: Not on file   Emotionally Abused: Not on file   Physically Abused: Not on file   Sexually Abused: Not on file    Family History  Problem Relation Age of Onset   CAD Father    Heart attack Father    Memory loss Mother        Never formally diagnosed with dementia; symptom onset in late 70s    ROS: no fevers or chills, productive cough, hemoptysis, dysphasia, odynophagia, melena, hematochezia, dysuria, hematuria, rash, seizure activity, orthopnea, PND, pedal edema, claudication. Remaining systems are negative.  Physical Exam: Well-developed well-nourished in no acute distress.  Skin is warm and dry.  HEENT is normal.  Neck is supple.  Chest is clear to auscultation with normal expansion.  Cardiovascular exam is regular  rate and rhythm.  Abdominal exam nontender or distended. No masses palpated. Extremities show no edema. neuro grossly intact  ECG-normal sinus rhythm at a rate of 68, no ST changes.  Personally reviewed  A/P  1 coronary calcification-no CP; continue ASA and statin.  2 Hypertension-BP controlled; continue present meds and follow.  3 palpitations-continue beta-blocker.  Symptoms are reasonably well controlled.  4 hyperlipidemia-continue statin.  5 history of lymphoma-Per oncology.  Kirk Ruths, MD

## 2020-02-03 ENCOUNTER — Ambulatory Visit: Payer: Medicare Other | Admitting: Cardiology

## 2020-02-03 ENCOUNTER — Other Ambulatory Visit: Payer: Self-pay

## 2020-02-03 ENCOUNTER — Encounter: Payer: Self-pay | Admitting: Cardiology

## 2020-02-03 VITALS — BP 118/62 | HR 68 | Ht 63.0 in | Wt 167.0 lb

## 2020-02-03 DIAGNOSIS — I2510 Atherosclerotic heart disease of native coronary artery without angina pectoris: Secondary | ICD-10-CM | POA: Diagnosis not present

## 2020-02-03 DIAGNOSIS — E7800 Pure hypercholesterolemia, unspecified: Secondary | ICD-10-CM | POA: Diagnosis not present

## 2020-02-03 DIAGNOSIS — R002 Palpitations: Secondary | ICD-10-CM | POA: Diagnosis not present

## 2020-02-03 DIAGNOSIS — I10 Essential (primary) hypertension: Secondary | ICD-10-CM

## 2020-02-03 MED ORDER — METOPROLOL SUCCINATE ER 25 MG PO TB24: 25.0000 mg | ORAL_TABLET | Freq: Every evening | ORAL | 3 refills | Status: AC

## 2020-02-03 MED ORDER — ATORVASTATIN CALCIUM 20 MG PO TABS: 20.0000 mg | ORAL_TABLET | Freq: Every day | ORAL | 3 refills | Status: AC

## 2020-02-03 MED ORDER — AMLODIPINE BESYLATE 2.5 MG PO TABS: 2.5000 mg | ORAL_TABLET | Freq: Every day | ORAL | 3 refills | Status: AC

## 2020-02-03 NOTE — Patient Instructions (Signed)
Medication Instructions:  NO CHANGE *If you need a refill on your cardiac medications before your next appointment, please call your pharmacy*   Lab Work: If you have labs (blood work) drawn today and your tests are completely normal, you will receive your results only by: Marland Kitchen MyChart Message (if you have MyChart) OR . A paper copy in the mail If you have any lab test that is abnormal or we need to change your treatment, we will call you to review the results.   Follow-Up: At [facility], you and your health needs are our priority.  As part of our continuing mission to provide you with exceptional heart care, we have created designated Provider Care Teams.  These Care Teams include your primary Cardiologist (physician) and Advanced Practice Providers (APPs -  Physician Assistants and Nurse Practitioners) who all work together to provide you with the care you need, when you need it.  We recommend signing up for the patient portal called "MyChart".  Sign up information is provided on this After Visit Summary.  MyChart is used to connect with patients for Virtual Visits (Telemedicine).  Patients are able to view lab/test results, encounter notes, upcoming appointments, etc.  Non-urgent messages can be sent to your provider as well.   To learn more about what you can do with MyChart, go to [email or web address].    Your next appointment:    AS NEEDED

## 2020-02-10 ENCOUNTER — Other Ambulatory Visit: Payer: Self-pay

## 2020-02-10 ENCOUNTER — Ambulatory Visit: Payer: Medicare Other | Admitting: Podiatry

## 2020-02-10 ENCOUNTER — Encounter: Payer: Self-pay | Admitting: Podiatry

## 2020-02-10 DIAGNOSIS — L600 Ingrowing nail: Secondary | ICD-10-CM

## 2020-02-10 DIAGNOSIS — B351 Tinea unguium: Secondary | ICD-10-CM

## 2020-02-19 NOTE — Progress Notes (Addendum)
Subjective: 79 year old female presents the office today for follow-up evaluation after undergoing ingrown toenail left big toe.  She states that she is doing much better and is healed however the toenails are thick and starting to become loose.  She wants to have both the big toenails checked. Denies any systemic complaints such as fevers, chills, nausea, vomiting. No acute changes since last appointment, and no other complaints at this time.   Objective: AAO x3, NAD DP/PT pulses palpable bilaterally, CRT less than 3 seconds There is no pain along the area of ingrown toenails there is no edema, erythema.  Bilateral hallux toenails are hypertrophic, dystrophic with yellow discoloration.  There is no signs of infection noted today.  Nails are loose at the distal aspect of adhered along the majority of the toenail. No pain with calf compression, swelling, warmth, erythema  Assessment: Ingrown toenails with onychomycosis  Plan: -All treatment options discussed with the patient including all alternatives, risks, complications.  -Awaiting nail culture results.  Ingrown toenail sites are doing well. -Patient encouraged to call the office with any questions, concerns, change in symptoms.   Trula Slade DPM

## 2020-02-23 ENCOUNTER — Ambulatory Visit (HOSPITAL_COMMUNITY): Payer: Self-pay | Admitting: Student in an Organized Health Care Education/Training Program

## 2020-03-01 ENCOUNTER — Ambulatory Visit (INDEPENDENT_AMBULATORY_CARE_PROVIDER_SITE_OTHER): Payer: Self-pay | Admitting: Otolaryngology

## 2020-04-01 ENCOUNTER — Encounter (HOSPITAL_BASED_OUTPATIENT_CLINIC_OR_DEPARTMENT_OTHER): Payer: Self-pay

## 2020-04-01 ENCOUNTER — Ambulatory Visit (HOSPITAL_BASED_OUTPATIENT_CLINIC_OR_DEPARTMENT_OTHER): Payer: Self-pay | Admitting: Internal Medicine

## 2020-04-14 ENCOUNTER — Encounter (INDEPENDENT_AMBULATORY_CARE_PROVIDER_SITE_OTHER): Payer: Self-pay | Admitting: Otolaryngology

## 2020-04-14 ENCOUNTER — Ambulatory Visit: Payer: Medicare Other | Attending: Otolaryngology | Admitting: Otolaryngology

## 2020-04-14 ENCOUNTER — Other Ambulatory Visit: Payer: Self-pay

## 2020-04-14 VITALS — BP 147/68 | HR 97 | Temp 98.0°F | Ht 63.0 in | Wt 165.3 lb

## 2020-04-14 DIAGNOSIS — Z9622 Myringotomy tube(s) status: Secondary | ICD-10-CM

## 2020-04-14 DIAGNOSIS — H6900 Patulous Eustachian tube, unspecified ear: Secondary | ICD-10-CM | POA: Insufficient documentation

## 2020-04-14 NOTE — Procedures (Signed)
[facility], [place]  [place] [id number]  Operated by [facility]  Procedure Note    Name: Sharmaine Bain MRN:  [id number]   Date: 04/14/2020 Age: 79 y.o.           68-Binocular Microscopy    Date/Time: 04/14/2020 4:04 PM  Performed by: Kerry Dory, MD  Authorized by: Emeterio Reeve, MD     Procedure details:     Indications: examination of tympanic membrane    Right ear canal:     normal    Right tympanic membrane:     Perforation: no perforation      Effusion: no effusion    Left ear canal:     normal    Left tympanic membrane:     Perforation: no perforation      Effusion: no effusion      Other findings: (Tympanostomy tube in place. )      I was present and supervised/observed the entire procedure.  Emeterio Reeve, MD

## 2020-04-14 NOTE — Progress Notes (Signed)
[facility]  CLINIC H&P    Name: Connie Pineda, 79 y.o. female  MRN: [id number]  Date of Birth: [birth year]  Date of Service: 04/14/2020    Chief Complaint:    Chief Complaint   Patient presents with    Hearing Problem     History of Present Illness: Connie Pineda is a 79 y.o. female with history of mastectomy, interstitial cystitis, and non-hodgkin's lymphoma who presents to Dr. Silver Huguenin clinic today with CC of bilateral aural fullness and autophony. She reports that it has been present for around 3 years. She reports feeling pressure within her ears and hearing an echo noticeable when speaks herself. She denies any symptoms of otalgia, otorrhea, tinnitus, and vertigo bilaterally. She has seen multiple ENT specialists for this issue who believed she might be presenting with patulous eustachian tube. She endorses weight gain and denies any significant weight loss. She reports having bilateral ventilation tubes in the past to observe for alleviation in the patient's chief complaints. Patient reports that the procedure failed to alleviate her symptoms. ROS was otherwise negative.     Past Medical History:  Past Medical History:   Diagnosis Date    Cancer ([employer])     Hearing loss     Heart disease      Past Surgical History:  Past Surgical History:   Procedure Laterality Date    Hx tonsillectomy      Hx ventilatory tubes      Mastectomy, partial       Medications:  Outpatient Medications Marked as Taking for the 04/14/20 encounter (Office Visit) with Emeterio Reeve, MD   Medication Sig    amLODIPine (NORVASC) 2.5 mg Oral Tablet Take 2.5 mg by mouth    aspirin (ECOTRIN) 81 mg Oral Tablet, Delayed Release (E.C.) Take 81 mg by mouth    atorvastatin (LIPITOR) 20 mg Oral Tablet Take 20 mg by mouth    cholecalciferol, vitamin D3, 25 mcg (1,000 unit) Oral Tablet Take 1,000 Units by mouth Once a day    clonazePAM (KLONOPIN) 1 mg Oral Tablet TAKE 1 TABLET BY MOUTH TWICE  DAILY AS NEEDED FOR ANXIETY( HIGH TONE PELVIC FLOOR DYSFUNCTION)    ELMIRON 100 mg Oral Capsule TAKE 1 CAPSULE BY MOUTH THREE TIMES DAILY BEFORE MEALS ON AN EMPTY STOMACH    metoprolol succinate (TOPROL-XL) 25 mg Oral Tablet Sustained Release 24 hr Take 25 mg by mouth    zinc Sulfate (ZINCATE) 50 mg zinc (220 mg) Oral Tablet Take 50 mg by mouth Once a day      Family History:  Family Medical History:       Problem Relation (Age of Onset)    Heart Disease Father    Thyroid Cancer Mother          Social History:  Social History     Occupational History    Not on file   Tobacco Use    Smoking status: Never Smoker    Smokeless tobacco: Never Used   Substance and Sexual Activity    Alcohol use: Never    Drug use: Never    Sexual activity: Not on file     Allergies:  No Known Allergies    Review of Systems:    Do you have any fevers: no   Any weight change: yes   Change in your vision: no    Chest Pain: no   Shortness of Breath: yes  Stomach pain: no   Urinary difficulity: no   Joint Pain: no   Skin Problems: no   Weakness or Numbness: no   Easy Bruising or Bleeding: no   Excessive Thirst: no   Seasonal Allergies: no    All other systems reviewed and found to be negative.    Physical Exam:  BP (Non-Invasive): (!) 147/68 Temperature: 36.7 C (98 F) Heart Rate: 97      Height: 160 cm (5\' 3" ) Weight: 75 kg (165 lb 5.5 oz) Body mass index is 29.29 kg/m.    General Appearance: Pleasant, cooperative, healthy, and in no acute distress.  Eyes: Conjunctivae/corneas clear, PERRLA, EOM's intact.  Head and Face: Normocephalic, atraumatic.  Face symmetric, no obvious lesions.   Binocular microscopy necessary on exam today for diagnostic and/or treatment purposes; See findings below:  Pinnae: Normal shape and position.   External auditory canals:  Patent without inflammation.  Tympanic membranes:  Intact, translucent, midposition, middle ear aerated. Tympanostomy in place within the Anterior-Inferior quadrant of the tympanic  membrane bilatearlly.   Nose:  External pyramid grossly midline.   Oral Cavity/Oropharynx: No mucosal lesions, masses, or pharyngeal asymmetry.  Hypopharynx/Larynx: Voice normal.  Cardiovascular:  Good perfusion of upper extremities.  No cyanosis of the hands or fingers.  Lungs: No apparent stridorous breathing. No acute distress. Equal work of breathing without any tachypnea or accessory muscle use   Skin: Skin warm and dry.  Neurologic: Cranial nerves:  grossly intact.  Psychiatric:  Alert and oriented x 3.    Procedure:  ENT, [place]  [place] [id number]  Operated by [facility]  Procedure Note    Name: Anahis Furgeson MRN:  [id number]   Date: 04/14/2020 Age: 79 y.o.           39-Binocular Microscopy    Date/Time: 04/14/2020 4:04 PM  Performed by: Kerry Dory, MD  Authorized by: Emeterio Reeve, MD     Procedure details:     Indications: examination of tympanic membrane    Right ear canal:     normal    Right tympanic membrane:     Perforation: no perforation      Effusion: no effusion    Left ear canal:     normal    Left tympanic membrane:     Perforation: no perforation      Effusion: no effusion      Other findings: (Tympanostomy tube in place. )          Assessment:    Connie Pineda is a 79 y/o with history of mastectomy, interstitial cystitis, and non-hodgkin's lymphoma who presents to Dr. Silver Huguenin clinic today with CC of bilateral aural fullness and autophony. Her symptoms mostly coincide with patulous eustachian tube dysfunction and she reports no improvement in her CC with BVT placement. We will have her follow-up with Dr. Roslyn Smiling for further evaluation and consideration of other treatments including injection.       ICD-10-CM    1. Patulous Eustachian tube, unspecified laterality  H69.00 92504-Binocular Microscopy   2. S/P tympanostomy tube placement  Z96.22 92504-Binocular Microscopy         Plan:  Orders Placed This Encounter    92504-Binocular Microscopy      1. Schedule appointment with with Dr. Roslyn Smiling for further evaluation and consideration of other treatments for her patulous eustachian tube including injection.       Kerry Dory, MD 04/14/2020 16:06  Emeterio Reeve, MD    CC:    PCP Pcp Not In System  No address on file   Referring Provider Self, Referral  No address on file       I saw and examined the patient.  I reviewed the resident's note.  I agree with the findings and plan of care as documented in the resident's note.  Any exceptions/additions are edited/noted.    Emeterio Reeve, MD

## 2020-04-26 ENCOUNTER — Ambulatory Visit (INDEPENDENT_AMBULATORY_CARE_PROVIDER_SITE_OTHER): Payer: Medicare Other | Admitting: Otolaryngology

## 2020-05-04 ENCOUNTER — Other Ambulatory Visit (HOSPITAL_BASED_OUTPATIENT_CLINIC_OR_DEPARTMENT_OTHER): Payer: Self-pay | Admitting: Internal Medicine

## 2020-05-04 DIAGNOSIS — C8590 Non-Hodgkin lymphoma, unspecified, unspecified site: Secondary | ICD-10-CM

## 2020-05-10 ENCOUNTER — Ambulatory Visit (HOSPITAL_BASED_OUTPATIENT_CLINIC_OR_DEPARTMENT_OTHER): Payer: Medicare Other

## 2020-05-10 ENCOUNTER — Other Ambulatory Visit: Payer: Self-pay

## 2020-05-10 ENCOUNTER — Encounter (HOSPITAL_BASED_OUTPATIENT_CLINIC_OR_DEPARTMENT_OTHER): Payer: Self-pay | Admitting: Internal Medicine

## 2020-05-10 ENCOUNTER — Ambulatory Visit: Payer: Medicare Other | Attending: Internal Medicine | Admitting: Internal Medicine

## 2020-05-10 ENCOUNTER — Ambulatory Visit (HOSPITAL_BASED_OUTPATIENT_CLINIC_OR_DEPARTMENT_OTHER)
Admission: RE | Admit: 2020-05-10 | Discharge: 2020-05-10 | Disposition: A | Discharge status: Home / Self Care | Payer: Medicare Other | Source: Ambulatory Visit | Attending: Internal Medicine | Admitting: Internal Medicine

## 2020-05-10 VITALS — BP 145/81 | HR 62 | Temp 97.5°F | Resp 18 | Ht 62.17 in | Wt 168.0 lb

## 2020-05-10 DIAGNOSIS — Z853 Personal history of malignant neoplasm of breast: Secondary | ICD-10-CM | POA: Insufficient documentation

## 2020-05-10 DIAGNOSIS — Z9889 Other specified postprocedural states: Secondary | ICD-10-CM | POA: Insufficient documentation

## 2020-05-10 DIAGNOSIS — N3010 Interstitial cystitis (chronic) without hematuria: Secondary | ICD-10-CM | POA: Insufficient documentation

## 2020-05-10 DIAGNOSIS — Z9012 Acquired absence of left breast and nipple: Secondary | ICD-10-CM | POA: Insufficient documentation

## 2020-05-10 DIAGNOSIS — H6900 Patulous Eustachian tube, unspecified ear: Secondary | ICD-10-CM | POA: Insufficient documentation

## 2020-05-10 DIAGNOSIS — C8231 Follicular lymphoma grade IIIa, lymph nodes of head, face, and neck: Secondary | ICD-10-CM | POA: Insufficient documentation

## 2020-05-10 DIAGNOSIS — Z8572 Personal history of non-Hodgkin lymphomas: Secondary | ICD-10-CM

## 2020-05-10 DIAGNOSIS — Z808 Family history of malignant neoplasm of other organs or systems: Secondary | ICD-10-CM | POA: Insufficient documentation

## 2020-05-10 DIAGNOSIS — C8590 Non-Hodgkin lymphoma, unspecified, unspecified site: Secondary | ICD-10-CM

## 2020-05-10 DIAGNOSIS — Z85828 Personal history of other malignant neoplasm of skin: Secondary | ICD-10-CM | POA: Insufficient documentation

## 2020-05-10 LAB — PLATELETS AND ANC CANCER CENTER
NEUTROPHILS #: 4.09 10*3/uL (ref 1.50–7.70)
PLATELET COUNT: 188 10*3/uL (ref 150–400)

## 2020-05-10 LAB — COMPREHENSIVE METABOLIC PANEL, NON-FASTING
ALBUMIN: 4.1 g/dL (ref 3.4–4.8)
ALKALINE PHOSPHATASE: 92 U/L (ref 55–145)
ALT (SGPT): 34 U/L — ABNORMAL HIGH (ref 8–22)
ANION GAP: 9 mmol/L (ref 4–13)
AST (SGOT): 27 U/L (ref 8–45)
BILIRUBIN TOTAL: 2 mg/dL — ABNORMAL HIGH (ref 0.3–1.3)
BUN/CREA RATIO: 17 (ref 6–22)
BUN: 17 mg/dL (ref 8–25)
CALCIUM: 11.2 mg/dL — ABNORMAL HIGH (ref 8.8–10.2)
CHLORIDE: 103 mmol/L (ref 96–111)
CO2 TOTAL: 25 mmol/L (ref 23–31)
CREATININE: 1.03 mg/dL (ref 0.60–1.05)
GLUCOSE: 98 mg/dL (ref 65–125)
POTASSIUM: 3.8 mmol/L (ref 3.5–5.1)
PROTEIN TOTAL: 7.3 g/dL (ref 6.0–8.0)
SODIUM: 137 mmol/L (ref 136–145)

## 2020-05-10 LAB — CBC WITH DIFF
BASOPHIL #: <0.1 10*3/uL (ref <=0.20)
BASOPHIL %: 1 %
EOSINOPHIL #: 0.15 10*3/uL (ref <=0.50)
EOSINOPHIL %: 2 %
HCT: 44.7 % (ref 34.8–46.0)
HGB: 15 g/dL (ref 11.5–16.0)
IMMATURE GRANULOCYTE #: <0.1 10*3/uL (ref <0.10)
IMMATURE GRANULOCYTE %: 0 % (ref 0–1)
LYMPHOCYTE #: 1.83 10*3/uL (ref 1.00–4.80)
LYMPHOCYTE %: 27 %
MCH: 29.1 pg (ref 26.0–32.0)
MCHC: 33.6 g/dL (ref 31.0–35.5)
MCV: 86.6 fL (ref 78.0–100.0)
MONOCYTE #: 0.57 10*3/uL (ref 0.20–1.10)
MONOCYTE %: 9 %
MPV: 10.4 fL (ref 8.7–12.5)
NEUTROPHIL #: 4.09 10*3/uL (ref 1.50–7.70)
NEUTROPHIL %: 61 %
PLATELETS: 188 10*3/uL (ref 150–400)
RBC: 5.16 10*6/uL (ref 3.85–5.22)
RDW-CV: 12.9 % (ref 11.5–15.5)
WBC: 6.7 10*3/uL (ref 3.7–11.0)

## 2020-05-10 LAB — LDH: LDH: 182 U/L (ref 125–220)

## 2020-05-10 NOTE — Nurses Notes (Signed)
1110  Ultra sound piv placed per flow sheets.  Labs drawn and sent.  PIV flushed with NS and capped. Pt tolerated procedure well.  Pt ambulated to clinic appointment.  Heath Lark, RN

## 2020-05-10 NOTE — Nurses Notes (Signed)
Diagnosis: Lymphoma  Diagnosis: Lymphoma. [place] disease specific written information provided and verbally reviewed.  Patient assessed for barriers to learning, language, learning preference, and teaching needs.    Distress tool completed, level 2/10. Patient seen by Ulice Bold, MSW to discuss her role and how she can assist with financial issues if they arise.    Patient educated on plan of care for today's visit and reviewed new patient folder and its contents.    [facility] telephone triage brochure and magnet provided with verbal instructions on how to access this support should the patient develop symptoms, side effects and when necessary go to a local emergency room.    Verbally reviewed that appointments may be rescheduled but missed appointments will be followed up with a phone call or letter from our clinic. Provided a written copy of [facility]'s and [facility]'s appointment policies.    Oriented to the [facility].Verbally reviewed support services offered at the [facility]: home medical supply service, chaplain, psychosocial/supportive care clinic, registered dietician, social workers, pharmacy specialists, financial counselors.    Patient will return in 3 months with CT scan head and neck (with contrast).  All questions/concerns answered during this visit.  Patient verbalized understanding.  Adora Fridge, RN  05/10/2020, 13:33

## 2020-05-10 NOTE — Nursing Note (Signed)
1215 - PIV removed with catheter intact per protocol. Pressure held at site. No bleeding noted.  Bandage applied. Pt ambulatory to discharge. Salvadore Oxford, LPN

## 2020-05-11 ENCOUNTER — Encounter (HOSPITAL_BASED_OUTPATIENT_CLINIC_OR_DEPARTMENT_OTHER): Payer: Self-pay

## 2020-05-11 NOTE — Cancer Center Note (Signed)
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     - 03/08/18:  CT neck revealed abnormal enlargement of nasopharyngeal soft tissues and pathological lymphadenopathy in the neck.   - 03/22/18: right cervical lymph node and nasopharyngeal biopsy confirmed follicular lymphoma, grade 3A.  - 04/17/18:  PET-CT revealed notable bilateral adenopathy in the neck and posterior nasopharynx, hypermetabolic consistent with malignancy.  No pathologically enlarged or significantly hypermetabolic adenopathy in the chest, abdomen or pelvis.  No splenomegaly.   - 11/19-2/21/20:  Status post cycle 1 of Rituxan alone, then 3 cycles of combined bendamustine and Rituximab completed by 08/02/18.  Bendamustine dose reduced to 70 milligrams/meter squared for interstitial cystitis.  - 08/22/18:  PET-CT revealed interval resolution of bilateral hypermetabolic cervical and posterior nasopharyngeal adenopathy.  No evidence for residual or recurrent metabolically active tumor.  - 08/2018:  Maintenance treatment with rituximab was discussed by the primary oncologist, however given her concerns with interstitial cystitis and  memory issues, shared decision was made to hold off on maintenance treatment and pursue watchful observation.  - 10/30/18:  CT neck revealed interval resolution of nasopharyngeal mass and cervical adenopathy bilaterally.  No residual mass or adenopathy.  - 06/03/19:  PET CT showed no evidence of recurrence of lymphoma.    Patient denies fevers, unintentional weight loss, night sweats, enlarged lymph nodes, abdominal  pain, n/v/d. She has ongoing issues with her ears for which she saw ENT on 04/15/2020, noted to have patulous eustachian tube. Patient has recently moved back to [place]. She lived in [place] for about 20 years. She is currently residing in a senior/assisted living.      REVIEW OF SYSTEMS:   All systems reviewed and are negative except for what is mentioned in HPI    Past Medical History:   Diagnosis Date    Cancer (CMS [place])     Hearing loss     Heart disease      Past Surgical History:   Procedure Laterality Date    HX TONSILLECTOMY      HX VENTILATORY TUBES      MASTECTOMY, PARTIAL       Social History     Socioeconomic History    Marital status: Unknown     Spouse name: Not on file    Number of children: Not on file    Years of education: Not on file    Highest education level: Not on file   Occupational History    Not on file   Tobacco Use    Smoking status: Never Smoker    Smokeless tobacco: Never Used   Vaping Use    Vaping Use: Never used   Substance and Sexual Activity    Alcohol use: Never    Drug use: Never    Sexual activity: Not on file   Other Topics Concern    Not on file   Social History Narrative    Not on file     Social Determinants of Health     Financial Resource Strain: Not  on file   Food Insecurity: Not on file   Transportation Needs: Not on file   Physical Activity: Not on file   Stress: Not on file   Intimate Partner Violence: Not on file     Family Medical History:       Problem Relation (Age of Onset)    Heart Disease Father    Thyroid Cancer Mother          PHYSICAL EXAM:  Vitals:    05/10/20 1135   BP: (!) 145/81   Pulse: 62   Resp: 18   Temp: 36.4 C (97.5 F)   SpO2: 100%   Weight: 76.2 kg (167 lb 15.9 oz)   Height: 1.579 m (5' 2.17")   BMI: 30.63   Body surface area is 1.83 meters squared.  Body mass index is 30.56 kg/m.    ECOG: (1) Restricted in physically strenuous activity, ambulatory and able to do work of light nature    General: well appearing elderly female in no  distress. Hard of hearing  Eyes: EOMI, anicteric sclera  HENT: Head NCAT. mucous membranes moist.  Neck: supple  Lungs: clear to auscultation bilaterally.   Cardiovascular: RRR, no m/r/g  Abdomen: soft, non-tender, no HSM  Extremities: no edema  Skin: Skin warm and dry  Lymphatics: no palpable lymphadenopathy  Neurologic: AAOx3, no focal deficits  Psychiatric: affect normal    LABS  Results for orders placed or performed during the hospital encounter of 05/10/20 (from the past 24 hour(s))   BLOOD CELL COUNT W/DIFF - CANCER CENTER    Narrative    The following orders were created for panel order BLOOD CELL COUNT W/DIFF - CANCER CENTER.  Procedure                               Abnormality         Status                     ---------                               -----------         ------                     CBC WITH DIFF[[id number]]                                    Final result               PLATELETS AND ANC CANCER.Marland KitchenMarland Kitchen[[id number]]  Normal              Final result                 Please view results for these tests on the individual orders.   LDH   Result Value Ref Range    LDH 182 125 - 220 U/L   COMPREHENSIVE METABOLIC PANEL, NON-FASTING   Result Value Ref Range    SODIUM 137 136 - 145 mmol/L    POTASSIUM 3.8 3.5 - 5.1 mmol/L    CHLORIDE 103 96 - 111 mmol/L    CO2 TOTAL 25 23 - 31 mmol/L    ANION GAP 9 4 - 13 mmol/L    BUN 17 8 - 25 mg/dL  CREATININE 1.03 0.60 - 1.05 mg/dL    BUN/CREA RATIO 17 6 - 22    ESTIMATED GFR      ALBUMIN 4.1 3.4 - 4.8 g/dL     CALCIUM 11.2 (H) 8.8 - 10.2 mg/dL    GLUCOSE 98 65 - 125 mg/dL    ALKALINE PHOSPHATASE 92 55 - 145 U/L    ALT (SGPT) 34 (H) 8 - 22 U/L    AST (SGOT)  27 8 - 45 U/L    BILIRUBIN TOTAL 2.0 (H) 0.3 - 1.3 mg/dL    PROTEIN TOTAL 7.3 6.0 - 8.0 g/dL   CBC WITH DIFF   Result Value Ref Range    WBC 6.7 3.7 - 11.0 x103/uL    RBC 5.16 3.85 - 5.22 x106/uL    HGB 15.0 11.5 - 16.0 g/dL    HCT 44.7 34.8 - 46.0 %    MCV 86.6 78.0 - 100.0 fL    MCH 29.1 26.0 - 32.0 pg    MCHC  33.6 31.0 - 35.5 g/dL    RDW-CV 12.9 11.5 - 15.5 %    PLATELETS 188 150 - 400 x103/uL    MPV 10.4 8.7 - 12.5 fL    NEUTROPHIL % 61 %    LYMPHOCYTE % 27 %    MONOCYTE % 9 %    EOSINOPHIL % 2 %    BASOPHIL % 1 %    NEUTROPHIL # 4.09 1.50 - 7.70 x103/uL    LYMPHOCYTE # 1.83 1.00 - 4.80 x103/uL    MONOCYTE # 0.57 0.20 - 1.10 x103/uL    EOSINOPHIL # 0.15 <=0.50 x103/uL    BASOPHIL # <0.10 <=0.20 x103/uL    IMMATURE GRANULOCYTE % 0 0 - 1 %    IMMATURE GRANULOCYTE # <0.10 <0.10 x103/uL   PLATELETS AND ANC CANCER CENTER   Result Value Ref Range    NEUTROPHILS # 4.09 1.50 - 7.70 x103/uL    PLATELET COUNT 188 150 - 400 x103/uL     IMAGING:  Reviewed in chart    PATHOLOGY:  D.  LYMPH NODE, RIGHT NECK, BIOPSY (OSS, [id number], 14 slides, [facility]; obtained 32/67/1245):    - Follicular lymphoma, grade 3a, follicular pattern.  (See comment)     E.  NASOPHARYNX, BIOPSY (OSS, [id number], 14 slides, [facility]; obtained 80/99/8338):    - Follicular lymphoma, grade 3a, follicular pattern.      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     1. Follicular lymphoma (grade 3a)  - Diagnosed 03/2018 on right neck lymph node and nasopharyngeal biopsies  - s/p 4 cycles of Rituximab, 3 cycles including dose reduced dose Bendamustine.  - End of treatment imaging showed complete response.  - Maintenance treatment with rituximab was discussed by the primary oncologist, however given her concerns with interstitial cystitis and  memory issues, shared decision was made to hold off on maintenance Rituximab and pursue watchful observation.   - Last PET-CT was performed in 05/2019 which showed no evidence of recurrence  - Today we discussed a normal healthy blood system and the derangements that occur in  follicular lymphoma.   - Discussed the excellent response she had to the treatment and plan to continue surveillance. Will order a CT neck prior to next visit in three  months.   - Reassured the patient that her ear issues are not secondary to lymphoma.    2.Patulous eustachian tube, history of b/l tympanostomy tube placement:  - following with ENT. Next appointment is on 05/20/20    3. Chronic interstitial cystitis:  - On Elmiron  - following with urology    RTC in three months with CT neck prior    Dennison Mascot, MD  PGY-V, Hematology/Oncology fellow  [facility]        05/14/2020  I saw and examined the patient.  I reviewed the fellow's note.  I agree with the findings and plan of care as documented in the fellow's note.  Any exceptions/additions are edited/noted.  This was a 60 minute visit, more than half of which was spent in counseling on follicular lymphoma.      Katheren Puller, MD

## 2020-05-11 NOTE — Progress Notes (Signed)
Late entry for 05/10/20:    MSW met with pt in exam room, pt is new pt seeing Dr. Harrington Challenger; MSW educated pt on different services provided by an oncology social worker.  MSW discussed grant opportunities available through [employer], when open, if needed. MSW also educated pt on the [employer] and the different types of temporary assistance that can be provided through this fund. MSW provided pt with a handout that outlines assistance MSW can provide and contact information. MSW encouraged pt to call with any questions or concerns. No additional questions at this time.   MSW to continue to follow    Kirke Corin, MSW  05/11/2020, 09:29

## 2020-05-12 ENCOUNTER — Ambulatory Visit (HOSPITAL_BASED_OUTPATIENT_CLINIC_OR_DEPARTMENT_OTHER): Payer: Self-pay | Admitting: NURSE PRACTITIONER

## 2020-05-12 NOTE — Telephone Encounter (Addendum)
Returned call to patient. Reviewed labs from 05/10/20 visit. Patient verbalized understanding. Patient to increase PO fluid intake and hold calcium supplements at this time. Reviewed upcoming appointments and times. Patient verbalized no further questions or concerns.     Wylie Hail, APRN  Section of Hematology/Oncology    [place] Department of Medicine         ----- Message from [facility] sent at 05/11/2020 10:05 AM EST -----  Harrington Challenger pt  Pt is requesting lab results, she also states you advised her she has an appt at the end of the week. She is not sure with who

## 2020-05-14 ENCOUNTER — Other Ambulatory Visit (HOSPITAL_BASED_OUTPATIENT_CLINIC_OR_DEPARTMENT_OTHER): Payer: Self-pay | Admitting: Internal Medicine

## 2020-05-14 NOTE — Progress Notes (Signed)
MULTIDISCIPLINARY MLS TUMOR BOARD DISCUSSION:    PATIENT:  Connie Pineda  MRN:  [id number]  DOB:   Sep 16, 1940  AGE:   79 y.o.  DATE:  05/14/2020    REFERRING PROVIDER: No ref. provider found  PCP: Pcp Not In System    PRESENTER: Katheren Puller, MD  TYPE OF PRESENTATION: Prospective  PATHOLOGY REVIEWED AT [facility]?: Yes  RADIOLOGY REVIEWED AT [facility]?:  Yes  NATIONAL GUIDELINES DISCUSSED?: Yes; NCCN    DIAGNOSIS: Follicular lymphoma; Hx of left breast DCIS and BCC/SCC of skin  DIAGNOSIS DATE: October 2019  DIAGNOSIS LOCATION:  Outside Facility  DIAGNOSIS METHOD: Biopsy  HISTOLOGY: By provided immunohistochemistry, the neoplastic nodules are positive for CD20, bcl-2 and bcl-6, while negative for CD5 and CD10. CD21 and CD23 highlight the meshwork corresponding to the neoplastic nodules; again, no definitive areas of diffuse growth are seen. Ki-67 highlights increased numbers of neoplastic cells within the nodules (at least 50%) and further demonstrates lack of polarization. By report, flow cytometric analysis detected a kappa-restricted B-cell population without co-expression of CD5 or CD10.  RECURRENT/ RELAPSE/ REFRACTORY: No    FAMILY HISTORY?: Not Significant; Mother w/ thyroid cancer  GENETIC TESTING?: No  PROGNOSTIC INDICATORS: Yes    INTERVENTIONS:   SURGICAL/PROCEDURES: Biopsy of right cervical lymph node and right nasopharyngeal biopsy performed October 2019 at an outside facility.   CHEMOTHERAPY/IMMUNOTHERAPY: Received one cycle of single agent Rituxanin November 2019 followed by [facility] of combined bendamustine and rituximab completed in February 2020.    CLINICAL TRIAL PARTICIPATION: No  ELIGIBLE CLINICAL TRIALS: No    NEED FOR PALLIATIVE CARE?:  No  PSYCHOSOCIAL CONCERNS?:   No  REHABILITATION CONCERNS?:   No  NUTRITIONAL CONCERNS?: No  DENTAL/ORAL SURGERY?: No    PATIENT NAVIGATOR DISCUSSED OUTREACH ACTIVITIES/PROGRAMS?: Yes  PATIENT NAVIGATOR PROVIDED EDUCATIONAL MATERIAL TO PATIENT?: Yes    THE PATIENT'S MOST RECENT  CLINICAL INFORMATION, IMAGING, AND PATHOLOGY WAS DISCUSSED TODAY AT [place] MULTIDISCIPLINARY MLS TUMOR BOARD BY THE MEDICAL ONCOLOGY, RADIATION ONCOLOGY, PATHOLOGY, PHARMACY, DATA, SOCIAL SERVICES, GENETIC, REHAB, AND NURSING SERVICES.  RECOMMENDATIONS BASED ON TODAY'S TUMOR BOARD:   -Observation  -CT neck in 3 months    York Pellant 05/14/2020 13:50  Oncology Tumor Board Coordinator

## 2020-05-20 ENCOUNTER — Ambulatory Visit (HOSPITAL_BASED_OUTPATIENT_CLINIC_OR_DEPARTMENT_OTHER): Payer: Medicare Other | Admitting: Audiologist

## 2020-05-20 ENCOUNTER — Encounter (INDEPENDENT_AMBULATORY_CARE_PROVIDER_SITE_OTHER): Payer: Self-pay | Admitting: Otolaryngology

## 2020-05-20 ENCOUNTER — Other Ambulatory Visit: Payer: Self-pay

## 2020-05-20 ENCOUNTER — Ambulatory Visit: Payer: Medicare Other | Attending: Otolaryngology | Admitting: Otolaryngology

## 2020-05-20 VITALS — BP 122/88 | HR 68 | Temp 98.4°F | Ht 63.0 in | Wt 168.4 lb

## 2020-05-20 DIAGNOSIS — H903 Sensorineural hearing loss, bilateral: Secondary | ICD-10-CM | POA: Insufficient documentation

## 2020-05-20 DIAGNOSIS — H9072 Mixed conductive and sensorineural hearing loss, unilateral, left ear, with unrestricted hearing on the contralateral side: Secondary | ICD-10-CM | POA: Insufficient documentation

## 2020-05-20 NOTE — Progress Notes (Signed)
NAME:  Connie Pineda  MRN:  [id number]  DOB:  05-26-1941  DOS:  05/20/2020    Subjective  Connie Pineda is a 79 y.o. year old female with a hx of mastectomy, interstitial cystitis, and non-hodgkin's lymphoma, presenting to clinic for evaluation of aural fullness. She was last seen by Dr. Adelene Idler on 04/14/2020. At that time she reported that the aural fullness and autophony had been present for ~3 years. She reported feeling pressure within her ears and hearing an noticeable echo when she spoke. She had seen multiple ENT specialists for this issue who believed she might be presenting with patulous eustachian tube. She endorsed weight gain. She reported bilateral VT's in the past which failed to alleviate her symptoms. Today patient reports after talking for a few minutes, she will gradually feel aural fullness progressing. She states this also occurs when she is eating or outside. She denies this aural fullness ever being painful. She states she hears her own voice as if she is in a "tunnel". She also endorses intermittent pulsatile tinnitus bilaterally and loss of her sense of taste. She reports a MHx of non-Hodgkin's lymphoma around her ears and neck, breast cancer, and skin cancer. She also notes a MHx of pelvic spasms, for which she takes Klonopin. She reports that when she takes a dose of the Klonopin, this also provides relief for her ear symptoms. She states that these symptoms began prior to undergoing chemotherapy. She has a SHx of PE tubes, with bilateral ones currently in place. She denies ear infections, migraines, or vertigo.     No Known Allergies  Current Outpatient Medications   Medication Sig    amLODIPine (NORVASC) 2.5 mg Oral Tablet Take 2.5 mg by mouth    ascorbic acid (VITAMIN C ORAL) Take by mouth    aspirin (ECOTRIN) 81 mg Oral Tablet, Delayed Release (E.C.) Take 81 mg by mouth    atorvastatin (LIPITOR) 20 mg Oral Tablet Take 20 mg by mouth    calcium carbonate/vitamin D3 (VITAMIN D-3 ORAL) Take by mouth     cholecalciferol, vitamin D3, 25 mcg (1,000 unit) Oral Tablet Take 1,000 Units by mouth Once a day    clonazePAM (KLONOPIN) 1 mg Oral Tablet TAKE 1 TABLET BY MOUTH TWICE DAILY AS NEEDED FOR ANXIETY( HIGH TONE PELVIC FLOOR DYSFUNCTION)    ELMIRON 100 mg Oral Capsule TAKE 1 CAPSULE BY MOUTH THREE TIMES DAILY BEFORE MEALS ON AN EMPTY STOMACH    HERBAL DRUGS ORAL Take by mouth Once a day Quercetin    metoprolol succinate (TOPROL-XL) 25 mg Oral Tablet Sustained Release 24 hr Take 25 mg by mouth    zinc Sulfate (ZINCATE) 50 mg zinc (220 mg) Oral Tablet Take 50 mg by mouth Once a day     Objective  Blood pressure 122/88, pulse 68, temperature 36.9 C (98.4 F), height 1.6 m (5\' 3" ), weight 76.4 kg (168 lb 6.9 oz).  Well appearing and in no apparent distress    Ears:   Right:   Pinnae: Normal shape and position.   External auditory canals:  Patent without inflammation.  Tympanic membranes:  Intact, translucent, midposition, middle ear aerated. T-tube in place and patent.    Left:   Pinnae: Normal shape and position.   External auditory canals:  Patent without inflammation.   Tympanic membranes:  Intact, translucent, midposition, middle ear aerated. T-tube in place and patent. Small crusting superior to the tube.    Binocular microscopy necessary on exam today for diagnostic and/or treatment  purposes; see dictated note.  Brita Romp, SCRIBE 05/20/2020, 11:57     Data Reviewed:  Audiogram was performed 05/20/2020. SRT on the right was 15 dB, and SRT on the left was 15 dB. Word recognition on the right was 96% at 55 dB, and word recognition on the left was 96% at 55 dB.   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    Assessment:  Encounter Diagnosis   Name Primary?    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 I ordered a CT scan for further  evaluation.  2. I also ordered a VEMP for further evaluation.  3. RTC following testing.     I am scribing for, and in the presence of, Dr. Roslyn Smiling for services provided on 05/20/2020.  Brita Romp, SCRIBE     I personally performed the services described in this documentation, as scribed  in my presence, and it is both accurate  and complete.    [name], MD  [place] Department of Otolaryngology    PCP: Pcp Not In System  No address on file  REF: Self, Referral  No address on file

## 2020-05-20 NOTE — Progress Notes (Signed)
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  Left Ear: Speech understanding at conversational levels was  96% in quiet.   Recommendations: ENT evaluation/follow-up  Retest as needed, Amplification if interested pending medical clearance

## 2020-05-31 ENCOUNTER — Other Ambulatory Visit (INDEPENDENT_AMBULATORY_CARE_PROVIDER_SITE_OTHER): Payer: Self-pay | Admitting: Urology

## 2020-05-31 ENCOUNTER — Encounter (INDEPENDENT_AMBULATORY_CARE_PROVIDER_SITE_OTHER): Payer: Self-pay | Admitting: Urology

## 2020-05-31 ENCOUNTER — Ambulatory Visit: Payer: Medicare Other | Attending: Urology | Admitting: Urology

## 2020-05-31 DIAGNOSIS — N200 Calculus of kidney: Secondary | ICD-10-CM | POA: Insufficient documentation

## 2020-05-31 MED ORDER — CLONAZEPAM 1 MG TABLET
1.0000 mg | ORAL_TABLET | Freq: Two times a day (BID) | ORAL | 4 refills | Status: AC
Start: 2020-05-31 — End: ?

## 2020-05-31 MED ORDER — ELMIRON 100 MG CAPSULE
ORAL_CAPSULE | ORAL | 3 refills | Status: DC
Start: 2020-05-31 — End: 2020-11-23

## 2020-05-31 NOTE — Progress Notes (Signed)
UROLOGY, PHYSICIAN OFFICE CENTER  [place] [address]  Operated by [place]  Telephone Visit    Name:  Connie Pineda MRN: [id number]   Date:  05/31/2020 Age:   79 y.o.     The patient/family initiated a request for telephone service.  Verbal consent for this service was obtained from the patient/family.    Last office visit in this department: Visit date not found        No diagnosis found.    Total provider time spent: 32 minutes.    Dossie Arbour, APRN,FNP-BC      HPI:  This is a 79 year old female participating in a telemedicine visit to establish urologic care.  She has a long history of urologic issues.  Prior to the telephone encounter I reviewed greater than 300 pages of outside urologic records.  She has long-standing interstitial cystitis.  She was told that her bladder is 1/3 the size of a normal bladder and her bladder is covered with mast cells.  She has been managed with Elmiron for number of years.  She has received some intravesical instillations and underwent cysto with hydrodistention at least once.  I see no other prior urologic treatment for her interstitial cystitis.  Her symptoms her currently reasonably well controlled with Elmiron 100 mg 3 times a day and Klonopin as needed for anxiety and pelvic floor relaxation.  She is very sensitive to dietary irritants and follows a strict diet.  She says that she can only drink water if all the minerals have been filtered out.  She also has a history of recurrent urinary tract infections.  She has had no recent symptomatic infections.  She also has a history of significant urinary stone disease.  She is status post multiple procedures including bilateral PCNL about 6-7 years ago.  Her last imaging was a renal ultrasound showing small nonobstructing stones.  Overall she is reasonably satisfied with her current status as long as she is able to stay on her current medications.    Past Medical History:   Diagnosis Date     Cancer (CMS [place])     Hearing loss     Heart disease            Past Surgical History:   Procedure Laterality Date    HX TONSILLECTOMY      HX VENTILATORY TUBES      MASTECTOMY, PARTIAL             Current Outpatient Medications   Medication Sig    amLODIPine (NORVASC) 2.5 mg Oral Tablet Take 2.5 mg by mouth    ascorbic acid (VITAMIN C ORAL) Take by mouth    aspirin (ECOTRIN) 81 mg Oral Tablet, Delayed Release (E.C.) Take 81 mg by mouth    atorvastatin (LIPITOR) 20 mg Oral Tablet Take 20 mg by mouth    calcium carbonate/vitamin D3 (VITAMIN D-3 ORAL) Take by mouth    cholecalciferol, vitamin D3, 25 mcg (1,000 unit) Oral Tablet Take 1,000 Units by mouth Once a day    clonazePAM (KLONOPIN) 1 mg Oral Tablet TAKE 1 TABLET BY MOUTH TWICE DAILY AS NEEDED FOR ANXIETY( HIGH TONE PELVIC FLOOR DYSFUNCTION)    ELMIRON 100 mg Oral Capsule TAKE 1 CAPSULE BY MOUTH THREE TIMES DAILY BEFORE MEALS ON AN EMPTY STOMACH    HERBAL DRUGS ORAL Take by mouth Once a day Quercetin    metoprolol succinate (TOPROL-XL) 25 mg Oral Tablet Sustained Release 24 hr Take 25  mg by mouth    zinc Sulfate (ZINCATE) 50 mg zinc (220 mg) Oral Tablet Take 50 mg by mouth Once a day       No Known Allergies    Social History     Socioeconomic History    Marital status: Unknown     Spouse name: Not on file    Number of children: Not on file    Years of education: Not on file    Highest education level: Not on file   Occupational History    Not on file   Tobacco Use    Smoking status: Never Smoker    Smokeless tobacco: Never Used   Vaping Use    Vaping Use: Never used   Substance and Sexual Activity    Alcohol use: Never    Drug use: Never    Sexual activity: Not on file   Other Topics Concern    Not on file   Social History Narrative    Not on file     Social Determinants of Health     Financial Resource Strain: Not on file   Food Insecurity: Not on file   Transportation Needs: Not on file   Physical Activity: Not on file    Stress: Not on file   Intimate Partner Violence: Not on file       Family Medical History:     Problem Relation (Age of Onset)    Heart Disease Father    Thyroid Cancer Mother            ROS:  Constitutional- Denies any weight loss/gain, fatigue, fever or chills  Cardiovascular: Denies any chest pain upon exertion, palpations, angina  Respiratory: Denies any cough, infection, sputum, wheezing  Gastrointestinal: Denies any bloating, nausea, vomiting, diarrhea  Genitourinary:  Please see HPI  Psychiatric: Denies any depression, anxiety, mood changes    ASSESSMENT:   1. Longstanding interstitial cystitis  2. 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  3. Recurrent urinary tract infections    PLAN:    Follow up:  We will manage her Elmiron and Klonopin moving forward.  We will send in refills for these.  I have recommended checking a kidney stone protocol CT scan to determine her stone status.  She will continue with her present management.  We will plan on seeing her back in 6 months.   The patient was advised to present to our emergency department if any significant issues arise.  There is a urology resident on call and attending urologist on call to be consulted as necessary.   Shaquitta Burbridge was also advised to call our office if any other pressing issues arise.  The phone number is [phone].  The appointment line is available from 8:00 AM until 4:00 PM Monday through Friday.  The office is closed on weekends however the Medical Access Referral Line Nurses are always available at [phone].      I personally offered the service to the patient, and obtained verbal consent to provide this service.    Dossie Arbour, APRN,FNP-BC

## 2020-06-07 ENCOUNTER — Ambulatory Visit (INDEPENDENT_AMBULATORY_CARE_PROVIDER_SITE_OTHER): Payer: Self-pay | Admitting: Otolaryngology

## 2020-06-07 NOTE — Telephone Encounter (Signed)
Regarding: Connie Pineda  ----- Message from [place] sent at 06/07/2020  9:51 AM EST -----  Patient is scheduled for a CT scan, ABR & see Dr. Roslyn Smiling on 07/08/20.  Patient is concerned about her appointment with Dr. Roslyn Smiling being so late because she comes from the [facility] in a [place] & they quit giving rides at 3 pm.  She would have to be back there by 3 pm on 07/08/20.   Patient is concerned she never received a call to schedule these.  Patient would like appointment letters for these also.  Patient can be reached at [phone].  Thank you

## 2020-06-07 NOTE — Telephone Encounter (Signed)
Routed message to Dr. Roslyn Smiling. Dr. Roslyn Smiling will be out of office until next week. Called pt, no answer. Phone rang for several rings. Unable to leave voicemail.

## 2020-06-14 ENCOUNTER — Ambulatory Visit (INDEPENDENT_AMBULATORY_CARE_PROVIDER_SITE_OTHER): Payer: Self-pay | Admitting: Otolaryngology

## 2020-06-14 NOTE — Telephone Encounter (Signed)
Patient called back; i advised that the next available appt for Dr Roslyn Smiling would be late February; she decided to keep her current appt as scheduled

## 2020-06-14 NOTE — Telephone Encounter (Signed)
Returned call to patient; no answer to call; unable to leave message, no VM

## 2020-06-14 NOTE — Telephone Encounter (Signed)
Regarding: Kellermeyer  ----- Message from Yehuda Savannah sent at 06/14/2020  3:00 PM EST -----  Patient was calling to speak with someone about her upcoming Dr. Roslyn Smiling appt and it potentially being bumped up sooner in the day. She said her transportation  Wont pick her up past 3. She can be reached @ [phone], thank you.

## 2020-06-15 ENCOUNTER — Other Ambulatory Visit (HOSPITAL_COMMUNITY): Payer: Self-pay

## 2020-06-16 ENCOUNTER — Other Ambulatory Visit: Payer: Self-pay

## 2020-06-16 ENCOUNTER — Ambulatory Visit
Admission: RE | Admit: 2020-06-16 | Discharge: 2020-06-16 | Disposition: A | Discharge status: Home / Self Care | Payer: Medicare Other | Source: Ambulatory Visit | Attending: Specialist | Admitting: Specialist

## 2020-06-16 DIAGNOSIS — I700 Atherosclerosis of aorta: Secondary | ICD-10-CM

## 2020-06-16 DIAGNOSIS — N200 Calculus of kidney: Secondary | ICD-10-CM

## 2020-06-17 ENCOUNTER — Ambulatory Visit: Payer: Medicare Other | Admitting: Hematology

## 2020-06-17 ENCOUNTER — Other Ambulatory Visit: Payer: Medicare Other

## 2020-06-21 ENCOUNTER — Ambulatory Visit: Payer: Medicare Other | Admitting: Neurology

## 2020-07-08 ENCOUNTER — Other Ambulatory Visit: Payer: Self-pay

## 2020-07-08 ENCOUNTER — Ambulatory Visit (INDEPENDENT_AMBULATORY_CARE_PROVIDER_SITE_OTHER): Payer: Medicare Other | Admitting: Audiologist

## 2020-07-08 ENCOUNTER — Ambulatory Visit (HOSPITAL_BASED_OUTPATIENT_CLINIC_OR_DEPARTMENT_OTHER): Payer: Medicare Other | Admitting: Otolaryngology

## 2020-07-08 ENCOUNTER — Ambulatory Visit
Admission: RE | Admit: 2020-07-08 | Discharge: 2020-07-08 | Disposition: A | Discharge status: Home / Self Care | Payer: Medicare Other | Source: Ambulatory Visit | Attending: Otolaryngology | Admitting: Otolaryngology

## 2020-07-08 VITALS — Temp 97.4°F | Ht 63.0 in | Wt 170.0 lb

## 2020-07-08 DIAGNOSIS — H903 Sensorineural hearing loss, bilateral: Secondary | ICD-10-CM

## 2020-07-08 DIAGNOSIS — H6900 Patulous Eustachian tube, unspecified ear: Secondary | ICD-10-CM | POA: Insufficient documentation

## 2020-07-08 DIAGNOSIS — R42 Dizziness and giddiness: Secondary | ICD-10-CM

## 2020-07-08 DIAGNOSIS — H9072 Mixed conductive and sensorineural hearing loss, unilateral, left ear, with unrestricted hearing on the contralateral side: Secondary | ICD-10-CM | POA: Insufficient documentation

## 2020-07-08 DIAGNOSIS — Z9622 Myringotomy tube(s) status: Secondary | ICD-10-CM

## 2020-07-08 NOTE — Progress Notes (Signed)
Patient seen today for Cervical Vestibular Evoked Myogenic Potential (cVEMP) testing.     Hx: Connie Pineda reports feeling dizzy for approximately the last 6-7 years. She also reports history of tubes, bilaterally. Aside from the dizziness [name] reports pulsating tinnitus bilaterally and a feeling of fullness that she feels is associated with temperature changes.     Imp: cVEMP waveforms were present and repeatable bilaterally in response to a 105 dB 500 Hz toneburst stimuli. cVEMP threshold was elevated in the right ear and normal for the left ear. Interaural latency difference was normal. Interamplitude difference was 54% which is abnormal.     Rec: Today's cVEMP is not suggestive of a superior canal dehiscence in either either. Abnormalities with interamplitude difference could be attributed to a problem with the inferior vestibular nerve and/or saccule of the right ear. It should be noted that patient stated that she felt she was able to better turn her head/neck to the left vs right which could also potentially cause a difference in cVEMP amplitude.

## 2020-07-08 NOTE — Procedures (Signed)
ENT, [place]  [place] [address]  Operated by [facility]  Procedure Note    Name: Connie Pineda MRN:  [id number]   Date: 07/08/2020 Age: 80 y.o.       Binocular Microscopy    Date/Time: 07/08/2020 7:01 PM  Performed by: Evalee Jefferson, DO  Authorized by: [name], MD     Comments:      Binocular microscopy necessary on exam today for diagnostic and/or treatment purposes for the ears; See Clinic Physical Exam          Evalee Jefferson, DO      I was present and supervised/observed the entire procedure.  [name], MD

## 2020-07-08 NOTE — Progress Notes (Signed)
NAME:  Cheyanne Lamison  MRN:  [id number]  DOB:  10-Jan-1941  DOS:  07/08/2020    Subjective  Kalya Troeger is a 80 y.o. year old female with a hx of mastectomy, interstitial cystitis, and non-hodgkin's lymphoma, presenting to clinic for follow up of aural fullness. Last clinic visit was on 05/20/2020 where symptoms were concerning for patulous eustachian vs SSCD. A cVEMP and CT IAMS was completed prior to today's visit. Patient still reporting occasional aural fullness, autophony, and pulsatile tinnitus. She states she hears her own voice as if she is in a "tunnel". These symptoms have occurred with walking outside, getting on an elevator, and changes in weather patterns. She had bilateral VT's placed at outside facility about 2 years ago which have not helped alleviate her symptoms. Patient was prescribed Klonopin for interstitial cystitis and notes that this is the only medication that seems to quell her symptoms.      No Known Allergies  Current Outpatient Medications   Medication Sig    amLODIPine (NORVASC) 2.5 mg Oral Tablet Take 2.5 mg by mouth    ascorbic acid (VITAMIN C ORAL) Take by mouth    aspirin (ECOTRIN) 81 mg Oral Tablet, Delayed Release (E.C.) Take 81 mg by mouth    atorvastatin (LIPITOR) 20 mg Oral Tablet Take 20 mg by mouth    calcium carbonate/vitamin D3 (VITAMIN D-3 ORAL) Take by mouth    cholecalciferol, vitamin D3, 25 mcg (1,000 unit) Oral Tablet Take 1,000 Units by mouth Once a day    clonazePAM (KLONOPIN) 1 mg Oral Tablet TAKE 1 TABLET BY MOUTH TWICE DAILY AS NEEDED FOR ANXIETY( HIGH TONE PELVIC FLOOR DYSFUNCTION)    clonazePAM (KLONOPIN) 1 mg Oral Tablet Take 1 Tablet (1 mg total) by mouth Twice daily    ELMIRON 100 mg Oral Capsule TAKE 1 CAPSULE BY MOUTH THREE TIMES DAILY BEFORE MEALS ON AN EMPTY STOMACH    HERBAL DRUGS ORAL Take by mouth Once a day Quercetin    metoprolol succinate (TOPROL-XL) 25 mg Oral Tablet Sustained Release 24 hr Take 25 mg by mouth    zinc Sulfate (ZINCATE) 50 mg zinc (220 mg) Oral  Tablet Take 50 mg by mouth Once a day     Objective  Temperature 36.3 C (97.4 F), temperature source Thermal Scan, height 1.6 m (5\' 3" ), weight 77.1 kg (169 lb 15.6 oz).  Well appearing and in no apparent distress    Ears:   Right:   Pinnae: Normal shape and position.   External auditory canals:  Patent without inflammation.  Tympanic membranes:  Intact, translucent, midposition, middle ear aerated. T-tube in place however surrounded by waxy debris. Patent    Left:   Pinnae: Normal shape and position.   External auditory canals:  Patent without inflammation.   Tympanic membranes:  Intact, translucent, midposition, middle ear aerated. T-tube in place and patent. Small crusting superior to the tube.    ENT, [place]  [place] [address]  Operated by [facility]  Procedure Note    Name: Triniti Gruetzmacher MRN:  [id number]   Date: 07/08/2020 Age: 80 y.o.       Binocular Microscopy    Date/Time: 07/08/2020 7:01 PM  Performed by: Evalee Jefferson, DO  Authorized by: [name], MD     Comments:      Binocular microscopy necessary on exam today for diagnostic and/or treatment purposes for the ears; See Clinic Physical Exam  Evalee Jefferson, DO        Data Reviewed:  CT IAMS 07/08/2020: bilateral tympanostomy tubes in place. Air visualized within the expected course of the ET's bilaterally suggesting possible patulous ET's. No evidence of SSCD    cVEMP 07/08/2020: cVEMP waveforms were present and repeatable bilaterally in response to a 105 dB 500 Hz toneburst stimuli. cVEMP threshold was elevated in the right ear and normal for the left ear. Interaural latency difference was normal. Interamplitude difference was 54% which is abnormal. cVEMP is not suggestive of a superior canal dehiscence in either ear.    Audiogram was performed 05/20/2020. SRT on the right was 15 dB, and SRT on the left was 15 dB. Word recognition on the right was 96% at 55 dB, and word recognition on  the left was 96% at 55 dB.   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    Assessment:  Encounter Diagnoses   Name Primary?    Patulous Eustachian tube, unspecified laterality Yes    S/P tympanostomy tube placement    Patient with persistent symptoms of aural fullness, autophony, and occasional pulsatile tinnitus. CT IAMS and cVEMP do not support the diagnosis of SSCD. Symptoms suggest patulous eustachian tubes however patient does not find relief with BVTs in place. Discussed that there are surgical procedures that can done to help treat patulous ET dysfunction however patient is not wanting a referral at this time. Discussed that we may consider removing her BVT's in 2-3 years time if they do not extrude on their own as they are not providing any benefit to the patient    Plan:  1. Follow up in clinic in 6 months for reevaluation or sooner with new or worsening symptoms      I saw and examined the patient.  I reviewed the resident's note.  I agree with the findings and plan of care as documented in the resident's note.  Any exceptions/additions are edited/noted.    [name], MD        PCP: Elgie Collard, MD  [facility] [address]  [place]   [id number]  REF: Self, Referral  No address on file

## 2020-07-08 NOTE — Patient Instructions (Signed)
Patulous Eustachian Tube

## 2020-07-15 ENCOUNTER — Encounter (INDEPENDENT_AMBULATORY_CARE_PROVIDER_SITE_OTHER): Payer: Self-pay | Admitting: Otolaryngology

## 2020-07-21 ENCOUNTER — Other Ambulatory Visit (HOSPITAL_COMMUNITY): Payer: Self-pay

## 2020-07-21 ENCOUNTER — Encounter (INDEPENDENT_AMBULATORY_CARE_PROVIDER_SITE_OTHER): Payer: Self-pay

## 2020-08-09 ENCOUNTER — Other Ambulatory Visit: Payer: Self-pay

## 2020-08-09 ENCOUNTER — Ambulatory Visit (INDEPENDENT_AMBULATORY_CARE_PROVIDER_SITE_OTHER): Payer: Self-pay | Admitting: Otolaryngology

## 2020-08-09 ENCOUNTER — Ambulatory Visit: Payer: Medicare Other | Attending: Internal Medicine | Admitting: Internal Medicine

## 2020-08-09 ENCOUNTER — Ambulatory Visit (HOSPITAL_BASED_OUTPATIENT_CLINIC_OR_DEPARTMENT_OTHER): Payer: Medicare Other

## 2020-08-09 ENCOUNTER — Ambulatory Visit (HOSPITAL_BASED_OUTPATIENT_CLINIC_OR_DEPARTMENT_OTHER)
Admission: RE | Admit: 2020-08-09 | Discharge: 2020-08-09 | Disposition: A | Discharge status: Home / Self Care | Payer: Medicare Other | Source: Ambulatory Visit | Attending: Internal Medicine | Admitting: Internal Medicine

## 2020-08-09 VITALS — BP 130/54 | HR 79 | Temp 97.6°F | Resp 18 | Ht 63.0 in | Wt 167.3 lb

## 2020-08-09 DIAGNOSIS — Z08 Encounter for follow-up examination after completed treatment for malignant neoplasm: Secondary | ICD-10-CM | POA: Insufficient documentation

## 2020-08-09 DIAGNOSIS — Z86000 Personal history of in-situ neoplasm of breast: Secondary | ICD-10-CM | POA: Insufficient documentation

## 2020-08-09 DIAGNOSIS — Z8572 Personal history of non-Hodgkin lymphomas: Secondary | ICD-10-CM

## 2020-08-09 DIAGNOSIS — Z9012 Acquired absence of left breast and nipple: Secondary | ICD-10-CM | POA: Insufficient documentation

## 2020-08-09 DIAGNOSIS — Z79899 Other long term (current) drug therapy: Secondary | ICD-10-CM | POA: Insufficient documentation

## 2020-08-09 DIAGNOSIS — H6900 Patulous Eustachian tube, unspecified ear: Secondary | ICD-10-CM | POA: Insufficient documentation

## 2020-08-09 DIAGNOSIS — Z808 Family history of malignant neoplasm of other organs or systems: Secondary | ICD-10-CM | POA: Insufficient documentation

## 2020-08-09 DIAGNOSIS — Z85828 Personal history of other malignant neoplasm of skin: Secondary | ICD-10-CM | POA: Insufficient documentation

## 2020-08-09 DIAGNOSIS — N3010 Interstitial cystitis (chronic) without hematuria: Secondary | ICD-10-CM | POA: Insufficient documentation

## 2020-08-09 DIAGNOSIS — Z9629 Presence of other otological and audiological implants: Secondary | ICD-10-CM | POA: Insufficient documentation

## 2020-08-09 LAB — CBC WITH DIFF
BASOPHIL #: <0.1 10*3/uL (ref <=0.20)
BASOPHIL %: 1 %
EOSINOPHIL #: 0.15 10*3/uL (ref <=0.50)
EOSINOPHIL %: 2 %
HCT: 45.3 % (ref 34.8–46.0)
HGB: 15.7 g/dL (ref 11.5–16.0)
IMMATURE GRANULOCYTE #: <0.1 10*3/uL (ref <0.10)
IMMATURE GRANULOCYTE %: 0 % (ref 0–1)
LYMPHOCYTE #: 1.89 10*3/uL (ref 1.00–4.80)
LYMPHOCYTE %: 28 %
MCH: 29.9 pg (ref 26.0–32.0)
MCHC: 34.7 g/dL (ref 31.0–35.5)
MCV: 86.3 fL (ref 78.0–100.0)
MONOCYTE #: 0.46 10*3/uL (ref 0.20–1.10)
MONOCYTE %: 7 %
MPV: 10.5 fL (ref 8.7–12.5)
NEUTROPHIL #: 4.19 10*3/uL (ref 1.50–7.70)
NEUTROPHIL %: 62 %
PLATELETS: 176 10*3/uL (ref 150–400)
RBC: 5.25 10*6/uL — ABNORMAL HIGH (ref 3.85–5.22)
RDW-CV: 12.7 % (ref 11.5–15.5)
WBC: 6.8 10*3/uL (ref 3.7–11.0)

## 2020-08-09 LAB — COMPREHENSIVE METABOLIC PANEL, NON-FASTING
ALBUMIN: 4.2 g/dL (ref 3.4–4.8)
ALKALINE PHOSPHATASE: 89 U/L (ref 55–145)
ALT (SGPT): 40 U/L — ABNORMAL HIGH (ref 8–22)
ANION GAP: 9 mmol/L (ref 4–13)
AST (SGOT): 30 U/L (ref 8–45)
BILIRUBIN TOTAL: 2.2 mg/dL — ABNORMAL HIGH (ref 0.3–1.3)
BUN/CREA RATIO: 16 (ref 6–22)
BUN: 14 mg/dL (ref 8–25)
CALCIUM: 10.8 mg/dL — ABNORMAL HIGH (ref 8.8–10.2)
CHLORIDE: 103 mmol/L (ref 96–111)
CO2 TOTAL: 26 mmol/L (ref 23–31)
CREATININE: 0.86 mg/dL (ref 0.60–1.05)
ESTIMATED GFR: 64 mL/min/BSA (ref >=60)
GLUCOSE: 104 mg/dL (ref 65–125)
POTASSIUM: 3.7 mmol/L (ref 3.5–5.1)
PROTEIN TOTAL: 7.2 g/dL (ref 6.0–8.0)
SODIUM: 138 mmol/L (ref 136–145)

## 2020-08-09 LAB — POC ISTAT CREATININE (RESULT): CREATININE, POC: 0.9 mg/dL (ref 0.49–1.10)

## 2020-08-09 LAB — PLATELETS AND ANC CANCER CENTER
NEUTROPHILS #: 4.19 10*3/uL (ref 1.50–7.70)
PLATELET COUNT: 176 10*3/uL (ref 150–400)

## 2020-08-09 LAB — LDH: LDH: 173 U/L (ref 125–220)

## 2020-08-09 MED ORDER — IOPAMIDOL 370 MG IODINE/ML (76 %) INTRAVENOUS SOLUTION
100.0000 mL | INTRAVENOUS | Status: AC
Start: 2020-08-09 — End: 2020-08-09
  Administered 2020-08-09: 13:00:00 100 mL via INTRAVENOUS

## 2020-08-09 NOTE — Telephone Encounter (Signed)
Returned call to patient and advised of number to call for [facility] and let her know that Dr Roslyn Smiling said any provider there would be able to assist her with the surgery she is needing completed; She verbalized understanding

## 2020-08-09 NOTE — Telephone Encounter (Signed)
Regarding: Kellermeyer  ----- Message from Yehuda Savannah sent at 08/05/2020  2:21 PM EST -----  Pt was hoping to speak with Staci to see what she found out talking with Dr. Roslyn Smiling regarding her sx? She can be reached @ [phone], thank you.

## 2020-08-10 NOTE — Cancer Center Note (Signed)
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     - 03/08/18:  CT neck revealed abnormal enlargement of nasopharyngeal soft tissues and pathological lymphadenopathy in the neck.   - 03/22/18: right cervical lymph node and nasopharyngeal biopsy confirmed follicular lymphoma, grade 3A.  - 04/17/18:  PET-CT revealed notable bilateral adenopathy in the neck and posterior nasopharynx, hypermetabolic consistent with malignancy.  No pathologically enlarged or significantly hypermetabolic adenopathy in the chest, abdomen or pelvis.  No splenomegaly.   - 11/19-2/21/20:  Status post cycle 1 of Rituxan alone, then 3 cycles of combined bendamustine and Rituximab completed by 08/02/18.  Bendamustine dose reduced to 70 milligrams/meter squared for interstitial cystitis.  - 08/22/18:  PET-CT revealed interval resolution of bilateral hypermetabolic cervical and posterior nasopharyngeal adenopathy.  No evidence for residual or recurrent metabolically active tumor.  - 08/2018:  Maintenance treatment with rituximab was discussed by the primary oncologist, however given her concerns with interstitial cystitis and  memory issues, shared decision was made to hold off on maintenance treatment and pursue watchful observation.  - 10/30/18:  CT neck revealed interval resolution of nasopharyngeal mass and cervical adenopathy bilaterally.  No residual mass or adenopathy.  - 06/03/19:  PET CT showed no evidence of recurrence of lymphoma.    Patient denies fevers, unintentional weight loss, night sweats, enlarged lymph nodes, abdominal  pain, n/v/d. She is still have issues with her ears, noted to have patulous eustachian tube. She says she will be referred to [facility] for further evaluation. She is currently residing in a senior/assisted living.      REVIEW OF SYSTEMS:   All systems reviewed and are negative except for what is mentioned in HPI    Past Medical History:   Diagnosis Date   . Cancer ([facility] [place])    . Hearing loss    . Heart disease      Past Surgical History:   Procedure Laterality Date   . HX TONSILLECTOMY     . HX VENTILATORY TUBES     . MASTECTOMY, PARTIAL       Social History     Socioeconomic History   . Marital status: Widowed     Spouse name: Not on file   . Number of children: Not on file   . Years of education: Not on file   . Highest education level: Not on file   Occupational History   . Not on file   Tobacco Use   . Smoking status: Never Smoker   . Smokeless tobacco: Never Used   Vaping Use   . Vaping Use: Never used   Substance and Sexual Activity   . Alcohol use: Never   . Drug use: Never   . Sexual activity: Not on file   Other Topics Concern   . Not on file   Social History Narrative   . Not on file     Social Determinants of Health     Financial Resource Strain: Not on file   Food Insecurity: Not on file  Transportation Needs: Not on file   Physical Activity: Not on file   Stress: Not on file   Intimate Partner Violence: Not on file   Housing Stability: Not on file     Family Medical History:     Problem Relation (Age of Onset)    Heart Disease Father    Thyroid Cancer Mother        PHYSICAL EXAM:  Vitals:    08/09/20 1417   BP: (!) 130/54   Pulse: 79   Resp: 18   Temp: 36.4 C (97.6 F)   SpO2: 96%   Weight: 75.9 kg (167 lb 5.3 oz)   Height: 1.6 m (5' 3")   BMI: 29.7   Body surface area is 1.84 meters squared.  Body mass index is 29.64 kg/m.    ECOG: (1) Restricted in physically strenuous activity, ambulatory and able to do work of light nature    General: well appearing elderly female in no distress. Hard of  hearing  Eyes: EOMI, anicteric sclera  HENT: Head NCAT. mucous membranes moist.  Neck: supple  Lungs: clear to auscultation bilaterally.   Cardiovascular: RRR, no m/r/g  Abdomen: soft, non-tender, no HSM  Extremities: no edema  Skin: Skin warm and dry  Lymphatics: no palpable lymphadenopathy  Neurologic: AAOx3, no focal deficits  Psychiatric: affect normal    LABS  Last CBC  (Last result in the past 2 years)      WBC   HGB   HCT   MCV   Platelets      08/09/20 1238 6.8   15.7   45.3   86.3   176          Last BMP  (Last result in the past 2 years)      Na   K   Cl   CO2   BUN   Cr   Calcium   Glucose   Glucose-Fasting        08/09/20 1238 138   3.7   103   26   14   0.86   10.8   104           Last Hepatic Panel  (Last result in the past 2 years)      Albumin   Total PTN   Total Bili   Direct Bili   Ast/SGOT   Alt/SGPT   Alk Phos        08/09/20 1238 4.2   7.2   2.2  Comment: Naproxen therapy can falsely elevate total bilirubin levels.     30   40   89             IMAGING:  CT neck 08/09/20 reviewed by me, no abnormal lymph nodes noted  IMPRESSION:  No suspicious lymph nodes by CT criteria within the neck.      PATHOLOGY:  D.  LYMPH NODE, RIGHT NECK, BIOPSY (OSS, [id number], 14 slides, [facility]; obtained 77/41/2878):    - Follicular lymphoma, grade 3a, follicular pattern.  (See comment)     E.  NASOPHARYNX, BIOPSY (OSS, [id number], 14 slides, [facility]; obtained 67/67/2094):    - Follicular lymphoma, grade 3a, follicular pattern.      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  1. Follicular lymphoma (grade 3a)  - Diagnosed 03/2018 on right neck lymph node and nasopharyngeal biopsies  - s/p 4 cycles of Rituximab, 3 cycles including dose reduced dose Bendamustine.  - End of  treatment imaging showed complete response.  - Maintenance treatment with rituximab was discussed by the primary oncologist, however given her concerns with interstitial cystitis and  memory issues, shared decision was made to hold off on maintenance Rituximab and pursue watchful observation.   - Last PET-CT was performed in 05/2019 which showed no evidence of recurrence  - Today we discussed a normal healthy blood system and the derangements that occur in follicular lymphoma.   - Discussed the excellent response she had to the treatment and plan to continue surveillance. CT neck without evidence of disease.   - Reassured the patient that her ear issues are not secondary to lymphoma.    2.Patulous eustachian tube, history of b/l tympanostomy tube placement:  - following with ENT. Says she will be referred to [facility].    3. Chronic interstitial cystitis:  - On Elmiron  - following with urology    RTC 3 months with HM1.    Katheren Puller, MD  08/10/2020, 13:50

## 2020-08-11 ENCOUNTER — Other Ambulatory Visit (HOSPITAL_BASED_OUTPATIENT_CLINIC_OR_DEPARTMENT_OTHER): Payer: Self-pay | Admitting: Internal Medicine

## 2020-08-11 DIAGNOSIS — Z8572 Personal history of non-Hodgkin lymphomas: Secondary | ICD-10-CM

## 2020-08-17 ENCOUNTER — Ambulatory Visit (INDEPENDENT_AMBULATORY_CARE_PROVIDER_SITE_OTHER): Payer: Self-pay

## 2020-08-17 NOTE — Telephone Encounter (Signed)
Regarding: DR. Roslyn Smiling   ----- Message from Clemon Chambers sent at 08/12/2020 10:27 AM EST -----  Patient calling to let Dr. Roslyn Smiling know she has an apt with Dr. Fayette Pho @ [facility] on 04.19.22, asking for Nurse Staci to fax patients records from apt with Dr. Roslyn Smiling to Dr. Melodye Ped @ [phone] //  If you have any questions, Please call patient .Connie Pineda  Thank You..    ----- Message from Yehuda Savannah sent at 08/05/2020  4:32 PM EST -----  Pt was returning call to [facility], thank you.     ----- Message from Yehuda Savannah sent at 08/05/2020  2:21 PM EST -----  Pt was hoping to speak with Staci to see what she found out talking with Dr. Roslyn Smiling regarding her sx? She can be reached @ [phone], thank you.

## 2020-08-17 NOTE — Telephone Encounter (Signed)
Faxed records as requested to number provided

## 2020-09-02 ENCOUNTER — Encounter (INDEPENDENT_AMBULATORY_CARE_PROVIDER_SITE_OTHER): Payer: Self-pay | Admitting: Otolaryngology

## 2020-09-21 ENCOUNTER — Ambulatory Visit (INDEPENDENT_AMBULATORY_CARE_PROVIDER_SITE_OTHER): Payer: Self-pay

## 2020-09-21 NOTE — Telephone Encounter (Signed)
Regarding: Dr. Roslyn Smiling  ----- Message from Namon Cirri sent at 09/20/2020  2:39 PM EDT -----  Please call [phone]  ----- Message from Myrtha Mantis sent at 09/20/2020 12:18 PM EDT -----  Dr. Roslyn Smiling  Patient called stating that Dr. Roslyn Smiling was referring her to Dr. Fayette Pho, she is asking if you have sent all of her records from Dr. Roslyn Smiling to Dr. Melodye Ped.  Please call her at [phone]

## 2020-09-21 NOTE — Telephone Encounter (Signed)
Returned call to patient and she advised that she spoke with someone in radiology and they were sending the images over

## 2020-11-19 DIAGNOSIS — I6521 Occlusion and stenosis of right carotid artery: Secondary | ICD-10-CM

## 2020-11-19 DIAGNOSIS — G459 Transient cerebral ischemic attack, unspecified: Secondary | ICD-10-CM

## 2020-11-23 ENCOUNTER — Ambulatory Visit (INDEPENDENT_AMBULATORY_CARE_PROVIDER_SITE_OTHER): Payer: Self-pay | Admitting: Urology

## 2020-11-23 ENCOUNTER — Other Ambulatory Visit (INDEPENDENT_AMBULATORY_CARE_PROVIDER_SITE_OTHER): Payer: Self-pay | Admitting: Urology

## 2020-11-23 MED ORDER — ELMIRON 100 MG CAPSULE
ORAL_CAPSULE | ORAL | 3 refills | Status: DC
Start: 2020-11-23 — End: 2021-06-24

## 2020-11-23 NOTE — Nursing Note (Signed)
Message from Flossie Buffy sent at 11/23/2020 3:48 PM EDT    [place] pt:   Pt is calling to see if she can get a refill of ELMIRON 100 mg Oral Capsule     She said she is desperate need of this.     Does she need the apt on 6/20 in order to get this refilled?     Please advise.     [place], [place]   [place] [address]   Phone: [phone] Fax: [phone]   Hours: Not open 24 hours     Thank you.               Call History     Type Contact Phone/Fax User   11/23/2020 03:47 PM EDT Phone (Incoming) Connie Pineda, Connie Pineda (Self) [phone] Lemmie Evens) [name], [name]         Pt last seen 05/31/20 by Dr Vena Rua. Pt has an upcoming appointment 11/29/20.   Per chart notes Dr Vena Rua agreeable to  Manage her Elmiron. RN prepped prescription and routed to Dr Vena Rua for review/approval.   RN updated call center that prescription was prepared for Dr Vena Rua to review/approve and to maintain 11/29/20 appointment.     Shayne Deerman, CLINICAL CARE COORDINATOR  11/23/2020, 15:58

## 2020-11-24 ENCOUNTER — Ambulatory Visit (INDEPENDENT_AMBULATORY_CARE_PROVIDER_SITE_OTHER): Payer: Self-pay | Admitting: Urology

## 2020-11-24 NOTE — Nursing Note (Signed)
Message  Received: Today  [place], Renato Battles, [place], CLINICAL CARE COORDINATOR  I let her know & she said she will be at apt on 6/20.      She wants to know if there is anything she can do about the price of it, she states they changed it to $3000 for a 3 month supply.     I told her I wasn't sure Dr. Vena Rua would be able to help with this, but she wanted me to ask.     Please advise.     Thank you.            Previous Messages      ----- Message -----   From: Juliette Mangle, CLINICAL CARE COORDINATOR   Sent: 11/23/2020  3:54 PM EDT   To: Flossie Buffy     We saw her in December so we are okay to refill now but she needs to maintain her appointment on 6/20 so we can continue to refill this.       ----- Message -----   From: Flossie Buffy   Sent: 11/23/2020  3:48 PM EDT   To: Urology [facility] Rn              Message from Flossie Buffy sent at 11/23/2020 3:48 PM EDT    [place] pt:   Pt is calling to see if she can get a refill of ELMIRON 100 mg Oral Capsule     She said she is desperate need of this.     Does she need the apt on 6/20 in order to get this refilled?     Please advise.     [place], [place]   [place] [address]   Phone: [phone] Fax: [phone]   Hours: Not open 24 hours     Thank you.             Call History     Type Contact Phone/Fax User   11/23/2020 03:47 PM EDT Phone ([address]) [place], [place] (Self) [phone] Lemmie Evens) Flossie Buffy         RN f/u with pt. RN advised pt call her member services with her prescription drug coverage insurance. RN discussed potentially may be a deductible issue or the elmiron may not be formulary with her plan. Also , elmiron may be high tier . Pt to follow up with her insurance company and advise RN if insurance notes department can assist.   Pt appreciative of assistance and will follow up with department.     Miner Koral, CLINICAL CARE COORDINATOR  11/24/2020,  08:37

## 2020-11-24 NOTE — Nursing Note (Signed)
RN received return call from patient reporting that the Elmiron needs a prior auth in order to assist with lowering the cost. RN also received a call from Ashok Norris from [facility] [phone] reporting that if RN calls [phone] and speak with prior auth team with pt's ID of [id number] could assist with auth. RN was able to review pt's previous records and provide information to the pt's prior auth team at the above number. RN subsequently received a fax from insurance company reporting this was approved. RN updated Ashok Norris from the pharmacy and reports the prescription now ran through without any difficulty. Pt will have significant less co pay of $100 dollars per month.   Ashok Norris to update pt.   RN scanned approval into media. Medication approved until 11/24/21.     Mayah Urquidi, CLINICAL CARE COORDINATOR  11/24/2020, 15:17

## 2020-11-29 ENCOUNTER — Other Ambulatory Visit: Payer: Self-pay

## 2020-11-29 ENCOUNTER — Encounter (INDEPENDENT_AMBULATORY_CARE_PROVIDER_SITE_OTHER): Payer: Self-pay | Admitting: Urology

## 2020-11-29 ENCOUNTER — Ambulatory Visit: Payer: Medicare Other | Attending: Urology | Admitting: Urology

## 2020-11-29 VITALS — BP 122/70 | HR 62 | Temp 96.3°F | Ht 63.0 in | Wt 166.0 lb

## 2020-11-29 DIAGNOSIS — N200 Calculus of kidney: Secondary | ICD-10-CM | POA: Insufficient documentation

## 2020-11-29 DIAGNOSIS — Z87440 Personal history of urinary (tract) infections: Secondary | ICD-10-CM

## 2020-11-29 DIAGNOSIS — N3010 Interstitial cystitis (chronic) without hematuria: Secondary | ICD-10-CM

## 2020-11-29 DIAGNOSIS — Z8673 Personal history of transient ischemic attack (TIA), and cerebral infarction without residual deficits: Secondary | ICD-10-CM

## 2020-11-29 NOTE — Progress Notes (Signed)
NAME: Connie Pineda  AGE: 80 y.o.  DOB: Nov 23, 1940  SERVICE: Urology        Chief Complaint   Patient presents with   . Interstitial Cystitis     Hx stones, recurrent UTI's         S: This is a 80 y.o. female here for follow up of interstitial cystitis and urinary stone disease.  She has stab was care with [name] in December 2021 via telemedicine visit.  She has a long history of interstitial cystitis which is controlled with a combination of Elmiron 3 times a day and Klonopin.  She also has a history of urinary stone disease including bilateral PCNL 6-8 years ago.  We recommended evaluation with kidney stone protocol CT scan.  She is here today for routine 6 month follow-up.  Her symptoms remain well controlled on a combination of the Elmiron and Klonopin.  She has had no recent stone symptoms.  She did recently have a TIA.     Past Medical History  Past Medical History:   Diagnosis Date   . Cancer (CMS [place])    . Hearing loss    . Heart disease            Past Surgical History  Past Surgical History:   Procedure Laterality Date   . HX TONSILLECTOMY     . HX VENTILATORY TUBES     . MASTECTOMY, PARTIAL         Allergies:  No Known Allergies    Meds:  amLODIPine (NORVASC) 2.5 mg Oral Tablet, Take 2.5 mg by mouth  ascorbic acid (VITAMIN C ORAL), Take by mouth  aspirin 81 mg Oral Capsule, Take by mouth  atorvastatin (LIPITOR) 20 mg Oral Tablet, Take 40 mg by mouth  calcium carbonate/vitamin D3 (VITAMIN D-3 ORAL), Take by mouth  clonazePAM (KLONOPIN) 1 mg Oral Tablet, Take 1 Tablet (1 mg total) by mouth Twice daily  ELMIRON 100 mg Oral Capsule, TAKE 1 CAPSULE BY MOUTH THREE TIMES DAILY BEFORE MEALS ON AN EMPTY STOMACH  esomeprazole magnesium (NEXIUM) 40 mg Oral Capsule, Delayed Release(E.C.), Take 40 mg by mouth Every morning before breakfast  HERBAL DRUGS ORAL, Take by mouth Once a day Quercetin  metoprolol succinate (TOPROL-XL) 25 mg Oral Tablet Sustained Release 24 hr, Take 25 mg by mouth  sucralfate (CARAFATE) 1 gram Oral  Tablet, Take 1 g by mouth Three times daily before meals  zinc Sulfate (ZINCATE) 50 mg zinc (220 mg) Oral Tablet, Take 50 mg by mouth Once a day    No facility-administered medications prior to visit.       ROS:  Please see HPI.  All other systems were reviewed by me and are negative.    O:           PHYSICAL EXAM:    BP 122/70   Pulse 62   Temp 35.7 C (96.3 F) (Thermal Scan)   Ht 1.6 m (5\' 3" )   Wt 75.3 kg (166 lb 0.1 oz)   SpO2 94%   BMI 29.41 kg/m       General:  NAD.  Vital signs reviewed.  Skin:  Warm, dry, and without suspicious lesion.    HEENT:  Head is normocephalic.  Hearing adequate for conversation.  Pulmonary:  Respirations unlabored.  No audible wheeze.     Cardiovascular:  No JVD.   MS:  Ambulates without difficulty.      Urine::   Urine Dip Results:  Time collected: 1112  Glucose (  Ref Range: Negative mg/dL): Negative  Bilirubin (Ref Range: Negative mg/dL): Negative  Ketones (Ref Range: Negative mg/dL): Negative  Urine Specific Gravity (Ref Range: 1.005 - 1.030): 1.020  Blood (urine) (Ref Range: Negative mg/dL): (!) Trace Non Hemolyzed   pH (Ref Range: 5.0 - 8.0): 6.0  Protein (Ref Range: Negative mg/dL): (!) Trace  Urobilinogen (Ref Range: Negative mg/dL): Normal   Nitrite (Ref Range: Negative): Negative  Leukocytes (Ref Range: Negative WBC's/uL): Trace      Labs:   08/09/2020-creatinine 0.86  08/09/2020-WBC 6.8, hemoglobin 15.7, platelet 176    Diagnostic Studies-  06/16/2020-kidney stone protocol CT scan films reviewed by me.  This shows a 4 mm left lower pole nonobstructing renal stone.        A:    1. Interstitial cystitis  2. Kidney stones  3. History of urinary tract infection  4. Recent TIA       P:  She will continue with Elmiron 100 mg 3 times a day and Klonopin.  We will see her back in 6 months with renal ultrasound to follow-up on the left renal stone.    Orders Placed This Encounter   . US KIDNEY   . POCT URINE DIPSTICK         Dossie Arbour, APRN,FNP-BC  11/29/2020, 11:25      Dossie Arbour, [facility]  Department of Urology  [phone]  Fax- [phone]

## 2021-01-06 ENCOUNTER — Ambulatory Visit: Payer: Medicare Other | Attending: Otolaryngology | Admitting: Otolaryngology

## 2021-01-06 ENCOUNTER — Encounter (INDEPENDENT_AMBULATORY_CARE_PROVIDER_SITE_OTHER): Payer: Self-pay | Admitting: Otolaryngology

## 2021-01-06 ENCOUNTER — Other Ambulatory Visit: Payer: Self-pay

## 2021-01-06 VITALS — BP 129/73 | HR 68 | Temp 97.4°F | Ht 63.0 in | Wt 170.2 lb

## 2021-01-06 DIAGNOSIS — H9072 Mixed conductive and sensorineural hearing loss, unilateral, left ear, with unrestricted hearing on the contralateral side: Secondary | ICD-10-CM | POA: Insufficient documentation

## 2021-01-06 DIAGNOSIS — H6900 Patulous Eustachian tube, unspecified ear: Secondary | ICD-10-CM

## 2021-01-06 DIAGNOSIS — Z9622 Myringotomy tube(s) status: Secondary | ICD-10-CM

## 2021-01-06 NOTE — Progress Notes (Signed)
[place] PROGRESS NOTE    Name: Connie Pineda, 80 y.o. female  MRN: [id number]  Date of Birth: [birth year]  Date of Service: 01/06/2021    Chief Complaint:    Chief Complaint   Patient presents with    Follow Up       Subjective: Connie Pineda is a 80 y.o. female returning for patulous eustachian tube. She has bilateral T tubes placed at [facility] in 2020. She has B/L aural pressure and when she speaks, she reports her voice sounds like she is in a tunnel. She thinks maybe her hearing has decreased in the last 6 months. She denies tinnitus, otalgia, otorrhea, vertigo. She reports today she is a little light headed and did not sleep well last night. She is hydrating well. She denies recurrent ear infections.     Previous workup with cVEMP and CT IAMs was negative for SSCD.     She recently had a stroke and has been fatigued and slightly "out of it". She is following with a physician for this and is getting blood work soon. She is on a baby aspirin. She has hx of mastectomy, interstitial cystitis, and non-hodgkin's lymphoma.       Past Medical History:  Past Medical History:   Diagnosis Date    Cancer (CMS [facility])     CVA (cerebrovascular accident) (CMS [place])     Hearing loss     Heart disease            ROS:   ROS negative except per HPI    Physical Exam:  BP (Non-Invasive): 129/73 Temperature: 36.3 C (97.4 F) Heart Rate: 68      Height: 160 cm ('5\' 3"'$ ) Weight: 77.2 kg (170 lb 3.1 oz) Body mass index is 30.15 kg/m.    General Appearance: Well-appearing 80 y.o. female who is pleasant, cooperative and in no acute distress.  Head: Normocephalic.  Face: Symmetric, and without obvious lesions.   Eyes: Conjunctivae clear, pupils equal and round.    Binocular microscopy was used at today's visit for diagnostic and treatment purposes  Right ear   Pinna: Normal shape and position.    External auditory canal: Patent and without inflammation.   Tympanic membrane:    Intact, translucent, midposition, with good middle ear aeration. Tube in place, surrounded by wax. Wax in lumen of tube.   Left ear   Pinna: Normal shape and position.    External auditory canal: Patent and without inflammation.   Tympanic membrane:   Intact, translucent, midposition, with good middle ear aeration. Tube in place, patent      Hypopharynx/Larynx: Voice normal.  Neurologic: Grossly normal. Cranial nerves II-XII grossly intact bilaterally.       Review of Information:  Data Reviewed:  CT IAMS 07/08/2020: bilateral tympanostomy tubes in place. Air visualized within the expected course of the ET's bilaterally suggesting possible patulous ET's. No evidence of SSCD    cVEMP 07/08/2020: cVEMP waveforms were present and repeatable bilaterally in response to a 105 dB 500 Hz toneburst stimuli. cVEMP threshold was elevated in the right ear and normal for the left ear. Interaural latency difference was normal. Interamplitude difference was 54% which is abnormal. cVEMP is not suggestive of a superior canal dehiscence in either ear.    Audiogram was performed 05/20/2020. SRT on the right was 15 dB, and SRT on the left was 15 dB. Word recognition on the right was 96% at  55 dB, and word recognition on the left was 96% at 55 dB.   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    Special Procedures:       Assessment/Plan      ICD-10-CM    1. Patulous Eustachian tube, unspecified laterality  H69.00    2. 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 female who presents with patulous eustachian tubes. Tubes are patent.    -Continue sweet oil drops  -Continue PatulEND drops  -Follow up in clinic in 6 wks or sooner if concerns arise. Will discuss future need for removal of  tubes.    Angelique Holm, MD 01/06/2021 12:05       [name], MD       I saw and examined the patient.  I reviewed the resident's note.  I agree with the findings and plan of care as documented in the resident's note.  Any exceptions/additions are edited/noted.    [name], MD    CC:    PCP Elgie Collard, MD  [place] [id number]  [place] [address]   Referring Provider Self, Referral  No address on file

## 2021-02-09 ENCOUNTER — Ambulatory Visit (INDEPENDENT_AMBULATORY_CARE_PROVIDER_SITE_OTHER): Payer: Self-pay | Admitting: Urology

## 2021-02-09 NOTE — Nursing Note (Signed)
Message from Sharman Cheek sent at 02/09/2021 1:57 PM EDT    "Z" patient calling to speak to [facility] about Elmiron. Requesting a return call        Call History     Type Contact Phone/Fax User   02/09/2021 01:56 PM EDT Phone (Incoming) Dayamy, Wally (Self) [phone] Jerilynn Mages) Sharman Cheek         RN had obtained prior auth for Pt's elmiron June 2022. RN returned pt's call at [phone]. Phone continuously rang without any answer and no option to leave voicemail. RN did f/u with pt's pharmacist Jana at [phone] at the [facility]. Per Ashok Norris pt had called in and had abrupt vision changes severely that she went to the ER last night. Pt was unsure if Elmiron was causing her acute symptoms. Per Ashok Norris pt had a recent TIA and is unsure if this is definitive cause to her vision changes. Ashok Norris reports she encouraged pt to see ophthalmologist. RN was not able to reach pt to further discuss.     Kathlee Barnhardt, CLINICAL CARE COORDINATOR  02/09/2021, 14:27

## 2021-02-10 ENCOUNTER — Ambulatory Visit (HOSPITAL_BASED_OUTPATIENT_CLINIC_OR_DEPARTMENT_OTHER): Payer: Medicare Other | Admitting: Internal Medicine

## 2021-02-10 ENCOUNTER — Encounter (HOSPITAL_BASED_OUTPATIENT_CLINIC_OR_DEPARTMENT_OTHER): Payer: Self-pay

## 2021-02-22 ENCOUNTER — Encounter (HOSPITAL_BASED_OUTPATIENT_CLINIC_OR_DEPARTMENT_OTHER): Payer: Self-pay | Admitting: Internal Medicine

## 2021-02-22 ENCOUNTER — Inpatient Hospital Stay (HOSPITAL_BASED_OUTPATIENT_CLINIC_OR_DEPARTMENT_OTHER)
Admission: RE | Admit: 2021-02-22 | Discharge: 2021-02-22 | Disposition: A | Discharge status: Home / Self Care | Payer: Medicare Other | Source: Ambulatory Visit

## 2021-02-22 ENCOUNTER — Ambulatory Visit: Payer: Medicare Other | Attending: Internal Medicine | Admitting: Internal Medicine

## 2021-02-22 ENCOUNTER — Other Ambulatory Visit: Payer: Self-pay

## 2021-02-22 VITALS — BP 118/55 | HR 60 | Temp 97.4°F | Resp 18 | Ht 63.0 in | Wt 168.7 lb

## 2021-02-22 DIAGNOSIS — H6903 Patulous Eustachian tube, bilateral: Secondary | ICD-10-CM

## 2021-02-22 DIAGNOSIS — H6900 Patulous Eustachian tube, unspecified ear: Secondary | ICD-10-CM | POA: Insufficient documentation

## 2021-02-22 DIAGNOSIS — Z85828 Personal history of other malignant neoplasm of skin: Secondary | ICD-10-CM

## 2021-02-22 DIAGNOSIS — Z8572 Personal history of non-Hodgkin lymphomas: Secondary | ICD-10-CM

## 2021-02-22 DIAGNOSIS — Z86000 Personal history of in-situ neoplasm of breast: Secondary | ICD-10-CM | POA: Insufficient documentation

## 2021-02-22 DIAGNOSIS — Z79899 Other long term (current) drug therapy: Secondary | ICD-10-CM

## 2021-02-22 DIAGNOSIS — Z9010 Acquired absence of unspecified breast and nipple: Secondary | ICD-10-CM | POA: Insufficient documentation

## 2021-02-22 DIAGNOSIS — Z808 Family history of malignant neoplasm of other organs or systems: Secondary | ICD-10-CM | POA: Insufficient documentation

## 2021-02-22 DIAGNOSIS — N3010 Interstitial cystitis (chronic) without hematuria: Secondary | ICD-10-CM | POA: Insufficient documentation

## 2021-02-22 DIAGNOSIS — C8231 Follicular lymphoma grade IIIa, lymph nodes of head, face, and neck: Secondary | ICD-10-CM | POA: Insufficient documentation

## 2021-02-22 LAB — CBC WITH DIFF
BASOPHIL #: <0.1 10*3/uL (ref <=0.20)
BASOPHIL %: 1 %
EOSINOPHIL #: 0.22 10*3/uL (ref <=0.50)
EOSINOPHIL %: 3 %
HCT: 40.6 % (ref 34.8–46.0)
HGB: 14.1 g/dL (ref 11.5–16.0)
IMMATURE GRANULOCYTE #: <0.1 10*3/uL (ref <0.10)
IMMATURE GRANULOCYTE %: 1 % (ref 0–1)
LYMPHOCYTE #: 1.68 10*3/uL (ref 1.00–4.80)
LYMPHOCYTE %: 26 %
MCH: 29.8 pg (ref 26.0–32.0)
MCHC: 34.7 g/dL (ref 31.0–35.5)
MCV: 85.8 fL (ref 78.0–100.0)
MONOCYTE #: 0.47 10*3/uL (ref 0.20–1.10)
MONOCYTE %: 7 %
MPV: 10.8 fL (ref 8.7–12.5)
NEUTROPHIL #: 3.99 10*3/uL (ref 1.50–7.70)
NEUTROPHIL %: 62 %
PLATELETS: 167 10*3/uL (ref 150–400)
RBC: 4.73 10*6/uL (ref 3.85–5.22)
RDW-CV: 12.9 % (ref 11.5–15.5)
WBC: 6.5 10*3/uL (ref 3.7–11.0)

## 2021-02-22 LAB — COMPREHENSIVE METABOLIC PANEL, NON-FASTING
ALBUMIN: 3.8 g/dL (ref 3.4–4.8)
ALKALINE PHOSPHATASE: 92 U/L (ref 55–145)
ALT (SGPT): 27 U/L — ABNORMAL HIGH (ref 8–22)
ANION GAP: 9 mmol/L (ref 4–13)
AST (SGOT): 20 U/L (ref 8–45)
BILIRUBIN TOTAL: 1.6 mg/dL — ABNORMAL HIGH (ref 0.3–1.3)
BUN/CREA RATIO: 17 (ref 6–22)
BUN: 14 mg/dL (ref 8–25)
CALCIUM: 10 mg/dL (ref 8.8–10.2)
CHLORIDE: 108 mmol/L (ref 96–111)
CO2 TOTAL: 24 mmol/L (ref 23–31)
CREATININE: 0.84 mg/dL (ref 0.60–1.05)
ESTIMATED GFR: 70 mL/min/BSA (ref >=60)
GLUCOSE: 108 mg/dL (ref 65–125)
POTASSIUM: 3.6 mmol/L (ref 3.5–5.1)
PROTEIN TOTAL: 6.4 g/dL (ref 6.0–8.0)
SODIUM: 141 mmol/L (ref 136–145)

## 2021-02-22 LAB — PLATELETS AND ANC CANCER CENTER
NEUTROPHILS #: 3.99 10*3/uL (ref 1.50–7.70)
PLATELET COUNT: 167 10*3/uL (ref 150–400)

## 2021-02-22 LAB — LDH: LDH: 157 U/L (ref 125–220)

## 2021-02-23 NOTE — Cancer Center Note (Signed)
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     - 03/08/18:  CT neck revealed abnormal enlargement of nasopharyngeal soft tissues and pathological lymphadenopathy in the neck.   - 03/22/18: right cervical lymph node and nasopharyngeal biopsy confirmed follicular lymphoma, grade 3A.  - 04/17/18:  PET-CT revealed notable bilateral adenopathy in the neck and posterior nasopharynx, hypermetabolic consistent with malignancy.  No pathologically enlarged or significantly hypermetabolic adenopathy in the chest, abdomen or pelvis.  No splenomegaly.   - 11/19-2/21/20:  Status post cycle 1 of Rituxan alone, then 3 cycles of combined bendamustine and Rituximab completed by 08/02/18.  Bendamustine dose reduced to 70 milligrams/meter squared for interstitial cystitis.  - 08/22/18:  PET-CT revealed interval resolution of bilateral hypermetabolic cervical and posterior nasopharyngeal adenopathy.  No evidence for residual or recurrent metabolically active tumor.  - 08/2018:  Maintenance treatment with rituximab was discussed by the primary oncologist, however given her concerns with interstitial cystitis and  memory issues, shared decision was made to hold off on maintenance treatment and pursue watchful observation.  - 10/30/18:  CT neck revealed interval resolution of nasopharyngeal mass and cervical adenopathy bilaterally.  No residual mass or adenopathy.  - 06/03/19:  PET CT showed no evidence of recurrence of lymphoma.    Patient denies fevers, unintentional weight loss, night sweats, enlarged lymph nodes, abdominal  pain, n/v/d. She is still have issues with her ears, noted to have patulous eustachian tube. She went to [facility] and they agreed. She is currently residing in a senior/assisted living.      REVIEW OF SYSTEMS:   All systems reviewed and are negative except for what is mentioned in HPI    Past Medical History:   Diagnosis Date   . Cancer (CMS [place])    . CVA (cerebrovascular accident) (CMS [place])    . Hearing loss    . Heart disease      Past Surgical History:   Procedure Laterality Date   . HX TONSILLECTOMY     . HX VENTILATORY TUBES     . MASTECTOMY, PARTIAL       Social History     Socioeconomic History   . Marital status: Widowed     Spouse name: Not on file   . Number of children: Not on file   . Years of education: Not on file   . Highest education level: Not on file   Occupational History   . Not on file   Tobacco Use   . Smoking status: Never Smoker   . Smokeless tobacco: Never Used   Vaping Use   . Vaping Use: Never used   Substance and Sexual Activity   . Alcohol use: Never   . Drug use: Never   . Sexual activity: Not on file   Other Topics Concern   . Not on file   Social History Narrative   . Not on file     Social Determinants of Health     Financial Resource Strain: Not on file   Food  Insecurity: Not on file   Transportation Needs: Not on file   Physical Activity: Not on file   Stress: Not on file   Intimate Partner Violence: Not on file   Housing Stability: Not on file     Family Medical History:     Problem Relation (Age of Onset)    Heart Disease Father    Thyroid Cancer Mother        PHYSICAL EXAM:  Vitals:    02/22/21 1014   BP: (!) 118/55   Pulse: 60   Resp: 18   Temp: 36.3 C (97.4 F)   SpO2: 96%   Weight: 76.5 kg (168 lb 10.4 oz)   Height: 1.6 m (_0 )   BMI: 29.94   Body surface area is 1.84 meters squared.  Body mass index is 29.88 kg/m.    ECOG: (1) Restricted in physically strenuous activity, ambulatory and able to do work of light nature    General: well appearing elderly female in no  distress. Hard of hearing  Eyes: EOMI, anicteric sclera  HENT: Head NCAT. mucous membranes moist.  Neck: supple  Lungs: clear to auscultation bilaterally.   Cardiovascular: RRR, no m/r/g  Abdomen: soft, non-tender, no HSM  Extremities: no edema  Skin: Skin warm and dry  Lymphatics: no palpable lymphadenopathy  Neurologic: AAOx3, no focal deficits  Psychiatric: affect normal    LABS  Last CBC  (Last result in the past 2 years)      WBC   HGB   HCT   MCV   Platelets      02/22/21 0953 6.5   14.1   40.6   85.8   167          Last BMP  (Last result in the past 2 years)      Na   K   Cl   CO2   BUN   Cr   Calcium   Glucose   Glucose-Fasting        02/22/21 0953 141   3.6   108   24   14   0.84   10.0   108           Last Hepatic Panel  (Last result in the past 2 years)      Albumin   Total PTN   Total Bili   Direct Bili   Ast/SGOT   Alt/SGPT   Alk Phos        02/22/21 0953 3.8   6.4   1.6  Comment: Naproxen therapy can falsely elevate total bilirubin levels.     20   27   92             IMAGING:  CT neck 08/09/20 reviewed by me, no abnormal lymph nodes noted  IMPRESSION:  No suspicious lymph nodes by CT criteria within the neck.      PATHOLOGY:  D.  LYMPH NODE, RIGHT NECK, BIOPSY (OSS, [id number], 14 slides, [facility]; obtained 24/58/0998):    - Follicular lymphoma, grade 3a, follicular pattern.  (See comment)     E.  NASOPHARYNX, BIOPSY (OSS, [id number], 14 slides, [facility]; obtained 33/82/5053):    - Follicular lymphoma, grade 3a, follicular pattern.      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     1. Follicular lymphoma (grade 3a)  - Diagnosed 03/2018 on right neck lymph node and nasopharyngeal biopsies  - s/p 4 cycles of Rituximab, 3 cycles including dose reduced dose  Bendamustine.  - End of treatment imaging showed complete response.  - Maintenance treatment with rituximab was discussed by the primary oncologist, however given her concerns with interstitial cystitis and  memory issues, shared decision was made to hold off on maintenance Rituximab and pursue watchful observation.   - Last PET-CT was performed in 05/2019 which showed no evidence of recurrence  - Today we discussed a normal healthy blood system and the derangements that occur in follicular lymphoma.   - Discussed the excellent response she had to the treatment and plan to continue surveillance. CT neck without evidence of disease.   - Reassured the patient that her ear issues are not secondary to lymphoma.    2.Patulous eustachian tube, history of b/l tympanostomy tube placement:  - following with ENT. Went to [place] and the diagnosis was the same.    3. Chronic interstitial cystitis:  - On Elmiron  - following with urology    RTC 6 months with HM1.    Katheren Puller, MD  02/23/2021, 14:45

## 2021-03-28 ENCOUNTER — Other Ambulatory Visit (HOSPITAL_COMMUNITY): Payer: Self-pay | Admitting: Family Medicine

## 2021-03-28 DIAGNOSIS — I6529 Occlusion and stenosis of unspecified carotid artery: Secondary | ICD-10-CM

## 2021-05-26 ENCOUNTER — Other Ambulatory Visit: Payer: Self-pay

## 2021-05-26 ENCOUNTER — Encounter (INDEPENDENT_AMBULATORY_CARE_PROVIDER_SITE_OTHER): Payer: Self-pay | Admitting: Otolaryngology

## 2021-05-26 ENCOUNTER — Ambulatory Visit: Payer: Medicare Other | Attending: Otolaryngology | Admitting: Otolaryngology

## 2021-05-26 ENCOUNTER — Ambulatory Visit (INDEPENDENT_AMBULATORY_CARE_PROVIDER_SITE_OTHER): Payer: Self-pay | Admitting: Otolaryngology

## 2021-05-26 VITALS — Temp 97.4°F | Ht 63.0 in | Wt 164.9 lb

## 2021-05-26 DIAGNOSIS — H6900 Patulous Eustachian tube, unspecified ear: Secondary | ICD-10-CM | POA: Insufficient documentation

## 2021-05-26 DIAGNOSIS — H7292 Unspecified perforation of tympanic membrane, left ear: Secondary | ICD-10-CM | POA: Insufficient documentation

## 2021-05-26 NOTE — Telephone Encounter (Signed)
Regarding: [place]  ----- Message from [place] sent at 05/25/2021 11:37 AM EST -----  The patient called about a follow up; patient was to be seen in 6 weeks from last appt but she did not realize this. The best number to reach her regarding this is [phone]. Thank you.

## 2021-05-26 NOTE — Progress Notes (Signed)
[facility] PROGRESS NOTE    Name: Connie Pineda, 80 y.o. female  MRN: [id number]  Date of Birth: 03/31/1941  Date of Service: 05/26/2021    Chief Complaint:    Chief Complaint   Patient presents with    Ear Problem(s)       Subjective: Connie Pineda is a 80 y.o. female returning for patulous eustachian tube. She has bilateral T tubes placed at [facility] in 2020. She does not feel benefit with the tubes. She has B/L aural pressure and when she speaks, she reports her voice sounds like she is in a tunnel. Previous workup with cVEMP and CT IAMs was negative for SSCD. Her ears feel full and she feels herself speaking in a tunnel. She does not using hearing aids. No history of headaches. She was prescribed patulin drops at last visit which she has been using and does not feel improvement from the drops.   She recently had a stroke and has been fatigued and slightly "out of it". She is following with a physician for this and is getting blood work soon. She is on a baby aspirin. She has hx of mastectomy, interstitial cystitis, and non-hodgkin's lymphoma.       Review of Information:  Data Reviewed:  CT IAMS 07/08/2020: bilateral tympanostomy tubes in place. Air visualized within the expected course of the ET's bilaterally suggesting possible patulous ET's. No evidence of SSCD     cVEMP 07/08/2020: cVEMP waveforms were present and repeatable bilaterally in response to a 105 dB 500 Hz toneburst stimuli. cVEMP threshold was elevated in the right ear and normal for the left ear. Interaural latency difference was normal. Interamplitude difference was 54% which is abnormal. cVEMP is not suggestive of a superior canal dehiscence in either ear.     Audiogram was performed 05/20/2020. SRT on the right was 15 dB, and SRT on the left was 15 dB. Word recognition on the right was 96% at 55 dB, and word recognition on the left was 96% at 55 dB.   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    Past Medical History:  Past Medical History:   Diagnosis Date    Cancer (CMS [facility])     CVA (cerebrovascular accident) (CMS [place])     Hearing loss     Heart disease            ROS:   ROS negative except per HPI    Physical Exam:    Temperature: 36.3 C (97.4 F)        Height: 160 cm (5\' 3" ) Weight: 74.8 kg (164 lb 14.5 oz) Body mass index is 29.21 kg/m.    General Appearance: Well-appearing 80 y.o. female who is pleasant, cooperative and in no acute distress.  Head: Normocephalic.  Face: Symmetric, and without obvious lesions.   Eyes: Conjunctivae clear, pupils equal and round.    Binocular microscopy was used at today's visit for diagnostic and treatment purposes  Right ear   Pinna:  moderately narrow with cerumen which was removed    External auditory canal: Patent and without inflammation.   Tympanic membrane:   Intact, translucent, midposition, with good middle ear aeration. Tube in place, surrounded by wax.   Left ear  Pinna: moderately narrow with cerumen which was removed   External auditory canal: Patent and without inflammation.   Tympanic membrane:   Intact, translucent, midposition, with good middle ear aeration. Tube in place, crusting filling tube, tube was removed. Perforation in place with inflammation.    Hypopharynx/Larynx: Voice normal.  Neurologic: Grossly normal. Cranial nerves II-XII grossly intact bilaterally.     Special Procedures:  ENT, [place]  [place] [id number]  Operated by [employer]  Procedure Note    Name: Connie Pineda MRN:  [id number]   Date: 05/26/2021 Age: 80 y.o.       [phone] - TYMPANIC MEMBRANE REPAIR, W/ OR W/O SITE PREP OF PERFORATION FOR CLOSURE, W/ OR W/O PATCH (AMB ONLY)  Performed by: Annye English, MD  Authorized by: Connie Aden, MD     Time Out:     Immediately before the procedure, a time out was called:  Yes    Patient verified:  Yes    Procedure Verified:  Yes    Site Verified:  Yes  Documentation:      [facility] NOTE    Patient Name: Connie Pineda  Patient MRN: [id number]  Patient DOB: 1940-06-27  Date of Service:05/26/2021  Procedure: paper patch myringoplasty  Pre-procedure diagnosis: left TM perforation  Post-procedure diagnosis: Same  Description of Procedure: Description of Procedure: The patient was positioned appropriately for left sided paper patch myringoplasty. The left sided TM perforation was brought into view. Cigarette paper was trimmed to fit the defect. Using a pick, the paper patch was placed over the span of the TM perforation. The patient tolerated the procedure well and without apparent complication.      Annye English, MD 05/26/2021 11:17                 Annye English, MD      Assessment/Plan      ICD-10-CM    1. Patulous Eustachian tube, unspecified laterality  H69.00       2. Perforation of left tympanic membrane  H72.92 [id number] - TYMPANIC MEMBRANE REPAIR, W/ OR W/O SITE PREP OF PERFORATION FOR CLOSURE, W/ OR W/O PATCH (AMB ONLY)           Rimsha Trembley is a 80 y.o. female who presents with patulous eustachian tubes. Tubes are covered in crusting. Right tube was patent. Left tube was filled with crusting which was removed today and paper patch replaced.    Return in 3 months, apply sweet oil to right ear, keep left ear dry  She can use patulin if she does feel benefit    Annye English, MD 05/26/21 11:20   Otolaryngology PGY-5 Resident      I saw and examined the patient.  I reviewed the resident's note.  I agree with the findings and plan of care as documented in the resident's note.  Any exceptions/additions are edited/noted.    Connie Aden, MD      CC:    PCP Elgie Collard, MD  [place] [address]  [place] [address]   Referring Provider Self, Referral  No address on file

## 2021-05-26 NOTE — Patient Instructions (Signed)
Right Ear 4 drops of mineral oil/sweet oil.   Keep left ear dry.

## 2021-05-26 NOTE — Telephone Encounter (Signed)
Pt had appt this morning.

## 2021-05-26 NOTE — Procedures (Signed)
ENT, [place]  [place] [address]  Operated by [facility]  Procedure Note    Name: [name] MRN:  [id number]   Date: 05/26/2021 Age: 80 y.o.       [phone] - TYMPANIC MEMBRANE REPAIR, W/ OR W/O SITE PREP OF PERFORATION FOR CLOSURE, W/ OR W/O PATCH (AMB ONLY)  Performed by: Annye English, MD  Authorized by: [name], MD     Time Out:     Immediately before the procedure, a time out was called:  Yes    Patient verified:  Yes    Procedure Verified:  Yes    Site Verified:  Yes  Documentation:      [place] NOTE    Patient Name: Connie Pineda  Patient MRN: [id number]  Patient DOB: [birth year]  Date of Service:05/26/2021  Procedure: paper patch myringoplasty  Pre-procedure diagnosis: left TM perforation  Post-procedure diagnosis: Same  Description of Procedure: Description of Procedure: The patient was positioned appropriately for left sided paper patch myringoplasty. The left sided TM perforation was brought into view. Cigarette paper was trimmed to fit the defect. Using a pick, the paper patch was placed over the span of the TM perforation. The patient tolerated the procedure well and without apparent complication.      Annye English, MD 05/26/2021 11:17                 Annye English, MD      I saw and examined the patient.  I reviewed the resident's note.  I agree with the findings and plan of care as documented in the resident's note.  Any exceptions/additions are edited/noted.    [name], MD

## 2021-05-30 ENCOUNTER — Ambulatory Visit (HOSPITAL_BASED_OUTPATIENT_CLINIC_OR_DEPARTMENT_OTHER)
Admission: RE | Admit: 2021-05-30 | Discharge: 2021-05-30 | Disposition: A | Discharge status: Home / Self Care | Payer: Medicare Other | Source: Ambulatory Visit

## 2021-05-30 ENCOUNTER — Other Ambulatory Visit: Payer: Self-pay

## 2021-05-30 ENCOUNTER — Encounter (INDEPENDENT_AMBULATORY_CARE_PROVIDER_SITE_OTHER): Payer: Self-pay | Admitting: Urology

## 2021-05-30 ENCOUNTER — Ambulatory Visit (INDEPENDENT_AMBULATORY_CARE_PROVIDER_SITE_OTHER): Payer: Self-pay | Admitting: Otolaryngology

## 2021-05-30 ENCOUNTER — Ambulatory Visit: Payer: Medicare Other | Attending: Urology | Admitting: Urology

## 2021-05-30 ENCOUNTER — Ambulatory Visit (INDEPENDENT_AMBULATORY_CARE_PROVIDER_SITE_OTHER): Payer: Self-pay | Admitting: Internal Medicine

## 2021-05-30 VITALS — BP 130/80 | HR 54 | Temp 97.2°F | Ht 63.0 in | Wt 163.8 lb

## 2021-05-30 DIAGNOSIS — N3010 Interstitial cystitis (chronic) without hematuria: Secondary | ICD-10-CM | POA: Insufficient documentation

## 2021-05-30 DIAGNOSIS — Z87442 Personal history of urinary calculi: Secondary | ICD-10-CM

## 2021-05-30 DIAGNOSIS — N200 Calculus of kidney: Secondary | ICD-10-CM

## 2021-05-30 NOTE — Telephone Encounter (Signed)
Regarding: Kellermeyer  ----- Message from [place] sent at 05/27/2021  3:17 PM EST -----  Pt called and needed to ask a question, she wants to know if she can fly to [place] because the air is thin and the oxygen is thin. She didn't;t know if she could or not cause of her ear. Please call back to advise. Thank you!!!

## 2021-05-30 NOTE — Telephone Encounter (Addendum)
Tried to call pt to discuss and explained appt is scheduled with DR Mathis Fare.  Unable to leave VM machine full         Regarding: Connie Pineda  ----- Message from Elease Hashimoto sent at 05/27/2021  2:45 PM EST -----  Mathis Fare - patient is calling stating that a Dr from [facility] stated that she should consult w/ a second doctor because of the size of the clot in her neck, and she is unsure of what to do please call     Thanks

## 2021-05-30 NOTE — Telephone Encounter (Signed)
Pt wondering if she could fly to [place], has not bought ticket yet. Would like to make sure with Dr. Roslyn Smiling.

## 2021-05-30 NOTE — Progress Notes (Signed)
S:  Returns for follow up,  History of IC.  Had a recent kidney stone that was treated at [facility] -- thinks it was the left side.  No records to corroborate.  She thinks stent was removed.    RUS today -- no stones seen.    O:  Examination:  Well-developed, well-nourished woman  in no apparent distress.  Abdomen is not distended.  CVA tenderness: none .  No suprapubic fullness.    A:  IC/PBS/Kidney stones    P:  I had a long discussion with Jamea Robicheaux regarding their kidney stones.  I explained to them that kidney stones can recur.  The recurrence rate is approximately 20%.  Therefore, stone prevention is key.  Janye Maynor is encouraged to drink approximately 6 to 8 glasses of water per day.  In addition, drinks such as lemonade and orange juice are high in citric acid which is a natural inhibitor of stone formation.    I explained that in some patients we perform additional blood testing and urine studies for stone formation.  The most common abnormalities seen on these tests are low urine volume and low levels of citric acid.  Therefore, the patient was again advised to follow the abovementioned advice regarding drinking water and lemonade/orange juice.    Storm Dulski was advised to remember the signs of kidney stone passage which include unrelenting flank/lower quadrant pain.  If this pain does not respond to over the counter pain medication, Marquetta Weiskopf was advised to go to the nearest emergency department for evaluation.  They were advised that if any x-rays are performed to ask for the CD disc upon their release and bring these films to [name] for review.  This can reduce additional unnecessary x-rays being performed.    Shannen Flansburg was appraised that in the acute passage of a stone that is blocked in the ureter in the setting of infection and impaired kidney function or chronic pain that we try to place a stent to drain the kidney.  We go back to get the stone at a later time.    Angelyne Terwilliger was appraised that  small stones such as those less than 5 mm have a greater than 50% chance of passing on their own.  They were told that it can take up to 40 days to pass such a stone and to expect that a conservative approach may need over a month to see if the stone passes.      Finally, Charisma Charlot was advised that treatment of kidney stones involves multiple modalities including shock wave lithotripsy, going up the ureter with a telescope and laser to break the stone (ureteroscopy), going through the back to break the stone (percutaneous surgery) and rarely open surgery.  The chosen surgical procedure depends on the size of the stone and its location in the kidney or ureter.      The patient was told of three important components they can undertake to improve their voiding:  They are:  (1)  Scheduled voiding, (2) Limiting liquids prior to bedtime, and (3) Avoiding bladder irritants.  I discussed each component with the patient in detail as written below:     First, Ellene Route was instructed to assist their voiding by voiding on a schedule of approximately every 2-3 hours even if they do not have to void.  In addition, they are instructed to double-void.  This means they should void in the bathroom and then attempt to void again  a few minutes later, this may allow for elimination of some more left over urine.  This maneuver can improve bladder function.    Second,  Ellene Route was instructed to limit liquids approximately 2 hours prior to bedtime.  This is because the increased fluid intake can lead to increased urine output overnight.  I advised the patient that is normal to void up to 2 times per night.      Finally, Augustina Braddock was told about reduction of bladder irritants.  This may include maneuvers such as limiting the the intake of acid foods, spicy foods and caffeinated beverages.  In some patients, these foods/beverages can cause bladder irritation and symptoms of urgency, frequency, pelvic and urethral pain.  If such foods  cause problems, they should be avoided.    Return in 6-12 months or sooner if issues arise.    Woodfin Ganja, MD, MBA, FACS  Professor and [facility]  Department of Urology    Credentialed Pain Management Practitioner  [employer]

## 2021-06-01 ENCOUNTER — Ambulatory Visit (INDEPENDENT_AMBULATORY_CARE_PROVIDER_SITE_OTHER): Payer: Self-pay | Admitting: Otolaryngology

## 2021-06-01 NOTE — Telephone Encounter (Signed)
Regarding: Kellermeyer  ----- Message from [place] sent at 06/01/2021  9:31 AM EST -----  Pt called and is wanting to know about her ear medication for her ears. She is not sure what ear she needs to put the sweet oil in.  She does one medication in one ear and the sweet oil in the other. Please call back to advise. Thank you!!!

## 2021-06-01 NOTE — Telephone Encounter (Signed)
Per last note pt should do sweet oil in the right ear. Pt stated understanding.

## 2021-06-01 NOTE — Telephone Encounter (Signed)
Kellermeyer, Noah Charon, MD  Mendel Ryder, RN  Caller: Unspecified (5 days ago, 3:15 PM)  I would wait at least 2 weeks from the procedure to fly. Then take afrin nasal spray before boarding flight.     Abagail Kitchens to pt, advised of the above. Pt states understanding, pt wrote this down . Would like for EC to be updated. EC updated per patient. Pt denies further questions.

## 2021-06-08 ENCOUNTER — Ambulatory Visit (INDEPENDENT_AMBULATORY_CARE_PROVIDER_SITE_OTHER): Payer: Self-pay | Admitting: Internal Medicine

## 2021-06-20 ENCOUNTER — Other Ambulatory Visit: Payer: Self-pay

## 2021-06-20 ENCOUNTER — Encounter (HOSPITAL_COMMUNITY): Payer: Self-pay | Admitting: VASCULAR SURGERY

## 2021-06-20 ENCOUNTER — Ambulatory Visit: Payer: Medicare Other | Attending: Family Medicine | Admitting: VASCULAR SURGERY

## 2021-06-20 DIAGNOSIS — I6529 Occlusion and stenosis of unspecified carotid artery: Secondary | ICD-10-CM | POA: Insufficient documentation

## 2021-06-20 DIAGNOSIS — I69911 Memory deficit following unspecified cerebrovascular disease: Secondary | ICD-10-CM

## 2021-06-24 ENCOUNTER — Other Ambulatory Visit (INDEPENDENT_AMBULATORY_CARE_PROVIDER_SITE_OTHER): Payer: Self-pay | Admitting: Urology

## 2021-06-24 ENCOUNTER — Encounter (INDEPENDENT_AMBULATORY_CARE_PROVIDER_SITE_OTHER): Payer: Self-pay

## 2021-06-26 ENCOUNTER — Encounter (HOSPITAL_COMMUNITY): Payer: Self-pay | Admitting: VASCULAR SURGERY

## 2021-06-26 NOTE — Progress Notes (Signed)
Department of Vascular Surgery  [facility] History and Physical    Date: 06/26/2021  Patient: Connie Pineda  DOB: 06-12-1941  PCP: Elgie Collard, MD    Chief Complaint:   Chief Complaint   Patient presents with   . Carotid Stenosis   . Establish Care       Subjective:     HPI: Connie Pineda is a 81 y.o. White female who presents for evaluation of Carotid stenosis. 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 She presents to clinic alone today and is unable to provide a clear history about why she was referred to me. She denies any recent symptoms of amaurosis, TIA or stroke within the last 6 months.    PMH:   Past Medical History:   Diagnosis Date   . Cancer (CMS [place])    . CVA (cerebrovascular accident) (CMS [place])    . Hearing loss    . Heart disease            Family Hx:  Family Medical History:     Problem Relation (Age of Onset)    Heart Disease Father    Thyroid Cancer Mother            Medications:  Current Outpatient Medications   Medication Sig Dispense Refill   . amLODIPine (NORVASC) 2.5 mg Oral Tablet Take 1 Tablet (2.5 mg total) by mouth     . ascorbic acid (VITAMIN C ORAL) Take by mouth (Patient not taking: Reported on 02/22/2021)     . aspirin 81 mg Oral Capsule Take by mouth     . atorvastatin (LIPITOR) 20 mg Oral Tablet Take 2 Tablets (40 mg total) by mouth     . calcium carbonate/vitamin D3 (VITAMIN D-3 ORAL) Take by mouth (Patient not taking: Reported on 02/22/2021)     . clonazePAM (KLONOPIN) 1 mg Oral Tablet Take 1 Tablet (1 mg total) by mouth Twice daily 60 Tablet 4   . esomeprazole magnesium (NEXIUM) 40 mg Oral Capsule, Delayed Release(E.C.) Take 1 Capsule (40 mg total) by mouth Every morning before breakfast     . HERBAL DRUGS ORAL Take by mouth Once a day Quercetin     . metoprolol succinate (TOPROL-XL) 25 mg Oral Tablet Sustained Release 24 hr Take 1 Tablet (25 mg total) by mouth     . Pentosan Polysulfate Sodium (ELMIRON) 100 mg Oral Capsule TAKE ONE  CAPSULE BY MOUTH 3 TIMES A DAY BEFORE MEALS 90 Capsule 3   . sucralfate (CARAFATE) 1 gram Oral Tablet Take 1 g by mouth Three times daily before meals (Patient not taking: Reported on 06/20/2021)     . triamterene-hydroCHLOROthiazide (DYAZIDE) 37.5-25 mg Oral Capsule Take 1 Capsule by mouth Every morning     . zinc Sulfate (ZINCATE) 50 mg zinc (220 mg) Oral Tablet Take 50 mg by mouth Once a day (Patient not taking: Reported on 06/20/2021)       No current facility-administered medications for this visit.       Allergies:  No Known Allergies    Past Surgical Hx:  Past Surgical History:   Procedure Laterality Date   . HX TONSILLECTOMY     . HX VENTILATORY TUBES     . MASTECTOMY, PARTIAL             Social Hx:  Social History     Socioeconomic History   . Marital status: Widowed     Spouse name: Not  on file   . Number of children: Not on file   . Years of education: Not on file   . Highest education level: Not on file   Occupational History   . Not on file   Tobacco Use   . Smoking status: Never   . Smokeless tobacco: Never   Vaping Use   . Vaping Use: Never used   Substance and Sexual Activity   . Alcohol use: Never   . Drug use: Never   . Sexual activity: Not on file   Other Topics Concern   . Not on file   Social History Narrative   . Not on file     Social Determinants of Health     Financial Resource Strain: Not on file   Food Insecurity: Not on file   Transportation Needs: Not on file   Physical Activity: Not on file   Stress: Not on file   Intimate Partner Violence: Not on file   Housing Stability: Not on file       Review of Systems:  Constitutional: negative for fevers, chills, sweats and fatigue  Eyes: negative for blurred vision or field defects  HEENT: negative for hearing loss  Respiratory: negative for cough and shortness of breath  Cardiovascular: negative for chest pain, negative for claudication  Gastrointestinal: negative for post prandial pain  Genitourinary: negative for dysuria, continues to  urinate  Musculoskeletal: As in HPI  Neurological: negative for unilateral weakness or monocular blindness  Integumentary: negative for wounds or ulcers    Objective:    PHYSICAL EXAM:  Vitals: BP 110/71   Pulse 74   Temp 35.9 C (96.6 F)   SpO2 98%       General: AA&O X3. Nontoxic.  Well developed and well nourished in no acute distress   HENT: Head is normocephalic, atraumatic   Eyes: Pupils equal and round and reactive to light; conjunctiva clear. Sclera without icterus; EOMO grossly intact.    Neck: Normal ROM, Supple, symmetrical  Lungs: Effort normal, clear to auscultation bilaterally.   Cardiovascular: Heart regular rate and rhythm, S1, S2 normal, no murmur, click, rub or gallop  Vascular:  no carotid bruits   Left brachial artery:  2+ (normal),  Right brachial artery:  2+ (normal),    Left radial artery:  2+ (normal),  Right radial artery:  2+ (normal),      Left femoral artery:  2+ (normal),  Right femoral artery:  2+ (normal),      Left popliteal artery: 2+ (normal), Right popliteal artery: 2+ (normal)   Left dorsalis pedis artery:  2+ (normal), Right dorsalis pedis artery:   2+ (normal),     Left posterior tibial artery: 2+ (normal) Right posterior tibeal artery:   2+ (normal)   Abdomen: Bowel sounds normal; soft, non distended non-tender to palpation, no rebound or guarding present. No palpable masses.  Extremities: no cyanosis or edema  Skin:  Skin warm and dry      DATA:     I have independently reviewed and interpreted available imaging.  My interpretations are summarized below.      DIAGNOSTIC STUDIES REVIEWED:  CT soft tissue neck with IV contrast 07/2020 with < 50% bilateral carotid stenosis    Assessment:  Mild Bilateral Carotid stenosis  Asymptomatic but remote history of CVA    Plan:  Carotid duplex surveillance in 6 months and if stable <50%, will need further FU with PCP  [id number] and statin per PCP  Patient was given the opportunity to ask questions and those questions were answered to  their satisfaction. Instructed to call with any further questions or concerns.     Reubin Milan, MD

## 2021-06-30 ENCOUNTER — Inpatient Hospital Stay
Admission: RE | Admit: 2021-06-30 | Discharge: 2021-06-30 | Disposition: A | Discharge status: Home / Self Care | Payer: Medicare Other | Source: Ambulatory Visit | Attending: VASCULAR SURGERY | Admitting: VASCULAR SURGERY

## 2021-06-30 ENCOUNTER — Other Ambulatory Visit: Payer: Self-pay

## 2021-06-30 DIAGNOSIS — I6529 Occlusion and stenosis of unspecified carotid artery: Secondary | ICD-10-CM | POA: Insufficient documentation

## 2021-07-01 DIAGNOSIS — I6523 Occlusion and stenosis of bilateral carotid arteries: Secondary | ICD-10-CM

## 2021-07-05 ENCOUNTER — Ambulatory Visit (HOSPITAL_COMMUNITY): Payer: Self-pay

## 2021-07-20 ENCOUNTER — Other Ambulatory Visit (HOSPITAL_COMMUNITY): Payer: Self-pay

## 2021-07-20 DIAGNOSIS — I6523 Occlusion and stenosis of bilateral carotid arteries: Secondary | ICD-10-CM

## 2021-08-16 ENCOUNTER — Ambulatory Visit: Payer: Medicare Other | Attending: Internal Medicine | Admitting: Internal Medicine

## 2021-08-16 ENCOUNTER — Other Ambulatory Visit: Payer: Self-pay

## 2021-08-16 ENCOUNTER — Inpatient Hospital Stay (HOSPITAL_BASED_OUTPATIENT_CLINIC_OR_DEPARTMENT_OTHER)
Admission: RE | Admit: 2021-08-16 | Discharge: 2021-08-16 | Disposition: A | Discharge status: Home / Self Care | Payer: Medicare Other | Source: Ambulatory Visit | Attending: Internal Medicine | Admitting: Internal Medicine

## 2021-08-16 ENCOUNTER — Encounter (HOSPITAL_BASED_OUTPATIENT_CLINIC_OR_DEPARTMENT_OTHER): Payer: Self-pay | Admitting: Internal Medicine

## 2021-08-16 VITALS — BP 115/54 | HR 53 | Temp 97.8°F | Resp 18 | Ht 63.0 in | Wt 162.7 lb

## 2021-08-16 DIAGNOSIS — N3010 Interstitial cystitis (chronic) without hematuria: Secondary | ICD-10-CM | POA: Insufficient documentation

## 2021-08-16 DIAGNOSIS — C8230 Follicular lymphoma grade IIIa, unspecified site: Secondary | ICD-10-CM

## 2021-08-16 DIAGNOSIS — Z9012 Acquired absence of left breast and nipple: Secondary | ICD-10-CM | POA: Insufficient documentation

## 2021-08-16 DIAGNOSIS — Z79899 Other long term (current) drug therapy: Secondary | ICD-10-CM | POA: Insufficient documentation

## 2021-08-16 DIAGNOSIS — Z809 Family history of malignant neoplasm, unspecified: Secondary | ICD-10-CM | POA: Insufficient documentation

## 2021-08-16 DIAGNOSIS — C8590 Non-Hodgkin lymphoma, unspecified, unspecified site: Secondary | ICD-10-CM

## 2021-08-16 DIAGNOSIS — Z86000 Personal history of in-situ neoplasm of breast: Secondary | ICD-10-CM | POA: Insufficient documentation

## 2021-08-16 DIAGNOSIS — Z8572 Personal history of non-Hodgkin lymphomas: Secondary | ICD-10-CM | POA: Insufficient documentation

## 2021-08-16 LAB — CBC WITH DIFF
BASOPHIL #: <0.1 10*3/uL (ref <=0.20)
BASOPHIL %: 1 %
EOSINOPHIL #: 0.17 10*3/uL (ref <=0.50)
EOSINOPHIL %: 3 %
HCT: 43.8 % (ref 34.8–46.0)
HGB: 15.1 g/dL (ref 11.5–16.0)
IMMATURE GRANULOCYTE #: <0.1 10*3/uL (ref <0.10)
IMMATURE GRANULOCYTE %: 0 % (ref 0–1)
LYMPHOCYTE #: 1.77 10*3/uL (ref 1.00–4.80)
LYMPHOCYTE %: 27 %
MCH: 29.1 pg (ref 26.0–32.0)
MCHC: 34.5 g/dL (ref 31.0–35.5)
MCV: 84.4 fL (ref 78.0–100.0)
MONOCYTE #: 0.39 10*3/uL (ref 0.20–1.10)
MONOCYTE %: 6 %
MPV: 10.6 fL (ref 8.7–12.5)
NEUTROPHIL #: 4.22 10*3/uL (ref 1.50–7.70)
NEUTROPHIL %: 63 %
PLATELETS: 178 10*3/uL (ref 150–400)
RBC: 5.19 10*6/uL (ref 3.85–5.22)
RDW-CV: 12.7 % (ref 11.5–15.5)
WBC: 6.6 10*3/uL (ref 3.7–11.0)

## 2021-08-16 LAB — COMPREHENSIVE METABOLIC PANEL, NON-FASTING
ALBUMIN: 4.2 g/dL (ref 3.4–4.8)
ALKALINE PHOSPHATASE: 99 U/L (ref 55–145)
ALT (SGPT): 21 U/L (ref 8–22)
ANION GAP: 9 mmol/L (ref 4–13)
AST (SGOT): 18 U/L (ref 8–45)
BILIRUBIN TOTAL: 1.9 mg/dL — ABNORMAL HIGH (ref 0.3–1.3)
BUN/CREA RATIO: 18 (ref 6–22)
BUN: 16 mg/dL (ref 8–25)
CALCIUM: 10.6 mg/dL — ABNORMAL HIGH (ref 8.8–10.2)
CHLORIDE: 107 mmol/L (ref 96–111)
CO2 TOTAL: 25 mmol/L (ref 23–31)
CREATININE: 0.9 mg/dL (ref 0.60–1.05)
ESTIMATED GFR: 65 mL/min/BSA (ref >=60)
GLUCOSE: 89 mg/dL (ref 65–125)
POTASSIUM: 3.6 mmol/L (ref 3.5–5.1)
PROTEIN TOTAL: 7.2 g/dL (ref 6.0–8.0)
SODIUM: 141 mmol/L (ref 136–145)

## 2021-08-16 LAB — PLATELETS AND ANC CANCER CENTER
NEUTROPHILS #: 4.22 10*3/uL (ref 1.50–7.70)
PLATELET COUNT: 178 10*3/uL (ref 150–400)

## 2021-08-16 LAB — LDH: LDH: 168 U/L (ref 125–220)

## 2021-08-16 NOTE — Progress Notes (Unsigned)
Patient Identifier:Connie Pineda  WL:[id number] for a reported TIA    HPI:  23 yr. Old rt handed female referred by her PCP for a reported TIA but does have a reported hx of stroke in 2021 and carotid stenosis. She actually saw our vascular surgery service on 1.9.23 for the reported carotid stenosis and in his note he made note of CT soft tissue neck with IV contrast 07/2020 with < 50% bilateral carotid stenosis and Carotid duplex surveillance in 6 months and if stable <50%, will need further FU with PCP. According to a note from [place] dated 8.22.22 it states that a month before the appt she had an episode of slurred speech and was "found to have a TIA." it states that she was evaluated and that for the most part imaging was negative and she was found to have 50 to 60% stenosis of her carotid artery. According to his referral note, she was on aspirin and atorvastatin.   She says two months ago she was at her senior living home and she was in the lunch line and all of a sudden she started sayings that did not make any sense and she went to [facility] and she is not sure what they did. She says she got her speech back to normal within 10 minutes. She does not recall any focal numbness or weakness. She has not had any further episodes since that and she has a physical therapist. She has not had any bad falls but does stumble easily. She is taking a baby aspirin once a day now but was not before and is taking a statin medication now. She has chronic dizziness.         PMH:  1.hx of patulous eustachian tube and has had bilateral T tubes and hx of perforation the left tympanic membrane and seen ENT here. Hx of hearing loss  2.mastectomy.   3.interstitinal cystitis  4.reported hx of stroke-she says she did not have a stroke before this.   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    10.HTN  11.HLD  [employer]tn rt sided carotid artery disease  13.GERD  14.osteopenia  15.nephrolithiasis  -she has never had a MI and has never been diagnosed with atrial fibrillation and flutter.     Medications:  Outpatient Medications Marked as Taking for the 08/17/21 encounter (Office Visit) with Vista Lawman, MD   Medication Sig   . amLODIPine (NORVASC) 2.5 mg Oral Tablet Take 1 Tablet (2.5 mg total) by mouth   . ascorbic acid (VITAMIN C ORAL) Take by mouth   . aspirin 81 mg Oral Capsule Take by mouth   . atorvastatin (LIPITOR) 20 mg Oral Tablet Take 2 Tablets (40 mg total) by mouth   . calcium carbonate/vitamin D3 (VITAMIN D-3 ORAL) Take by mouth   . clonazePAM (KLONOPIN) 1 mg Oral Tablet Take 1 Tablet (1 mg total) by mouth Twice daily   . esomeprazole magnesium (NEXIUM) 40 mg Oral Capsule, Delayed Release(E.C.) Take 1 Capsule (40 mg total) by mouth Every morning before breakfast   . metoprolol succinate (TOPROL-XL) 25 mg Oral Tablet Sustained Release 24 hr Take 1 Tablet (25 mg total) by mouth   . Pentosan Polysulfate Sodium (ELMIRON) 100 mg Oral Capsule TAKE ONE CAPSULE BY MOUTH 3 TIMES A DAY BEFORE MEALS   . sucralfate (CARAFATE) 1 gram Oral Tablet Take 1 Tablet (  1 g total) by mouth Three times daily before meals   . triamterene-hydroCHLOROthiazide (DYAZIDE) 37.5-25 mg Oral Capsule Take 1 Capsule by mouth Every morning   . zinc Sulfate (ZINCATE) 50 mg zinc (220 mg) Oral Tablet Take 1 Tablet (50 mg total) by mouth Once a day         Allergies:  No Known Allergies    SH:  -she lives at [employer] at [facility] and lives in her own apartment there.   -she no longer drives because she gave up her car.   -she has never been a smoker or consumed alcohol.     FH:  -father had strokes and MI's.     Pertinent ROS:  General:  Skin:no sig bruising on the aspirin.   HEENT:no sig headaches. She does not recall any acute vision issues and [facility]   CV:she says that she has had  abnormal sensation straight down her chest and will feel a pain. She says she wore a heart monitor that she thinks was through [place].  Resp:  GI:no bloody stools.   GU/Reproductive:  Musculoskeletal:  Endocrine:denies a hx of diabetes.   Hematology:she denies any severe bleeding on the aspirin.     Physical Exam:  Vitals:P: 68 BP: 128/80 WT: 73.8kg    General:elderly female in no sig distress.   HEENT:normocephalic.   TK:WIOXB bradycardia. No carotid bruits bilaterally.   Resp:good air movement bilaterally.     Neurological examination:  Mentation:awake, alert, and orientedx3 specifically yr, month and day of the week. Speech fluent.   Cranial:visual fields grossly intact. PERRL. EOMI w.o appreciable nystagmus. Intact sensation in v1-v3 bilaterally w.o extinction. Facial movements including brow raising and smile symmetrical. Hearing grossly intact. Soft palate and Uvula midline. Shoulder shrug normal. Tongue midline.   Fundoscopic:   Motor:normal tone throughout all extremities. No appreciable drift in upper exts. 5/5 strength in upper extremities bilaterally. 5/5 strength in lower extremities bilaterally.   Reflexes:2+patellars bilaterally (brisk). 2+biceps brachii bilaterally(brisk). Hoffman not present bilaterally.   Sensory:intact sensation to light touch from face to lower extremities bilaterally w.o extinction.   Coordination/Cerebllar Testing:FTN smooth bilaterally.   Gait:Rhomberg not present. Normal and Narrow based gait but stumbled slightly when she turned.       Labs/Diagnostics/Imaging:    CT soft tissue neck with contrast 2.28.22:  Atherosclerotic disease, with likely moderate narrowing at the right ICA origin.    Multilevel cervical spondylosis. Bony demineralization.    IMPRESSION:  No suspicious lymph nodes by CT criteria within the neck.    Carotid doppler dated 1.19.23:  Conclusions: No significant bilateral carotid stenosis. Bilateral vertebral arteries patent and  antegrade.      Impression/Plan:  66 yr. Old female referred to me today for a reported hx of stroke with concern for a TIA in July of 2022 who after neurological evaluation with that being a possibility versus a metabolic cause (difficult for me to draw a conclusion since I do not have all the records from [facility]):    1.reported TIA but still not clear to me since I do not have all the records:  Work up: I have asked my staff to get the work up she had done at [facility] including the MRI report and her heart monitor she had done. I did order a B12, TSH, and Folate today.   Tx: she is currently on aspirin and as statin and I did not change this.  Education: Please note that I specifically went over signs of a stroke including but not limited to facial tingling or dropping, sudden onset of arm or leg weakness, acute visual loss, speech difficulties such as not being able to speak or slurred speech. I specifically told patient if they were to ever experience this suddenly they should call 911 immediately. I also told her if she ever fell and hit her head she needs to go to Ed immediately.   Fx up in 7 months.           Total time by staff: __40__ minutes. Greater than 50% of that time was spent on counseling/coordination of care regarding: _____as above_____               Thank you.

## 2021-08-17 ENCOUNTER — Ambulatory Visit: Payer: Medicare Other | Attending: Internal Medicine | Admitting: Internal Medicine

## 2021-08-17 ENCOUNTER — Encounter (HOSPITAL_BASED_OUTPATIENT_CLINIC_OR_DEPARTMENT_OTHER): Payer: Self-pay | Admitting: Internal Medicine

## 2021-08-17 ENCOUNTER — Other Ambulatory Visit (INDEPENDENT_AMBULATORY_CARE_PROVIDER_SITE_OTHER): Payer: Medicare Other | Admitting: Rheumatology

## 2021-08-17 DIAGNOSIS — G459 Transient cerebral ischemic attack, unspecified: Secondary | ICD-10-CM

## 2021-08-17 DIAGNOSIS — Z8673 Personal history of transient ischemic attack (TIA), and cerebral infarction without residual deficits: Secondary | ICD-10-CM

## 2021-08-17 DIAGNOSIS — R531 Weakness: Secondary | ICD-10-CM

## 2021-08-17 DIAGNOSIS — R42 Dizziness and giddiness: Secondary | ICD-10-CM

## 2021-08-17 LAB — THYROID STIMULATING HORMONE WITH FREE T4 REFLEX: TSH: 1.826 u[IU]/mL (ref 0.430–3.550)

## 2021-08-17 LAB — VITAMIN B12: VITAMIN B 12: 407 pg/mL (ref 200–900)

## 2021-08-17 LAB — FOLATE: FOLATE: 7.6 ng/mL (ref 7.0–31.0)

## 2021-08-17 NOTE — Cancer Center Note (Signed)
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     - 03/08/18:  CT neck revealed abnormal enlargement of nasopharyngeal soft tissues and pathological lymphadenopathy in the neck.   - 03/22/18: right cervical lymph node and nasopharyngeal biopsy confirmed follicular lymphoma, grade 3A.  - 04/17/18:  PET-CT revealed notable bilateral adenopathy in the neck and posterior nasopharynx, hypermetabolic consistent with malignancy.  No pathologically enlarged or significantly hypermetabolic adenopathy in the chest, abdomen or pelvis.  No splenomegaly.   - 11/19-2/21/20:  Status post cycle 1 of Rituxan alone, then 3 cycles of combined bendamustine and Rituximab completed by 08/02/18.  Bendamustine dose reduced to 70 milligrams/meter squared for interstitial cystitis.  - 08/22/18:  PET-CT revealed interval resolution of bilateral hypermetabolic cervical and posterior nasopharyngeal adenopathy.  No evidence for residual or recurrent metabolically active tumor.  - 08/2018:  Maintenance treatment with rituximab was discussed by the primary oncologist, however given her concerns with interstitial cystitis and  memory issues, shared decision was made to hold off on maintenance treatment and pursue watchful observation.  - 10/30/18:  CT neck revealed interval resolution of nasopharyngeal mass and cervical adenopathy bilaterally.  No residual mass or adenopathy.  - 06/03/19:  PET CT showed no evidence of recurrence of lymphoma.    Interim History:  Patient denies fevers, unintentional weight loss, night sweats, enlarged lymph  nodes, abdominal pain, n/v/d. She is still have issues with her ears, noted to have patulous eustachian tube. She went to [facility] and they agreed. She states she was also referred to [facility] but does not wish to go for further referral. She is currently residing in a senior/assisted living.      REVIEW OF SYSTEMS:   All systems reviewed and are negative except for what is mentioned in HPI    Past Medical History:   Diagnosis Date    Cancer (CMS [place])     CVA (cerebrovascular accident) (CMS [place])     Hearing loss     Heart disease      Past Surgical History:   Procedure Laterality Date    HX TONSILLECTOMY      HX VENTILATORY TUBES      MASTECTOMY, PARTIAL       Social History     Socioeconomic History    Marital status: Widowed     Spouse name: Not on file    Number of children: Not on file    Years of education: Not on file    Highest education level: Not on file   Occupational History    Not on file   Tobacco Use    Smoking status: Never    Smokeless tobacco: Never   Vaping Use    Vaping Use: Never used   Substance and Sexual Activity    Alcohol use: Never    Drug use: Never    Sexual activity: Not on file   Other Topics Concern    Not on file   Social History Narrative    Not on file  Social Determinants of Health     Financial Resource Strain: Not on file   Transportation Needs: Not on file   Social Connections: Not on file   Intimate Partner Violence: Not on file   Housing Stability: Not on file     Family Medical History:       Problem Relation (Age of Onset)    Heart Disease Father    Thyroid Cancer Mother          PHYSICAL EXAM:  Most Recent [place] Office Visit from 08/16/2021 in Hematology/Oncology, [facility]   Temperature 36.6 C (97.8 F) filed at... 08/16/2021 1335   Heart Rate 53 filed at... 08/16/2021 1335   Respiratory Rate 18 filed at... 08/16/2021 1335   BP (Non-Invasive) 115/54  [MD notified] filed at... 08/16/2021 1335   SpO2 99 % filed  at... 08/16/2021 1335   Height 1.6 m ('5\' 3"'$ ) filed at... 08/16/2021 1335   Weight 73.8 kg (162 lb 11.2 oz) filed at... 08/16/2021 1335   BMI (Calculated) 28.88 filed at... 08/16/2021 1335   BSA (Calculated) 1.81 filed at... 08/16/2021 1335   ECOG: (1) Restricted in physically strenuous activity, ambulatory and able to do work of light nature  General: well appearing elderly female in no distress. Hard of hearing  Eyes: EOMI, anicteric sclera  HENT: Head NCAT. mucous membranes moist.  Neck: supple  Lungs: clear to auscultation bilaterally.   Cardiovascular: RRR, no m/r/g  Abdomen: soft, non-tender, no HSM  Extremities: no edema  Skin: Skin warm and dry  Lymphatics: no palpable lymphadenopathy  Neurologic: AAOx3, no focal deficits  Psychiatric: affect normal    LABS  COMPLETE BLOOD COUNT   Lab Results   Component Value Date    WBC 6.6 08/16/2021    HGB 15.1 08/16/2021    HCT 43.8 08/16/2021    PLTCNT 178 08/16/2021    PLTCNT 178 08/16/2021     DIFFERENTIAL  Lab Results   Component Value Date    PMNS 63 08/16/2021    MONOCYTES 6 08/16/2021    BASOPHILS 1 08/16/2021    BASOPHILS <0.10 08/16/2021    PMNABS 4.22 08/16/2021    PMNABS 4.22 08/16/2021    LYMPHSABS 1.77 08/16/2021    EOSABS 0.17 08/16/2021    MONOSABS 0.39 08/16/2021     COMPREHENSIVE METABOLIC PANEL - NON FASTING  Lab Results   Component Value Date    SODIUM 141 08/16/2021    POTASSIUM 3.6 08/16/2021    CHLORIDE 107 08/16/2021    CO2 25 08/16/2021    ANIONGAP 9 08/16/2021    BUN 16 08/16/2021    CREATININE 0.90 08/16/2021    GLUCOSENF 89 08/16/2021    CALCIUM 10.6 (H) 08/16/2021    ALBUMIN 4.2 08/16/2021    TOTALPROTEIN 7.2 08/16/2021    ALKPHOS 99 08/16/2021    AST 18 08/16/2021    ALT 21 08/16/2021       IMAGING:  CT neck 08/09/20 reviewed by me, no abnormal lymph nodes noted  IMPRESSION:  No suspicious lymph nodes by CT criteria within the neck.       PATHOLOGY:  D.  LYMPH NODE, RIGHT NECK, BIOPSY (OSS, [id number], 14 slides, [facility]; obtained  67/67/2094):    - Follicular lymphoma, grade 3a, follicular pattern.  (See comment)     E.  NASOPHARYNX, BIOPSY (OSS, [id number], 14 slides, [facility]; obtained 70/96/2836):    - Follicular lymphoma, grade 3a, follicular pattern.  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     1. Follicular lymphoma (grade 3a)  - Diagnosed 03/2018 on right neck lymph node and nasopharyngeal biopsies  - s/p 4 cycles of Rituximab, 3 cycles including dose reduced dose Bendamustine.  - End of treatment imaging showed complete response.  - Maintenance treatment with rituximab was discussed by the primary oncologist, however given her concerns with interstitial cystitis and  memory issues, shared decision was made to hold off on maintenance Rituximab and pursue watchful observation.   - Last PET-CT was performed in 05/2019 which showed no evidence of recurrence  - Today we discussed a normal healthy blood system and the derangements that occur in follicular lymphoma.   - Discussed the excellent response she had to the treatment and plan to continue surveillance. CT neck without evidence of disease.   - Reassured the patient that her ear issues are not secondary to lymphoma.    2.Patulous eustachian tube, history of b/l tympanostomy tube placement:  - following with ENT. Went to [facility] and the diagnosis was the same.     3. Chronic interstitial cystitis:  - On Elmiron  - following with urology    Disposition:  RTC 6 months with HM1.    I independently of the faculty provider spent a total of 18 minutes in direct/indirect care of this patient including initial evaluation, review of laboratory, radiology, diagnostic studies, review of medical record, order entry and coordination of care.    Patient seen with  co-signing faculty, Dr. Harrington Challenger, in clinic.     Meta Hatchet, APRN   Section of Hematology/Oncology   [facility]      I personally saw and evaluated the patient as part of a shared service with an APP.    My substantive findings are:  MDM (complete) Follicular lymphoma on surveillance, doing well, no evidence of relapse.    I independently of the APP spent a total of (19) minutes in direct/indirect care of this patient including initial evaluation, review of laboratory, radiology, diagnostic studies, review of medical record, order entry and coordination of care.     Katheren Puller, MD  08/18/2021, 13:17

## 2021-08-23 ENCOUNTER — Encounter (HOSPITAL_BASED_OUTPATIENT_CLINIC_OR_DEPARTMENT_OTHER): Payer: Self-pay | Admitting: Internal Medicine

## 2021-08-23 ENCOUNTER — Ambulatory Visit (HOSPITAL_BASED_OUTPATIENT_CLINIC_OR_DEPARTMENT_OTHER): Payer: Self-pay

## 2021-08-24 ENCOUNTER — Ambulatory Visit (INDEPENDENT_AMBULATORY_CARE_PROVIDER_SITE_OTHER): Payer: Self-pay | Admitting: Internal Medicine

## 2021-08-24 NOTE — Telephone Encounter (Addendum)
I called pt and she said she was a bit concerned because she has the 2 blood clots in her carotid and wanted to call to speak with you.    She said this morning she took tylenol.  Said that the pain comes and goes and only is present when she coughs and it radiates from above her eye down her cheek.     No numbness, visual problems       Regarding: [place]  ----- Message from Ramond Marrow, [place] sent at 08/24/2021  1:41 PM EDT -----  Pt states shes never had a head ache in her life, but today shes having one - and when she coughs she can feel it comes down her right eye, and right side of her face. Not for long. She's never experienced anything like this. She wants to know if she needs to be seen or go to bed and forget about it. Please advise.

## 2021-08-25 NOTE — Telephone Encounter (Signed)
Connie Lawman, MD  I called her back today at 3.16.23 at 8:57 and she said she noticed that when she coughed she would have a pain that would go down the right side of the face and across the eye. She says it has improved but still can feel it when coughs. She has not had any visual issues with this. I told her if she had any visual issues or stroke symptoms to go to ED.       Thanks,     [employer]

## 2021-09-01 ENCOUNTER — Ambulatory Visit (HOSPITAL_BASED_OUTPATIENT_CLINIC_OR_DEPARTMENT_OTHER): Payer: Medicare Other | Admitting: Audiologist

## 2021-09-01 ENCOUNTER — Other Ambulatory Visit: Payer: Self-pay

## 2021-09-01 ENCOUNTER — Ambulatory Visit: Payer: Medicare Other | Attending: Otolaryngology | Admitting: Otolaryngology

## 2021-09-01 VITALS — BP 142/76 | HR 74 | Temp 96.3°F | Ht 63.0 in | Wt 165.6 lb

## 2021-09-01 DIAGNOSIS — H903 Sensorineural hearing loss, bilateral: Secondary | ICD-10-CM | POA: Insufficient documentation

## 2021-09-01 DIAGNOSIS — H6900 Patulous Eustachian tube, unspecified ear: Secondary | ICD-10-CM | POA: Insufficient documentation

## 2021-09-01 DIAGNOSIS — H9190 Unspecified hearing loss, unspecified ear: Secondary | ICD-10-CM

## 2021-09-01 NOTE — Progress Notes (Signed)
[facility] PROGRESS NOTE    Name: Connie Pineda, 81 y.o. female  MRN: [id number]  Date of Birth: July 30, 1940  Date of Service: 09/01/2021    Chief Complaint:    Chief Complaint   Patient presents with    Ear Problem(s)       Subjective: Connie Pineda is a 81 y.o. female returning for patulous eustachian tube. She has bilateral T tubes placed at [facility] in 2020. Previous workup with cVEMP and CT IAMs was negative for SSCD. She previously tried Patulin for 1.5 mo that helped slightly but is no longer using it. She was last seen on 05/26/21 and had her left VT removed and underwent paper patch myringoplasty. Plan was to use sweet oil for right VT crusting.    Today, she continues to endorse aural fullness that is constant. She used the sweet oil drops occasionally. She denies tinnitus, dizziness, or otorrhea. She reports subjective hearing loss since her last visit.    Review of Information:  Data Reviewed:  CT IAMS 07/08/2020: bilateral tympanostomy tubes in place. Air visualized within the expected course of the ET's bilaterally suggesting possible patulous ET's. No evidence of SSCD     cVEMP 07/08/2020: cVEMP waveforms were present and repeatable bilaterally in response to a 105 dB 500 Hz toneburst stimuli. cVEMP threshold was elevated in the right ear and normal for the left ear. Interaural latency difference was normal. Interamplitude difference was 54% which is abnormal. cVEMP is not suggestive of a superior canal dehiscence in either ear.     Audiogram was performed 05/20/2020. SRT on the right was 15 dB, and SRT on the left was 15 dB. Word recognition on the right was 96% at 55 dB, and word recognition on the left was 96% at 55 dB.   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  Left - Normal hearing through 3000 Hz with a mild to moderately-severe SNHL in the remaining  frequencies.    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  Left Ear: Speech understanding at conversational levels was  92% in quiet.     Past Medical History:  Past Medical History:   Diagnosis Date    Cancer (CMS [place])     CVA (cerebrovascular accident) (CMS [place])     Hearing loss     Heart disease        ROS:   ROS negative except per HPI    Physical Exam:  BP (Non-Invasive): (!) 142/76 Temperature: 35.7 C (96.3 F) Heart Rate: 74      Height: 160 cm ('5\' 3"'$ ) Weight: 75.1 kg (165 lb 9.1 oz) Body mass index is 29.33 kg/m.    General Appearance: Well-appearing 81 y.o. female who is pleasant, cooperative and in no acute distress.  Head: Normocephalic.  Face: Symmetric, and without obvious lesions.   Eyes: Conjunctivae clear, pupils equal and round.    Binocular microscopy was used at today's visit for diagnostic and treatment purposes  Right ear   Pinna:  moderately narrow with cerumen which was removed  External auditory canal: Patent and without inflammation.   Tympanic membrane:   Intact, translucent, midposition, with good middle ear aeration. Tube in place, surrounded by wax.    Left ear   Pinna: moderately narrow with cerumen which was removed   External auditory canal: Patent and without inflammation.   Tympanic membrane:   Intact, translucent, midposition, with good middle ear aeration.    Hypopharynx/Larynx: Voice normal.  Neurologic: Grossly normal. Cranial nerves II-XII grossly intact bilaterally.     Special Procedures:  ENT, [place]  [place] [address]  Operated by [facility]  Procedure Note    Name: Devanie Galanti  MRN:  [id number]   Date: 09/01/2021 Age: 81 y.o.       Binocular Microscopy  Performed by: Ponciano Ort, MD  Authorized by: Roslyn Smiling Noah Charon, MD       Binocular microscopy necessary on exam today for diagnostic and/or treatment purposes; See Clinic Physical Exam      Ponciano Ort, MD      Assessment/Plan      ICD-10-CM    1. Patulous Eustachian tube, unspecified laterality  H69.00 AUDIOLOGY EVALUATION(REF AUDIOLOGY)-POC     Binocular Microscopy      2. Subjective hearing loss  H91.90            Connie Pineda is a 81 y.o. female who presents with patulous eustachian tubes. No significant changes on audiogram today. Left tube removed last visit with paper patch myringoplasty.    - Consented in clinic for right VT removal and paper patch myringoplasty in OR as patient did not tolerate manipulation in clinic.      Gennie Alma. Willa Frater, MD 09/01/2021 10:52   Otolaryngology, PGY-1        I saw and examined the patient.  I reviewed the resident's note.  I agree with the findings and plan of care as documented in the resident's note.  Any exceptions/additions are edited/noted.    [name], MD      CC:    PCP Elgie Collard, MD  [address]  [place]  [id number]   Referring Provider Self, Referral  No address on file

## 2021-09-01 NOTE — Procedures (Signed)
ENT, [place]  [place] [address]  Operated by [facility]  Procedure Note    Name: Connie Pineda MRN:  [id number]   Date: 09/01/2021 Age: 81 y.o.       Binocular Microscopy  Performed by: Ponciano Ort, MD  Authorized by: Roslyn Smiling Noah Charon, MD       Binocular microscopy necessary on exam today for diagnostic and/or treatment purposes; See Clinic Physical Exam      Ponciano Ort, MD      I was present and supervised/observed the entire procedure.  [name], MD

## 2021-09-01 NOTE — Progress Notes (Signed)
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  Left Ear: Speech understanding at conversational levels was  92% in quiet.   Recommendations: ENT evaluation/follow-up  Retest as needed, Amplification if interested pending medical clearance

## 2021-09-06 ENCOUNTER — Encounter (INDEPENDENT_AMBULATORY_CARE_PROVIDER_SITE_OTHER): Payer: Self-pay | Admitting: Otolaryngology

## 2021-09-06 ENCOUNTER — Encounter (INDEPENDENT_AMBULATORY_CARE_PROVIDER_SITE_OTHER): Payer: Self-pay

## 2021-09-09 ENCOUNTER — Inpatient Hospital Stay
Admission: RE | Admit: 2021-09-09 | Discharge: 2021-09-09 | Disposition: A | Discharge status: Home / Self Care | Payer: Medicare Other | Source: Ambulatory Visit | Attending: Family | Admitting: Family

## 2021-09-09 ENCOUNTER — Other Ambulatory Visit: Payer: Self-pay

## 2021-09-09 ENCOUNTER — Inpatient Hospital Stay (HOSPITAL_COMMUNITY)
Admission: RE | Admit: 2021-09-09 | Discharge: 2021-09-09 | Disposition: A | Discharge status: Home / Self Care | Payer: Medicare Other | Source: Ambulatory Visit

## 2021-09-09 ENCOUNTER — Encounter (HOSPITAL_COMMUNITY): Payer: Self-pay

## 2021-09-09 VITALS — BP 106/49 | HR 60 | Temp 97.2°F | Resp 18 | Ht 63.7 in | Wt 168.0 lb

## 2021-09-09 DIAGNOSIS — Z01818 Encounter for other preprocedural examination: Secondary | ICD-10-CM | POA: Insufficient documentation

## 2021-09-09 HISTORY — DX: Transient cerebral ischemic attack, unspecified: G45.9

## 2021-09-09 HISTORY — DX: Calculus of kidney: N20.0

## 2021-09-09 HISTORY — DX: Hyperlipidemia, unspecified: E78.5

## 2021-09-09 HISTORY — DX: Gastro-esophageal reflux disease without esophagitis: K21.9

## 2021-09-09 HISTORY — DX: Personal history of other diseases of urinary system: Z87.448

## 2021-09-09 HISTORY — DX: Essential (primary) hypertension: I10

## 2021-09-09 HISTORY — DX: Spontaneous ecchymoses: R23.3

## 2021-09-09 LAB — ECG 12-LEAD (PERFORMED IN PREADMISSION UNIT ONLY)
Atrial Rate: 57 {beats}/min
Calculated P Axis: 64 degrees
Calculated R Axis: 12 degrees
Calculated T Axis: 25 degrees
PR Interval: 140 ms
QRS Duration: 76 ms
QT Interval: 434 ms
QTC Calculation: 422 ms
Ventricular rate: 57 {beats}/min

## 2021-09-09 LAB — POC BLOOD GLUCOSE (RESULTS): GLUCOSE, POC: 154 mg/dL — ABNORMAL HIGH (ref 70–105)

## 2021-09-09 NOTE — Anesthesia Preprocedure Evaluation (Addendum)
ANESTHESIA PRE-OP EVALUATION  Connie Pineda  Planned Procedure: REMOVAL FOREIGN BODY EAR (Right: Ear)  MYRINGOPLASTY WITH PAPER PATCH (Right)  Review of Systems     anesthesia history negative               Pulmonary  negative pulmonary ROS,    Cardiovascular    Hypertension and hyperlipidemia , Exercise Tolerance: > or = 4 METS        GI/Hepatic/Renal    GERD, well controlled and kidney stones        Endo/Other   neg endo/other ROS,       Neuro/Psych/MS    TIA     Cancer                      Physical Assessment      Airway       Mallampati: II    TM distance: >3 FB    Neck ROM: full  Mouth Opening: good.      No endotracheal tube present  No Tracheostomy present    Dental       Dentition intact             Pulmonary    Breath sounds clear to auscultation  (-) no rhonchi, no decreased breath sounds, no wheezes, no rales and no stridor     Cardiovascular    Rhythm: regular  Rate: Normal  (-) no friction rub and no murmur     Other findings            Plan  ASA 3     Planned anesthesia type: general                                                             EKG Ordered: 09/09/2021  CXR: Not ordered  Other Studies: Labs in Epic   Latest Reference Range & Units 08/16/21 13:41   WBC 3.7 - 11.0 x10^3/uL 6.6   HGB 11.5 - 16.0 g/dL 15.1   HCT 34.8 - 46.0 % 43.8   PLATELET COUNT (AUTO) 150 - 400 x10^3/uL 178   PLATELET COUNT 150 - 400 x10^3/uL 178   RBC 3.85 - 5.22 x10^6/uL 5.19   MCV 78.0 - 100.0 fL 84.4   MCHC 31.0 - 35.5 g/dL 34.5   MCH 26.0 - 32.0 pg 29.1   RDW-CV 11.5 - 15.5 % 12.7   MPV 8.7 - 12.5 fL 10.6   PMN'S % 63   LYMPHOCYTES % 27   EOSINOPHIL % 3   MONOCYTES % 6   BASOPHILS % 1   IMMATURE GRANULOCYTE % 0 - 1 % 0   IMMATURE GRANULOCYTE # <0.10 x10^3/uL <0.10   PMN ABS (AUTO) 1.50 - 7.70 x10^3/uL 4.22   PMN ABS 1.50 - 7.70 x10^3/uL 4.22   LYMPHS ABS 1.00 - 4.80 x10^3/uL 1.77   EOS ABS <=0.50 x10^3/uL 0.17   MONOS ABS 0.20 - 1.10 x10^3/uL 0.39   BASOS ABS <=0.20 x10^3/uL <0.10   SODIUM 136 - 145 mmol/L 141   POTASSIUM  3.5 - 5.1 mmol/L 3.6   CHLORIDE 96 - 111 mmol/L 107   CARBON DIOXIDE 23 - 31 mmol/L 25   BUN 8 - 25 mg/dL 16   CREATININE 0.60 - 1.05 mg/dL 0.90   GLUCOSE  65 - 125 mg/dL 89   ANION GAP 4 - 13 mmol/L 9   BUN/CREAT RATIO 6 - 22  18   ESTIMATED GLOMERULAR FILTRATION RATE >=60 mL/min/BSA 65   CALCIUM 8.8 - 10.2 mg/dL 10.6 (H)   TOTAL PROTEIN 6.0 - 8.0 g/dL 7.2   ALBUMIN 3.4 - 4.8 g/dL  4.2   BILIRUBIN, TOTAL 0.3 - 1.3 mg/dL 1.9 (H)   AST (SGOT) 8 - 45 U/L 18   ALT (SGPT) 8 - 22 U/L 21   ALKALINE PHOSPHATASE 55 - 145 U/L 99   LDH 125 - 220 U/L 168   (H): Data is abnormally high        07/07/16 echo - Summary   Normal left ventricle size and systolic function, with no segmental   abnormality.   Trace mitral regurgitation   There is mild aortic sclerosis noted, with no evidence of stenosis.   No significant valvular abnormalites noted         06/30/21 carotid duplex - Summary    Procedure: A duplex ultrasound examination of the extracranial carotid arteries was performed using 2D imaging with color and spectral Doppler for evaluation of atherosclerotic disease.  Bilateral: Right ICA: Less than 50% stenosis - MildRight ECA: Less  than 50% diameter reductionRight Vertebral: Normal, AntegradeLeft ICA: Less than 50% stenosis - MildLeft ECA: Less  than 50% diameter reductionLeft Vertebral: Normal, Antegrade  Conclusions: No significant bilateral carotid stenosis. Bilateral vertebral arteries patent and antegrade.      Consults: None    Patient instructed to hold vitamins, supplements, and NSAIDs 7 days prior to surgery; continuing aspirin per service; holding dyazide am of surgery; taking Norvasc, klonopin, Nexium, Toprol, aspirin, elmiron am of surgery.    Copy of Anesthesia Consent given to patient.  Patient instructed to read over consent and to bring any questions for AM of surgery.

## 2021-09-13 ENCOUNTER — Observation Stay (HOSPITAL_COMMUNITY): Payer: Medicare Other | Admitting: Otolaryngology

## 2021-09-13 ENCOUNTER — Other Ambulatory Visit: Payer: Self-pay

## 2021-09-13 ENCOUNTER — Observation Stay
Admission: RE | Admit: 2021-09-13 | Discharge: 2021-09-14 | Disposition: A | Discharge status: Home / Self Care | Payer: Medicare Other | Source: Ambulatory Visit | Attending: Otolaryngology | Admitting: Otolaryngology

## 2021-09-13 ENCOUNTER — Ambulatory Visit (HOSPITAL_BASED_OUTPATIENT_CLINIC_OR_DEPARTMENT_OTHER): Payer: Medicare Other | Admitting: Anesthesiology

## 2021-09-13 ENCOUNTER — Encounter (HOSPITAL_COMMUNITY)
Admission: RE | Disposition: A | Discharge status: Home / Self Care | Payer: Self-pay | Source: Ambulatory Visit | Attending: Otolaryngology

## 2021-09-13 ENCOUNTER — Ambulatory Visit (HOSPITAL_COMMUNITY): Payer: Medicare Other | Admitting: Family

## 2021-09-13 ENCOUNTER — Encounter (HOSPITAL_COMMUNITY): Payer: Self-pay | Admitting: Otolaryngology

## 2021-09-13 DIAGNOSIS — Z4803 Encounter for change or removal of drains: Secondary | ICD-10-CM

## 2021-09-13 DIAGNOSIS — K219 Gastro-esophageal reflux disease without esophagitis: Secondary | ICD-10-CM | POA: Insufficient documentation

## 2021-09-13 DIAGNOSIS — I10 Essential (primary) hypertension: Secondary | ICD-10-CM | POA: Insufficient documentation

## 2021-09-13 DIAGNOSIS — H7291 Unspecified perforation of tympanic membrane, right ear: Secondary | ICD-10-CM

## 2021-09-13 DIAGNOSIS — Z7982 Long term (current) use of aspirin: Secondary | ICD-10-CM | POA: Insufficient documentation

## 2021-09-13 DIAGNOSIS — Z79899 Other long term (current) drug therapy: Secondary | ICD-10-CM | POA: Insufficient documentation

## 2021-09-13 DIAGNOSIS — Z4889 Encounter for other specified surgical aftercare: Secondary | ICD-10-CM | POA: Diagnosis present

## 2021-09-13 DIAGNOSIS — Z8673 Personal history of transient ischemic attack (TIA), and cerebral infarction without residual deficits: Secondary | ICD-10-CM | POA: Insufficient documentation

## 2021-09-13 DIAGNOSIS — Z4582 Encounter for adjustment or removal of myringotomy device (stent) (tube): Principal | ICD-10-CM | POA: Insufficient documentation

## 2021-09-13 DIAGNOSIS — E785 Hyperlipidemia, unspecified: Secondary | ICD-10-CM | POA: Insufficient documentation

## 2021-09-13 SURGERY — REMOVAL FOREIGN BODY EAR
Anesthesia: General | Site: Ear | Laterality: Right | Wound class: Clean Contaminated Wounds-The respiratory, GI, Genital, or urinary

## 2021-09-13 MED ORDER — ZINC SULFATE 50 MG ZINC (220 MG) CAPSULE
50.0000 mg | ORAL_CAPSULE | Freq: Every day | ORAL | Status: DC
Start: 2021-09-14 — End: 2021-09-14
  Administered 2021-09-14: 50 mg via ORAL
  Filled 2021-09-13: qty 1

## 2021-09-13 MED ORDER — SODIUM CHLORIDE 0.9% FLUSH BAG - 250 ML
INTRAVENOUS | Status: DC | PRN
Start: 2021-09-13 — End: 2021-09-13

## 2021-09-13 MED ORDER — TRIAMTERENE 37.5 MG-HYDROCHLOROTHIAZIDE 25 MG CAPSULE
1.0000 | ORAL_CAPSULE | Freq: Every morning | ORAL | Status: DC
Start: 2021-09-14 — End: 2021-09-14
  Administered 2021-09-14: 1 via ORAL
  Filled 2021-09-13: qty 1

## 2021-09-13 MED ORDER — MELATONIN 3 MG TABLET
6.0000 mg | ORAL_TABLET | Freq: Every evening | ORAL | Status: DC
Start: 2021-09-14 — End: 2021-09-14
  Administered 2021-09-14: 6 mg via ORAL
  Filled 2021-09-13: qty 2

## 2021-09-13 MED ORDER — SURGIFOAM SIZE 100 CM SPONGE
VAGINAL_SPONGE | Freq: Once | INTRAMUSCULAR | Status: DC | PRN
Start: 2021-09-13 — End: 2021-09-13

## 2021-09-13 MED ORDER — PENTOSAN POLYSULFATE SODIUM 100 MG CAPSULE
100.0000 mg | ORAL_CAPSULE | Freq: Three times a day (TID) | ORAL | Status: DC
Start: 2021-09-13 — End: 2021-09-14
  Administered 2021-09-13: 0 mg via ORAL

## 2021-09-13 MED ORDER — OXYMETAZOLINE 0.05 % NASAL SPRAY
NASAL | Status: AC
Start: 2021-09-13 — End: 2021-09-13
  Filled 2021-09-13: qty 60

## 2021-09-13 MED ORDER — METOPROLOL SUCCINATE ER 25 MG TABLET,EXTENDED RELEASE 24 HR
25.0000 mg | ORAL_TABLET | Freq: Every day | ORAL | Status: DC
Start: 2021-09-14 — End: 2021-09-14
  Administered 2021-09-14: 25 mg via ORAL
  Filled 2021-09-13: qty 1

## 2021-09-13 MED ORDER — SURGIFOAM SIZE 100 CM SPONGE
VAGINAL_SPONGE | Freq: Once | INTRAVENOUS | Status: DC | PRN
Start: 2021-09-13 — End: 2021-09-13

## 2021-09-13 MED ORDER — LIDOCAINE (PF) 100 MG/5 ML (2 %) INTRAVENOUS SYRINGE
INJECTION | Freq: Once | INTRAVENOUS | Status: DC | PRN
Start: 2021-09-13 — End: 2021-09-13
  Administered 2021-09-13: 50 mg via INTRAVENOUS

## 2021-09-13 MED ORDER — FENTANYL (PF) 50 MCG/ML INJECTION SOLUTION
INTRAMUSCULAR | Status: AC
Start: 2021-09-13 — End: 2021-09-13
  Filled 2021-09-13: qty 2

## 2021-09-13 MED ORDER — ATORVASTATIN 40 MG TABLET
40.0000 mg | ORAL_TABLET | Freq: Every evening | ORAL | Status: DC
Start: 2021-09-13 — End: 2021-09-14
  Administered 2021-09-13: 40 mg via ORAL
  Filled 2021-09-13: qty 1

## 2021-09-13 MED ORDER — AMLODIPINE 5 MG TABLET
2.5000 mg | ORAL_TABLET | Freq: Every day | ORAL | Status: DC
Start: 2021-09-14 — End: 2021-09-14
  Administered 2021-09-14: 2.5 mg via ORAL
  Filled 2021-09-13: qty 1

## 2021-09-13 MED ORDER — SODIUM CHLORIDE 0.9 % (FLUSH) INJECTION SYRINGE
2.0000 mL | INJECTION | INTRAMUSCULAR | Status: DC | PRN
Start: 2021-09-13 — End: 2021-09-14

## 2021-09-13 MED ORDER — DEXTROSE 5% IN WATER (D5W) FLUSH BAG - 250 ML
INTRAVENOUS | Status: DC | PRN
Start: 2021-09-13 — End: 2021-09-14

## 2021-09-13 MED ORDER — PROPOFOL 10 MG/ML INTRAVENOUS EMULSION
INTRAVENOUS | Status: AC
Start: 2021-09-13 — End: 2021-09-13
  Filled 2021-09-13: qty 20

## 2021-09-13 MED ORDER — SODIUM CHLORIDE 0.9 % (FLUSH) INJECTION SYRINGE
2.0000 mL | INJECTION | Freq: Three times a day (TID) | INTRAMUSCULAR | Status: DC
Start: 2021-09-13 — End: 2021-09-13

## 2021-09-13 MED ORDER — PROPOFOL 10 MG/ML IV BOLUS
INJECTION | Freq: Once | INTRAVENOUS | Status: DC | PRN
Start: 2021-09-13 — End: 2021-09-13
  Administered 2021-09-13: 200 mg via INTRAVENOUS

## 2021-09-13 MED ORDER — SODIUM CHLORIDE 0.9 % (FLUSH) INJECTION SYRINGE
2.0000 mL | INJECTION | INTRAMUSCULAR | Status: DC | PRN
Start: 2021-09-13 — End: 2021-09-13

## 2021-09-13 MED ORDER — DEXAMETHASONE SODIUM PHOSPHATE 4 MG/ML INJECTION SOLUTION
Freq: Once | INTRAMUSCULAR | Status: DC | PRN
Start: 2021-09-13 — End: 2021-09-13
  Administered 2021-09-13: 4 mg via INTRAVENOUS

## 2021-09-13 MED ORDER — OXYCODONE 5 MG TABLET
10.0000 mg | ORAL_TABLET | ORAL | Status: DC | PRN
Start: 2021-09-13 — End: 2021-09-14

## 2021-09-13 MED ORDER — DEXTROSE 5% IN WATER (D5W) FLUSH BAG - 250 ML
INTRAVENOUS | Status: DC | PRN
Start: 2021-09-13 — End: 2021-09-13

## 2021-09-13 MED ORDER — SODIUM CHLORIDE 0.9 % (FLUSH) INJECTION SYRINGE
2.0000 mL | INJECTION | Freq: Three times a day (TID) | INTRAMUSCULAR | Status: DC
Start: 2021-09-13 — End: 2021-09-14
  Administered 2021-09-13 – 2021-09-14 (×4): 0 mL

## 2021-09-13 MED ORDER — SUCRALFATE 1 GRAM TABLET
1.0000 g | ORAL_TABLET | Freq: Three times a day (TID) | ORAL | Status: DC
Start: 2021-09-13 — End: 2021-09-14
  Administered 2021-09-13 – 2021-09-14 (×2): 1 g via ORAL
  Filled 2021-09-13 (×3): qty 1

## 2021-09-13 MED ORDER — CLONAZEPAM 1 MG TABLET
1.0000 mg | ORAL_TABLET | Freq: Two times a day (BID) | ORAL | Status: DC | PRN
Start: 2021-09-13 — End: 2021-09-14
  Administered 2021-09-13: 1 mg via ORAL
  Filled 2021-09-13: qty 1

## 2021-09-13 MED ORDER — OXYMETAZOLINE 0.05 % NASAL SPRAY
30.0000 mL | Freq: Once | NASAL | Status: DC | PRN
Start: 2021-09-13 — End: 2021-09-13
  Administered 2021-09-13: 30 mL via TOPICAL

## 2021-09-13 MED ORDER — FENTANYL (PF) 50 MCG/ML INJECTION SOLUTION
Freq: Once | INTRAMUSCULAR | Status: DC | PRN
Start: 2021-09-13 — End: 2021-09-13
  Administered 2021-09-13: 50 ug via INTRAVENOUS

## 2021-09-13 MED ORDER — SURGIFOAM SIZE 100 CM SPONGE
VAGINAL_SPONGE | Freq: Once | OTIC | Status: DC | PRN
Start: 2021-09-13 — End: 2021-09-13

## 2021-09-13 MED ORDER — LACTATED RINGERS INTRAVENOUS SOLUTION
INTRAVENOUS | Status: DC
Start: 2021-09-13 — End: 2021-09-13
  Administered 2021-09-13: 0 via INTRAVENOUS

## 2021-09-13 MED ORDER — ONDANSETRON HCL (PF) 4 MG/2 ML INJECTION SOLUTION
Freq: Once | INTRAMUSCULAR | Status: DC | PRN
Start: 2021-09-13 — End: 2021-09-13
  Administered 2021-09-13: 4 mg via INTRAVENOUS

## 2021-09-13 MED ORDER — SODIUM CHLORIDE 0.9% FLUSH BAG - 250 ML
INTRAVENOUS | Status: DC | PRN
Start: 2021-09-13 — End: 2021-09-14

## 2021-09-13 MED ORDER — DEXAMETHASONE SODIUM PHOSPHATE 4 MG/ML INJECTION SOLUTION
INTRAMUSCULAR | Status: AC
Start: 2021-09-13 — End: 2021-09-13
  Filled 2021-09-13: qty 1

## 2021-09-13 MED ORDER — SURGIFOAM SIZE 100 CM SPONGE
VAGINAL_SPONGE | Freq: Once | OPHTHALMIC | Status: DC | PRN
Start: 2021-09-13 — End: 2021-09-13
  Filled 2021-09-13: qty 1

## 2021-09-13 MED ORDER — ONDANSETRON HCL (PF) 4 MG/2 ML INJECTION SOLUTION
INTRAMUSCULAR | Status: AC
Start: 2021-09-13 — End: 2021-09-13
  Filled 2021-09-13: qty 2

## 2021-09-13 MED ORDER — BACITRACIN 500 UNIT/G OINTMENT TUBE
TOPICAL_OINTMENT | Freq: Once | CUTANEOUS | Status: DC | PRN
Start: 2021-09-13 — End: 2021-09-13

## 2021-09-13 MED ORDER — ASPIRIN 81 MG TABLET,DELAYED RELEASE
81.0000 mg | DELAYED_RELEASE_TABLET | Freq: Every day | ORAL | Status: DC
Start: 2021-09-14 — End: 2021-09-14
  Administered 2021-09-14: 81 mg via ORAL
  Filled 2021-09-13: qty 1

## 2021-09-13 MED ORDER — LIDOCAINE 1 %-EPINEPHRINE 1:100,000 INJECTION SOLUTION
15.0000 mL | Freq: Once | INTRAMUSCULAR | Status: DC | PRN
Start: 2021-09-13 — End: 2021-09-13

## 2021-09-13 MED ORDER — OXYCODONE 5 MG TABLET
5.0000 mg | ORAL_TABLET | ORAL | Status: DC | PRN
Start: 2021-09-13 — End: 2021-09-14

## 2021-09-13 SURGICAL SUPPLY — 17 items
BALL COTTON LRG HI ABS 1IN 3IN STRL LF (MED SURG SUPPLIES) ×2 IMPLANT
BALL COTTON LRG HI ABS 1IN 3IN_STRL LF (MED SURG SUPPLIES) ×1
BLADE 45D OFST BEAVER NRW SHAFT SPR TIP SURG MYRINGOTOMY (SURGICAL CUTTING SUPPLIES) ×2 IMPLANT
BLADE MYRINGOTOMY BEAVER 45D O_FST NRW SHAFT SPR TIP (CUTTING ELEMENTS) ×1
CONV USE 320027 - CHILDRENS USE 320025 - KIT RM TURNOVER CSTM NONST LF (DRAPE/PACKS/SHEETS/OR TOWEL) ×2
CONV USE 320027 - CHILDRENS USE 320025 - KIT RM TURNOVER CUSTOM NONST LF (DRAPE/PACKS/SHEETS/OR TOWEL) ×2 IMPLANT
COVER WAND RFD STRL 50EA/CS_01-0020 (DRAPE/PACKS/SHEETS/OR TOWEL) ×1
COVER WND RF DETECT STRL CLR EQP (DRAPE/PACKS/SHEETS/OR TOWEL) ×2 IMPLANT
KIT ROOM TURNOVER ~~LOC~~ CUSTOM (DRAPE/PACKS/SHEETS/OR TOWEL) ×1
LABEL E-Z STICK_STLEZP1 100EA/CS (MED SURG SUPPLIES) ×1
LABEL MED EZ PEEL MRKR LF (MED SURG SUPPLIES) ×2 IMPLANT
STRAP POSITION KNEE FOAM SFT ADJ CNTCT CLSR LF (MED SURG SUPPLIES) ×2 IMPLANT
STRAP POSITION KNEE FOAM SFT A_DJ CNTCT CLSR LF (MED SURG SUPPLIES) ×1
SYRINGE LL 5ML LF STRL GRAD M_ED POLYPROP DISP CLR (MED SURG SUPPLIES) ×2
SYRINGE LL 5ML LF STRL GRAD N-PYRG DEHP-FR PVC FREE MED DISP CLR (MED SURG SUPPLIES) ×2 IMPLANT
TUBING SUCT CLR 20FT 9/32IN MEDIVAC NCDTV M/M CONN STRL LF (MED SURG SUPPLIES) ×2 IMPLANT
TUBING SUCT CONN 20FT LONG_STRL N720A (MED SURG SUPPLIES) ×1

## 2021-09-13 NOTE — Anesthesia Transfer of Care (Signed)
ANESTHESIA TRANSFER OF CARE   Connie Pineda is a 81 y.o. ,female, Weight: 76.8 kg (169 lb 5 oz)   had Procedure(s) with comments:  REMOVAL FOREIGN BODY EAR - removal retained vent tube  MYRINGOPLASTY WITH PAPER PATCH  performed  09/13/21   Primary Service: Oneta Rack*    Past Medical History:   Diagnosis Date   . Bruises easily    . Cancer (CMS [place])    . Esophageal reflux     controlled with medication   . Essential hypertension    . Hearing loss    . Heart disease    . History of kidney disease     dcotors are keeping an eye on her kidney function per patient   . Hyperlipidemia    . Kidney stones    . TIA (transient ischemic attack)     Summer 2022, affected her memory      Allergy History as of 09/13/21      No Known Allergies              I completed my transfer of care / handoff to the receiving personnel during which we discussed:  Access, Airway, Analgesia, All key/critical aspects of case discussed, Fluids/Product, Gave opportunity for questions and acknowledgement of understanding and PMHx    Post Location: PACU                                                                  Last OR Temp: Temperature: 36.1 C (97 F)  ABG:  POTASSIUM   Date Value Ref Range Status   08/16/2021 3.6 3.5 - 5.1 mmol/L Final     CALCIUM   Date Value Ref Range Status   08/16/2021 10.6 (H) 8.8 - 10.2 mg/dL Final     Calculated P Axis   Date Value Ref Range Status   09/09/2021 64 degrees Final     Calculated R Axis   Date Value Ref Range Status   09/09/2021 12 degrees Final     Calculated T Axis   Date Value Ref Range Status   09/09/2021 25 degrees Final     Airway:* No LDAs found *  Blood pressure 101/64, pulse 59, temperature 36.1 C (97 F), resp. rate (!) 10, height 1.6 m ('5\' 3"'$ ), weight 76.8 kg (169 lb 5 oz), SpO2 95 %.

## 2021-09-13 NOTE — Discharge Instructions (Signed)
SURGICAL DISCHARGE INSTRUCTIONS     Dr. Roslyn Smiling, Noah Charon, MD  performed your REMOVAL FOREIGN BODY EAR, MYRINGOPLASTY WITH PAPER PATCH today at the [facility]:  Monday through Friday from 6 a.m. - 7 p.m.: [phone]  Between 7 p.m. - 6 a.m., weekends and holidays:  Call [facility] at [phone] or [phone].    PLEASE SEE WRITTEN HANDOUTS AS DISCUSSED BY YOUR NURSE:      SIGNS AND SYMPTOMS OF A WOUND / INCISION INFECTION   Be sure to watch for the following:  Increase in redness or red streaks near or around the wound or incision.  Increase in pain that is intense or severe and cannot be relieved by the pain medication that your doctor has given you.  Increase in swelling that cannot be relieved by elevation of a body part, or by applying ice, if permitted.  Increase in drainage, or if yellow / green in color and smells bad. This could be on a dressing or a cast.  Increase in fever for longer than 24 hours, or an increase that is higher than 101 degrees Fahrenheit (normal body temperature is 98 degrees Fahrenheit). The incision may feel warm to the touch.    **CALL YOUR DOCTOR IF ONE OR MORE OF THESE SIGNS / SYMPTOMS SHOULD OCCUR.    ANESTHESIA INFORMATION   ANESTHESIA -- ADULT PATIENTS:  You have received intravenous sedation / general anesthesia, and you may feel drowsy and light-headed for several hours. You may even experience some forgetfulness of the procedure. DO NOT DRIVE A MOTOR VEHICLE or perform any activity requiring complete alertness or coordination until you feel fully awake in about 24-48 hours. Do not drink alcoholic beverages for at least 24 hours. Do not stay alone, you must have a responsible adult available to be with you. You may also experience a dry mouth or nausea for 24 hours. This is a normal side effect and will disappear as the effects of the medication wear off.    REMEMBER   If you experience any difficulty breathing, chest  pain, bleeding that you feel is excessive, persistent nausea or vomiting or for any other concerns:  Call your physician Dr. Roslyn Smiling at [phone] or [phone]. You may also ask to have the OTOLARYNGOLOGY doctor on call paged. They are available to you 24 hours a day.    SPECIAL INSTRUCTIONS / COMMENTS       FOLLOW-UP APPOINTMENTS   Please call patient services at [phone] or [phone] to schedule a date / time of return. They are open Monday - Friday from 7:30 am - 5:00 pm.

## 2021-09-13 NOTE — Nurses Notes (Signed)
[address]  patient is asking if she can have melatonin '6mg'$  for sleep? thanks   #[phone]  Text paged [facility].

## 2021-09-13 NOTE — OR Surgeon (Signed)
[facility]   OTOLARYNGOLOGY DEPARTMENT   OPERATIVE NOTE    Name: Connie Pineda, 81 y.o. female  MRN: [id number]  DOB: Jul 01, 1940  Date of Surgery: 09/13/2021    Preoperative Diagnoses:  1.  Retained right ventilation tube      Postoperative Diagnoses: Same    Procedure:   Removal of right ventilation tube and paper patch myringoplasty     Surgeons: Helene Kelp, MD, Lynetta Mare, MD    Anesthesia:  General Anesthesia administered via LMA    Estimated Blood Loss: Minimal    Fluids: None    Operative Findings: Retained right tube, extruded in large crust, removed.  Anterior-inferior perforation, appears matured with fibrous band around edge, not fresh.  The edges were freshened and a paper patch with adhesive was placed over the perforation.    Specimens:  None     Complications:  None  Condition:  Stable  Disposition:   Please Discharge home when meets [place] criteria.    Description of Procedure:  After the patietn was allowed to ask any remaining questions in the preoperative holding area, there questions were answered.  The patient was then escorted back to the operating room by both ENT and anesthesia.  Once in the operating room a surgical pause was conducted.  The patient was then induced with general anesthesia via a LMA.  Once given the go ahead by anesthesia, the microscope was pulled to the table.   The ear speculum was placed in the right ear.  There was a large crust with extruded tube filling medial canal which was removed.  Behind this was a matured anterior-inferior perforation, maybe 20%.  The middle ear was healthy without granulation tissue or otorrhea.  The edges of the perforation were freshened using right angle pick and a small paper patch with adhesive was placed lateral to the perforation.    A cotton ball was placed in the the meatus.  The patient was then allowed to wake up and taken to postoperative recovering in stable condition.  I was present for the entire  procedure.      PLEASE DISCHARGE PATIENT TO HOME POSTOP IF MEETS [place] CRITERIA.      Helene Kelp, MD  [place] Department of Otolaryngology    [name], MD  09/13/2021, 11:48

## 2021-09-13 NOTE — Anesthesia Postprocedure Evaluation (Signed)
Anesthesia Post Op Evaluation    Patient: Connie Pineda  Procedure(s) with comments:  REMOVAL FOREIGN BODY EAR - removal retained vent tube  MYRINGOPLASTY WITH PAPER PATCH    Last Vitals:Temperature: 36.4 C (97.5 F) (09/13/21 1530)  Heart Rate: 71 (09/13/21 1530)  BP (Non-Invasive): 126/71 (09/13/21 1530)  Respiratory Rate: 12 (09/13/21 1530)  SpO2: 93 % (09/13/21 1530)    No notable events documented.    Patient is sufficiently recovered from the effects of anesthesia to participate in the evaluation and has returned to their pre-procedure level.  Patient location during evaluation: PACU       Patient participation: complete - patient participated  Level of consciousness: awake and alert and responsive to verbal stimuli    Pain management: adequate  Airway patency: patent    Anesthetic complications: no  Cardiovascular status: acceptable  Respiratory status: acceptable  Hydration status: acceptable  Patient post-procedure temperature: Pt Normothermic   PONV Status: Absent

## 2021-09-13 NOTE — Nurses Notes (Signed)
Patient arrived to 9E from PACU around 1825 only for observation following anesthesia since she had no one to be physically present 24/7 overnight. Assessment benign. OOB ad lib. Denies pain, SOB, n/v, n/t. Cotton ball intact in R ear. Pt oriented to room and call bell. Eager to go home tomorrow.

## 2021-09-13 NOTE — Nurses Notes (Signed)
After speaking with patient, she stated that there was not going to be anyone to stay with her after she left the hospital and overnight. Dr Roslyn Smiling notified, orders to be placed for pt to stay in the hospital overnight.

## 2021-09-13 NOTE — OR Surgeon (Deleted)
[facility] OF OTOLARYNGOLOGY  OPERATIVE REPORT    PATIENT NAME:  Connie Pineda 81 y.o. female  MRN: [id number]  DOB:  [birth year]  DATE OF SERVICE:  09/13/2021    PRE/POST-OPERATIVE DIAGNOSES:  1.  Patulous eustachian tube   2.  History of BVT     PROCEDURE:  1.  Removal of right ventilation tube with paper patch myringoplasty    SURGEONS:  Lynetta Mare, MD (Resident Surgeon), Michel Santee, MD (Staff Surgeon).    ANESTHESIA:  General anesthesia.    ESTIMATED BLOOD LOSS:  <71m    OPERATIVE FINDINGS:  1.  Right T tube surrounded by crust and cerumen debris.   2.  anterior marginal perforation involving about 45% of the tympanic membrane which appeared with well healed edges.  3.  Very narrow ear canal limited good view of the anterior edge of the perforation.   4.  There was no evidence of cholesteatoma. middle ear mucosa appeared healthy.      IMPLANTS: None    SPECIMENS: none    COMPLICATIONS:  None.    INDICATIONS FOR PROCEDURE:    [name] a 81y.o. 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 She underwent left VT remvoal and paper patch myringoplasty on 05/26/21. She is here today for right VT removal and myringoplasty.       DESCRIPTION OF PROCEDURE:    After ensuring we had appropriate informed consent and patient identification in the preoperative holding area, the patient was escorted back to the operating room by anesthesia.  Once in the operating room, an LMA tube was placed without incident. A timeout was conducted prior to the start of the procedure confirming correct patient, site and procedure.     The ear was draped in the usual sterile fashion. Microscope was brought to the table. The ear canal was cleaned and all cerumen was removed. The crusting debris and ventilation tube were then carefully removed. There remained an anterior marginal perforation involving about 45% of the tympanic membrane which appeared with well healed edges. The ear canal was  very narrow ear canal limited good view of the anterior edge of the perforation. There was no evidence of cholesteatoma. middle ear mucosa appeared healthy. A paper patch was then fashioned and mastisol was applied to the paper. We then placed the paper patch over the tympanic membrane defect.     The patient was then turned back over to anesthesia, allowed to wake up, and taken to the postoperative recovery area in stable condition. Dr. KRoslyn Smilingwas present for the key and critical portions of the procedure.    PLEASE DISCHARGE PATIENT TO HOME IF [place] ARE [place].    ALynetta Mare MD 09/13/2021, 11:40  Department of Otolaryngology - Head and Neck Surgery

## 2021-09-13 NOTE — H&P (Signed)
[facility]  H&P    Date: 09/13/2021  Name: Connie Pineda, 81 y.o., female  MRN: [id number]  DOB: 1940-10-13    HPI:   Connie Pineda is a 81 y.o. 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 She underwent left VT remvoal and paper patch myringoplasty on 05/26/21. She is here today for right VT removal and myringoplasty.  No new concerns at this time.     Past Medical History:   Diagnosis Date    Bruises easily     Cancer (CMS [place])     Esophageal reflux     controlled with medication    Essential hypertension     Hearing loss     Heart disease     History of kidney disease     dcotors are keeping an eye on her kidney function per patient    Hyperlipidemia     Kidney stones     TIA (transient ischemic attack)     Summer 2022, affected her memory     Past Surgical History:   Procedure Laterality Date    Cataract extraction, bilateral Bilateral     Colonoscopy      Hx other      Hx tonsillectomy      Hx ventilatory tubes Bilateral     Kidney surgery      Mastectomy, partial Left      Social History     Socioeconomic History    Marital status: Widowed   Tobacco Use    Smoking status: Never    Smokeless tobacco: Never   Vaping Use    Vaping Use: Never used   Substance and Sexual Activity    Alcohol use: Never    Drug use: Never   Other Topics Concern    Ability to Walk 1 Flight of Steps without SOB/CP Yes    Ability to Walk 2 Flight of Steps without SOB/CP Yes    Ability To Do Own ADL's Yes    Uses Walker No    Uses Cane No     No Known Allergies  Family Medical History:       Problem Relation (Age of Onset)    Heart Disease Father    Thyroid Cancer Mother          No current outpatient medications on file.       Physical Examination: No acute distress.  No respiratory distress no stridor.  No cyanosis of the upper extremities.  No epistaxis.  No otorrhea.  Moves all extremities.    Otolaryngology Specific Comorbid Conditions  - For complete past medical, surgical and social  histories, please see relevant sections in H&P.   - she has a past medical history of Bruises easily, Cancer (CMS [place]), Esophageal reflux, Essential hypertension, Hearing loss, Heart disease, History of kidney disease, Hyperlipidemia, Kidney stones, and TIA (transient ischemic attack).     Pertinent labs and clinical data reviewed:   Nutritional status and Endocrine function:  Body mass index is 29.99 kg/m.  Weight: 76.8 kg (169 lb 5 oz)  Ideal body weight: 52.4 kg (115 lb 8.3 oz)  Adjusted ideal body weight: 62.2 kg (137 lb 0.6 oz)  Recent Labs   Labs - Last 6 Months 08/16/21  1341   ALBUMIN 4.2     Recent Labs   Labs - Last 6 Months 08/17/21  1034   TSH 1.826    Hepatic function:  Recent Labs   Labs -  Last 6 Months 08/16/21  1341   TOTBILIRUBIN 1.9*   AST 18   ALT 21   ALKPHOS 99   LDH 168      CBC and Coag function:  Recent Labs   Labs - Last 6 Months 08/16/21  1341   WBC 6.6   HGB 15.1   HCT 43.8   PLTCNT 178  178     BMP and Renal function:  Recent Labs   Labs - Last 6 Months 08/16/21  1341   SODIUM 141   POTASSIUM 3.6   CHLORIDE 107   CALCIUM 10.6*   BUN 16   CREATININE 0.90   GFR 65         COVID-19 screening:   No results found for: COVID19, [place], COVID19RES, VELFY10FB, CORONA, SARSCOV2RNA    Pre-Surgical H & P for the day of the procedure.  1. Change in medications: No      Comments: None    2.  Patient continues to be appropiate candidate for planned surgical procedure. YES    Lynetta Mare, MD 09/13/2021, 08:31  Department of Otolaryngology - Head and Neck Surgery        I saw and examined the patient.  I reviewed the resident's note.  I agree with the findings and plan of care as documented in the resident's note.  Any exceptions/additions are edited/noted.    [name], MD

## 2021-09-14 DIAGNOSIS — Z9889 Other specified postprocedural states: Secondary | ICD-10-CM

## 2021-09-14 NOTE — Nurses Notes (Signed)
Patient discharged home with family.  AVS reviewed with patient/care giver.  A written copy of the AVS and discharge instructions was given to the patient/care giver.  Questions sufficiently answered as needed.  Patient/care giver encouraged to follow up with PCP as indicated.  In the event of an emergency, patient/care giver instructed to call 911 or go to the nearest emergency room.

## 2021-09-14 NOTE — Care Plan (Signed)
Problem: Fall Injury Risk  Goal: Absence of Fall and Fall-Related Injury  Outcome: Outcome Achieved  Intervention: Promote Injury-Free Environment  Recent Flowsheet Documentation  Taken 09/14/2021 0800 by Tilden Dome, RN  Safety Promotion/Fall Prevention: nonskid shoes/slippers when out of bed

## 2021-09-14 NOTE — Progress Notes (Signed)
[facility] OF OTOLARYNGOLOGY - HEAD AND NECK SURGERY  INPATIENT PROGRESS NOTE             Current Date: 09/14/2021, 05:35  Name: Laynie Espy, 81 y.o. female  MRN: [id number]  DOB: [birth year]  Date of Admission: 09/13/2021    SUBJECTIVE:  No acute events overnight. Patient resting comfortably in bed on room air. Denies new complaints or concerns.    OBJECTIVE:   Vitals:   Temp (24hrs) Max:36.6 C (97.9 F)  Temperature: 36.4 C (97.5 F)  BP (Non-Invasive): 118/72  MAP (Non-Invasive): 87 mmHG  Heart Rate: 74  Respiratory Rate: 17  SpO2: 90 %   Today's Physical Exam:  General: NAD, breathing comfortably on room air  Eyes: Conjunctive clear, EOMI grossly intact  Ears: No otorrhea. No bleeding. Cotton ball in the right ear.   Face: Symmetric, no lesions   Nose: External pyramid midline   Oropharynx: : No mucosal lesions, masses, or pharyngeal asymmetry. Oropharynx is clear without drainage or erythema.  Neck: Trachea midline, no stridor    I/O:    I/O: Last 24 hours  04/04 0700 - 04/05 0659  In: 250 [I.V.:250]  Out: -     I/O: Last Shift  No intake/output data recorded.        Labs/Imaging/Studies:  Lab Results   Component Value Date    WBC 6.6 08/16/2021    HGB 15.1 08/16/2021    HCT 43.8 08/16/2021    PLTCNT 178 08/16/2021    PLTCNT 178 08/16/2021    PMNS 63 08/16/2021       No results for input(s): SODIUM, POTASSIUM, CHLORIDE, BICARBONATE, BUN, CREATININE, GLUCOSE, CALCIUM, MAGNESIUM, PHOSPHORUS in the last 72 hours.       ASSESSMENT & PLAN:  Margurite Duffy is a 81 y.o. female with a history of a retained right ventilation tube who is  LOS: 0 days  & 1 Day Post-Op s/p removal of right ventilation tube and paper patch myringoplasty. Patient's post-operative course has been uneventful.     Plan:   General:   - Restarted home meds  - Diet: regular  - Pain: Tylenol, Roxicodone  - Nausea: none  - Bowel regimen: none  - Antibiotics: none  - DVT prophylaxis: none, low risk  - Activity: as tolerated    Code: Full      Disposition: pending ride to assisted living facility. Likely discharge today. Will confirm with staff.       Baxter Flattery, MD  09/14/2021, 05:35

## 2021-09-14 NOTE — Discharge Summary (Signed)
[facility]  DISCHARGE SUMMARY    PATIENT NAME:  Connie Pineda, Connie Pineda  MRN:  [id number]  DOB:  09-09-1940    ENCOUNTER DATE:  09/13/2021  INPATIENT ADMISSION DATE:   DISCHARGE DATE:  09/14/2021    ATTENDING PHYSICIAN: Kellermeyer, Burnadette Peter*  SERVICE: OTOLARYNGOLOGY  PRIMARY CARE PHYSICIAN: Elgie Collard, MD       No lay caregiver identified.      PRIMARY DISCHARGE DIAGNOSIS: Observation after surgery  Active Hospital Problems    Diagnosis Date Noted   . Principal Problem: Observation after surgery [Z48.89] 09/13/2021      Resolved Hospital Problems   No resolved problems to display.     There are no active non-hospital problems to display for this patient.          Current Discharge Medication List      CONTINUE these medications - NO CHANGES were made during your visit.      Details   amLODIPine 2.5 mg Tablet  Commonly known as: NORVASC   2.5 mg, Oral  Refills: 0     aspirin 81 mg Capsule   Oral  Refills: 0     atorvastatin 20 mg Tablet  Commonly known as: LIPITOR   40 mg, Oral  Refills: 0     clonazePAM 1 mg Tablet  Commonly known as: KLONOPIN   1 mg, Oral, 2 TIMES DAILY  Qty: 60 Tablet  Refills: 4     Elmiron 100 mg Capsule  Generic drug: Pentosan Polysulfate Sodium   TAKE ONE CAPSULE BY MOUTH 3 TIMES A DAY BEFORE MEALS  Qty: 90 Capsule  Refills: 3     HERBAL DRUGS ORAL   DAILY  Refills: 0     metoprolol succinate 25 mg Tablet Sustained Release 24 hr  Commonly known as: TOPROL-XL   25 mg, Oral  Refills: 0     PEPTO-BISMOL ORAL   Take by mouth  Refills: 0     sucralfate 1 gram Tablet  Commonly known as: CARAFATE   1 g, Oral, 3 TIMES DAILY BEFORE MEALS  Refills: 0     triamterene-hydroCHLOROthiazide 37.5-25 mg Capsule  Commonly known as: DYAZIDE   1 Capsule, Oral, EVERY MORNING  Refills: 0     zinc Sulfate 50 mg zinc (220 mg) Tablet  Commonly known as: ZINCATE   50 mg, Oral, DAILY  Refills: 0        ASK your doctor about these medications.      Details   esomeprazole magnesium 40 mg Capsule, Delayed  Release(E.C.)  Commonly known as: NEXIUM   40 mg, EVERY MORNING BEFORE BREAKFAST  Refills: 0          Discharge med list refreshed?  YES     No Known Allergies  HOSPITAL PROCEDURE(S):   No orders of the defined types were placed in this encounter.    Surgical/Procedural Cases on this Admission     Case IDs Date Procedure Surgeon Location Status    [phone] 09/13/21 [place] WITH PAPER PATCH Kellermeyer, Noah Charon, MD [facility] OR 2 WEST Comp        REASON FOR HOSPITALIZATION AND HOSPITAL COURSE   BRIEF HPI:  This is a 81 y.o., female admitted s/p paper patch myringoplasty.  BRIEF HOSPITAL NARRATIVE:     Connie Pineda is a 81 y.o. female with a history of TM perforation who presented on 09/13/2021 for the above procedures performed by Kellermeyer, Burnadette Peter*. The operation occurred without complication, and  the patient was extubated post-operatively.     The remainder of the patient's hospitalization was uneventful, and she was determined safe for discharge on 09/14/21.  Prior to discharge the patient was tolerating a regular diet, voiding on her own, her pain was well controlled on pain medications, and she was ambulating well and ENT felt she was safe for discharge.  The patient will follow up with Kellermeyer, Burnadette Peter* in 4 weeks for reevaluation.  All questions were answered prior to discharge and the patient agreed to discharge at this time.  Instructions on diet, activity, and wound care were given to them. The patient was instructed to follow up sooner for new or concerning symptoms.       TRANSITION/POST DISCHARGE CARE/PENDING TESTS/REFERRALS:   CONDITION ON DISCHARGE:  A. Ambulation: Full ambulation  B. Self-care Ability: Complete  C. Cognitive Status Alert and Oriented x 3  D. Code status at discharge:       LINES/DRAINS/WOUNDS AT DISCHARGE:   Patient Lines/Drains/Airways Status     Active Line / Dialysis Catheter / Dialysis Graft / Drain / Airway / Wound     Name Placement date  Placement time Site Days    Peripheral IV Posterior;Right Wrist 09/13/21  0910  -- less than 1                DISCHARGE DISPOSITION:  Home discharge  DISCHARGE INSTRUCTIONS:  Post-Discharge Follow Up Appointments     Follow up with ENT, Physician Office Center    Phone: [phone]    Where: [place], [place] [address]    Thursday Sep 22, 2021    Post-Operative Visit with Gillermo Murdoch, APRN at 11:00 AM    Monday Nov 28, 2021    Return Patient Visit with Woodfin Ganja, MD at  1:00 PM    Monday Dec 19, 2021    Carotid Artery Duplex with SET VASC TECH 1 at 10:15 AM    Return Patient Visit with Reubin Milan, MD at 11:00 AM    Tuesday Feb 14, 2022    Lab with Lab, [place] at 11:40 AM    Return Patient Visit with Katheren Puller, MD at 12:40 PM    Monday Feb 20, 2022    Lab with Lab, [place] at 10:00 AM    Return Patient Visit with Katheren Puller, MD at 10:40 AM    Tuesday Mar 21, 2022    Return Patient Visit with Vista Lawman, MD at 11:00 AM    ENT, [facility] ENT  [facility] ENT, [place] [address]  [phone] Hematology/Oncology, [place]  [facility], [place] [address]  [place], [place], [place]  [address]  [place] [address]  [phone]    Laboratory Services, [place]  [facility], [place] [address]  [phone] Neurology, Physician [place], [place] [address]  [phone] Urology, [place], [place] [address]  [phone]    Vascular Surgery, [place], [place]  [address]  [place]  [address]  [phone]  DISCHARGE INSTRUCTION - MISC    If you have any questions or concerns please call our clinic hotline 8am-5pm (Monday to Friday) at [phone] to speak with a nurse who can contact your doctor. If you need assistance or have concerns outside of these hours please call [facility] hotline at [phone]. They will be able to connect you with the on-call Resident Physician if necessary. Please note the on call Resident Physician will return your call as soon as possible but this may be delayed if they are attending to other patients or emergencies.     If your are experiencing a medical emergency please call 911 or go to the nearest emergency department immediately.     DISCHARGE INSTRUCTION - MISC    What to Expect After ear Surgery    No heavy lifting for 3 weeks. You can return to work in a week.    Showering:  After the dressing is removed, it is ok to shower. DO NOT scrub the area around your ear.   When you shower you MUST KEEP THE EAR CANAL DRY. This is typically best done by simply placing a cotton ball coated with bacitracin in your ear while in the shower or bath. You will need to keep your ear canal dry until your surgeon tells you it's ok to get water in your ear.     Pain:  Pain should be limited to mild earache and controlled with tylenol as needed.     Activity  It is important that you do not over exert yourself after surgery. You should not lift anything over 10lbs for 2 weeks.   Also avoid any activities that increase your heart rate or blood pressure.     Hearing  Sometimes surgery is performed to improve hearing. But depending on the situation, hearing improvement may not be expected after surgery. This will, of course, need to be discussed in your preoperative appointment. Regardless, the packing in your ear after surgery makes hearing seem significantly decreased.  Do not be troubled by this.      SCHEDULE FOLLOW-UP ENT - PHYSICIAN OFFICE CENTER     Follow-up in: [place]    Reason for visit: HOSPITAL DISCHARGE    Follow-up reason: sp paper patch myringoplasty    Provider: kellermeyer           Rob Hickman, MD    Copies sent to Care Team       Relationship Specialty Notifications Start End    Elgie Collard, MD PCP - General EXTERNAL  06/16/20     Phone: [phone] Fax: [phone]         [place] [address] [place] [address]          Referring providers can utilize [email or web address] to access their referred [facility] patient's information.

## 2021-09-15 ENCOUNTER — Ambulatory Visit (INDEPENDENT_AMBULATORY_CARE_PROVIDER_SITE_OTHER): Payer: Self-pay | Admitting: Otolaryngology

## 2021-09-15 NOTE — Telephone Encounter (Signed)
Called pt, no answer. Left message with call back number.

## 2021-09-15 NOTE — Telephone Encounter (Signed)
Regarding: DR. Roslyn Smiling  ----- Message from Clemon Chambers sent at 09/14/2021  3:25 PM EDT -----  Patient calling;  patient had surgery 04.04.23 , stayed overnight in hospital bc no one to stay with patient,   patient says a medication was to be called into the Pharmacy;    [place], [place]   Phone:  [phone]  Fax:  [phone]      Patient doesn't no the name of medication,  patient called the pharmacy no script on file,   please call patient to advise,   ThankYou!

## 2021-09-15 NOTE — Telephone Encounter (Signed)
Regarding: [facility]  ----- Message from Namon Cirri sent at 09/15/2021 12:41 PM EDT -----  Patient stated that she was supposed to have a script called in yesterday.   She had a procedure done with [facility].   [phone]      Preferred [facility], [place]    [place] [address]    Phone: [phone] Fax: [phone]    Hours: Not open 24 hours

## 2021-09-15 NOTE — Telephone Encounter (Signed)
Haught, Danae Chen, MD  Mendel Ryder, RN  Caller: Unspecified (Yesterday, 3:25 PM)  No medications needed. No drops, no oral antibiotics and no pain meds. Tylenol or motrin as needed.

## 2021-09-15 NOTE — Telephone Encounter (Signed)
Spoke to patient, advised that it does not look like they were sending anything into the pharmacy, just to take tylenol as needed for pain. Patient states that she did have an earache this morning, which tylenol and warm compress did help. Patient was concerned that she may have an ear infection. I advised pt that it likely is post operative pain and would recommend to take tylenol as needed. Will check with a resident to make sure no medication is needed or was meant to be sent in. Advised pt to monitor for sx of infections such as fever, increase in pain, swelling, redness and warmth. Advised pt that I will call back if anything is going to be send into pharmacy. Pt stated understanding and denies further questions

## 2021-09-16 ENCOUNTER — Ambulatory Visit (INDEPENDENT_AMBULATORY_CARE_PROVIDER_SITE_OTHER): Payer: Self-pay | Admitting: Otolaryngology

## 2021-09-16 NOTE — Telephone Encounter (Signed)
Regarding: [facility]  ----- Message from Clovis sent at 09/16/2021  1:43 PM EDT -----  Patient is calling again & suppose to have a prescription called to her pharmacy.  Patient thinks maybe her anesthesia has caused her to not be able to go to the bathroom.  Patient states she hasn't been able to go for 4-5 days.  Patient uses [facility] [address] Phone: [phone] Fax: [phone].  Patient can be reached at [phone].  Thank you         ----- Message from Namon Cirri sent at 09/15/2021 12:41 PM EDT -----  Patient stated that she was supposed to have a script called in yesterday.   She had a procedure done with [facility].   [phone]      Preferred [place], [place]    [place] [address]    Phone: [phone] Fax: [phone]    Hours: Not open 24 hours

## 2021-09-16 NOTE — Telephone Encounter (Signed)
Pineda, Connie Chen, MD  Mendel Ryder, RN  Caller: Unspecified (Yesterday, 3:25 PM)  No medications needed. No drops, no oral antibiotics and no pain meds. Tylenol or motrin as needed.    Spoke to patient, advised of the above. Advised pt that she may take an OTC stool softener to help with constipation that is sometimes caused by anesthesia. Patient stated understanding and denies further questions

## 2021-09-22 ENCOUNTER — Ambulatory Visit (INDEPENDENT_AMBULATORY_CARE_PROVIDER_SITE_OTHER): Payer: Medicare Other | Admitting: Family

## 2021-09-22 ENCOUNTER — Encounter (INDEPENDENT_AMBULATORY_CARE_PROVIDER_SITE_OTHER): Payer: Self-pay | Admitting: Family

## 2021-09-22 VITALS — BP 120/58 | HR 61 | Temp 97.1°F | Ht 63.0 in | Wt 168.0 lb

## 2021-09-22 DIAGNOSIS — H7291 Unspecified perforation of tympanic membrane, right ear: Secondary | ICD-10-CM

## 2021-09-22 DIAGNOSIS — Z9889 Other specified postprocedural states: Secondary | ICD-10-CM

## 2021-09-22 NOTE — Progress Notes (Signed)
[facility] PROGRESS NOTE    Name: Connie Pineda, 81 y.o. female  MRN: [id number]  Date of Birth: 04-02-1941  Date of Service: 09/22/2021    Subjective: Connie Pineda is a 81 y.o. female presenting for post op tube removal with paper patch with Dr. Roslyn Smiling on 09/13/21. She denies any otalgia or otorrhea. She has had a bit of disequilibrium/imbalance over the past couple days. It is mild. She denies facial drooping, slurred speech, unilateral weakness.     Physical Exam:  BP (!) 120/58   Pulse 61   Temp 36.2 C (97.1 F) (Thermal Scan)   Ht 1.6 m ('5\' 3"'$ )   Wt 76.2 kg (167 lb 15.9 oz)   SpO2 95%   BMI 29.76 kg/m       General Appearance: Well-appearing 81 y.o. female who is cooperative and in no acute distress.   Head: Normocephalic and atraumatic.  Face: Symmetric, and without obvious lesions. Non-syndromic facies.   Ears: Binocular microscopy used for improved visualization  Pinnae normal shape and position bilaterally.   Left: External auditory canal patent and dry. Tympanic membrane intact, healthy-appearing, mid-position, and with good middle ear aeration.  Right: External auditory canal patent and dry. Tympanic membrane with about 10% perforation present, healthy-appearing, mid-position, and with good middle ear aeration.  Integumentary: Skin warm and dry, with no facial or neck rash.  Cardiovascular: Upper extremities are warm and well-perfused, with no cyanosis of the hands or fingers.   Respiratory: No apparent stridulous breathing. No acute respiratory distress.  Musculoskeletal: No gross deformities of the upper extremities.   Neurologic:  Moving all extremities. Facial nerve intact. CN 2-12 intact.   Psychiatric:  Alert and with age-appropriate affect.    Review of Information:  N/A    Special Procedures:       Assessment:   Connie Pineda is a 81 y.o. female who presents with      ICD-10-CM    1. S/P ear surgery  Z98.890       2.  Perforation of right tympanic membrane  H72.91            Plan:  No orders of the defined types were placed in this encounter.    -Perforation still present in the right. Educated they can take some time to heal.   -Educated on continuing no lifting/nose blowing for one more week.   -Dry ear precautions.     Follow up in 6 weeks with Dr. Roslyn Smiling or sooner with new or worsening symptoms or if imbalance does not improve.     Gillermo Murdoch, APRN, FNP-C  Seen independently by Gillermo Murdoch, APRN, FNP-C with cosigning faculty available for consultation.     Gillermo Murdoch, APRN    CC:    PCP Elgie Collard, MD  [facility] [address]  [place] [address]   Referring Provider No referring provider defined for this encounter.

## 2021-10-21 ENCOUNTER — Other Ambulatory Visit (INDEPENDENT_AMBULATORY_CARE_PROVIDER_SITE_OTHER): Payer: Self-pay | Admitting: Specialist

## 2021-11-04 ENCOUNTER — Other Ambulatory Visit: Payer: Self-pay

## 2021-11-04 ENCOUNTER — Ambulatory Visit (INDEPENDENT_AMBULATORY_CARE_PROVIDER_SITE_OTHER): Payer: Medicare Other | Admitting: Otolaryngology

## 2021-11-04 VITALS — BP 118/66 | HR 57 | Temp 96.6°F | Ht 63.0 in | Wt 164.9 lb

## 2021-11-04 DIAGNOSIS — H7291 Unspecified perforation of tympanic membrane, right ear: Secondary | ICD-10-CM

## 2021-11-04 NOTE — Progress Notes (Signed)
PATIENT NAME:  Connie Pineda  MRN:  [id number]  DOB:  [birth year]  DATE OF SERVICE: 11/04/2021    HPI:  Connie Pineda is an 81 y.o. female with history of eustachian tube dysfunction patulous versus eustachian tube dysfunction. She came to my clinic with bilateral tubes in the past, which did not alleviate her symptoms of fullness. It was usually worse when the barometric pressure changes, worse when she is at different altitudes. She underwent left tube removal with paper patch placement in the clinic, and then I did a right-sided tube removal with paper patch in the OR due to patient intolerance. She has done well since surgery with no change in symptoms, no otorrhea, and no change in hearing.     Physical Exam:  Blood pressure 118/66, pulse 57, temperature 35.9 C (96.6 F), height 1.6 m ('5\' 3"'$ ), weight 74.8 kg (164 lb 14.5 oz), SpO2 96 %.  Body mass index is 29.21 kg/m.  General: Well-appearing female in no acute distress.  Head: Normocephalic, atraumatic.    Ears: Binocular microscopy used to visualize both ears:   Left Ear: Canal patent. Drum intact, mid-position, mobile. Middle ear appear aerated. No evidence of extrusion of the drum with inhalation through the left side of her nose.   Right Ear: Drum is intact with paper patch still on the posterior portion of the drum. It is mobile. No extrusion with inhalation of the nose. No evidence of middle ear effusion.   Neurologic: Cranial nerves: II through XII grossly intact.     Procedure:       Data Reviewed:  N/A    Assessment:  Assessment/Plan   1. Perforation of right tympanic membrane      Assessment:    Postop right tube removal and paper patch. Patch is still migrating off the drum. Appears to be healed underneath, but I am not 076% certain. Questionable patulous eustachian tube history.     Plan:  Okay to get water in the right ear. Follow up in 6 months for check-up. At this point, I will remove the patch if still present, hoping it will slough off in the meantime.  Call if any drainage or worsening symptoms in the meantime.     [name], MD   11/09/2021, 14:56    PCP:  Elgie Collard, MD  [facility] [address]  [place] [address]   REF:  No referring provider defined for this encounter.

## 2021-11-28 ENCOUNTER — Encounter (INDEPENDENT_AMBULATORY_CARE_PROVIDER_SITE_OTHER): Payer: Self-pay | Admitting: Urology

## 2021-11-28 ENCOUNTER — Other Ambulatory Visit: Payer: Self-pay

## 2021-11-28 ENCOUNTER — Ambulatory Visit: Payer: Medicare Other | Attending: Urology | Admitting: Urology

## 2021-11-28 VITALS — BP 132/57 | HR 64 | Temp 97.5°F | Ht 63.0 in | Wt 164.9 lb

## 2021-11-28 DIAGNOSIS — N3010 Interstitial cystitis (chronic) without hematuria: Secondary | ICD-10-CM | POA: Insufficient documentation

## 2021-11-28 NOTE — Progress Notes (Signed)
S:  Returns for follow up,  History of IC, PBS, and kidney stones.  States she had a kidney stone about a month ago that she believes passed. No records on file for stone / interventions at our facility. Additionally states she had one episode of back pain a few nights ago but has not had any further symptoms. Her bladder spasms / pain are well controlled with Elmiron and Klonopin. Denies dysuria or hematuria. Denies fever or chills. She does not have questions today, mainly wants to ensure she has refills.     O:  Examination:  Well-developed, well-nourished woman  in no apparent distress.  Abdomen is not distended.  CVA tenderness: none .  No suprapubic fullness.    A:  IC/PBS/Kidney stones    P:  I had a long discussion with Flo Berroa regarding their kidney stones.  I explained to them that kidney stones can recur.  The recurrence rate is approximately 20%.  Therefore, stone prevention is key.  Ayen Viviano is encouraged to drink approximately 6 to 8 glasses of water per day.  In addition, drinks such as lemonade and orange juice are high in citric acid which is a natural inhibitor of stone formation.    I explained that in some patients we perform additional blood testing and urine studies for stone formation.  The most common abnormalities seen on these tests are low urine volume and low levels of citric acid.  Therefore, the patient was again advised to follow the abovementioned advice regarding drinking water and lemonade/orange juice.    Tansy Lorek was advised to remember the signs of kidney stone passage which include unrelenting flank/lower quadrant pain.  If this pain does not respond to over the counter pain medication, Elyse Prevo was advised to go to the nearest emergency department for evaluation.  They were advised that if any x-rays are performed to ask for the CD disc upon their release and bring these films to [name] for review.  This can reduce additional unnecessary x-rays being performed.    Sulay Brymer was appraised that in the acute passage of a stone that is blocked in the ureter in the setting of infection and impaired kidney function or chronic pain that we try to place a stent to drain the kidney.  We go back to get the stone at a later time.    Abbygayle Helfand was appraised that small stones such as those less than 5 mm have a greater than 50% chance of passing on their own.  They were told that it can take up to 40 days to pass such a stone and to expect that a conservative approach may need over a month to see if the stone passes.      Finally, Davionne Mastrangelo was advised that treatment of kidney stones involves multiple modalities including shock wave lithotripsy, going up the ureter with a telescope and laser to break the stone (ureteroscopy), going through the back to break the stone (percutaneous surgery) and rarely open surgery.  The chosen surgical procedure depends on the size of the stone and its location in the kidney or ureter.      The patient was told of three important components they can undertake to improve their voiding:  They are:  (1)  Scheduled voiding, (2) Limiting liquids prior to bedtime, and (3) Avoiding bladder irritants.  I discussed each component with the patient in detail as written below:     First, Ellene Route was instructed  to assist their voiding by voiding on a schedule of approximately every 2-3 hours even if they do not have to void.  In addition, they are instructed to double-void.  This means they should void in the bathroom and then attempt to void again a few minutes later, this may allow for elimination of some more left over urine.  This maneuver can improve bladder function.    Second,  Ellene Route was instructed to limit liquids approximately 2 hours prior to bedtime.  This is because the increased fluid intake can lead to increased urine output overnight.  I advised the patient that is normal to void up to 2 times per night.      Finally, Phelicia Dantes was told about  reduction of bladder irritants.  This may include maneuvers such as limiting the the intake of acid foods, spicy foods and caffeinated beverages.  In some patients, these foods/beverages can cause bladder irritation and symptoms of urgency, frequency, pelvic and urethral pain.  If such foods cause problems, they should be avoided.    Return in 6-12 months or sooner if issues arise.    Barbera Setters, MED STUDENT    Please see my note under separate cover.    Woodfin Ganja, MD, MBA, FACS  Professor and [facility]  Department of Urology    Certified in Pain Management  [employer] Experts in Traumatic Stress

## 2021-11-28 NOTE — Progress Notes (Signed)
S:  Returns for follow up, IC/PBS, on Elmiron.    I explained to Connie Pineda about the risks of Elmiron.  This medication is used in the management of interstitial cystitis/painful bladder syndrome.  This medication has been FDA approved to treat this bladder/pain/frequency/urgency syndrome since the late 1990s.  However, it has been noted that this medication can be associated with retinal pigment changes.  Pigment changes have been reported in the literature as pigmentary maculopathy following long term use of Elmiron.  This may include difficulty with reading, slow adjustment to light and blurred vision.  The visual consequences of these symptoms are not known.  I explained that Connie Pineda should see their ophthalmologist  if they have any visual symptoms.  This may include a baseline eye examination and periodic follow up examinations.      Cr is 0.90.    Renal sono 05/2021  "1.Moderate cortical atrophy and lobular contour of the right kidney.  2.No evidence of nephrolithiasis or hydronephrosis. Previous noted 4 mm renal calculus within the left lower pole is not identified on this study."    O:  Examination:  Well-developed, well-nourished woman  in no apparent distress.  Abdomen is not distended.  CVA tenderness: none .  No suprapubic fullness.    A:  IC/PBS.    Continue current care.    Return in 1 year.    Woodfin Ganja, MD, MBA, FACS  Professor and [employer] [employer]  Department of Urology    Certified in Pain Management  American Academy of Experts in Traumatic Stress

## 2021-11-28 NOTE — Addendum Note (Signed)
Addended by: Sunday Spillers on: 11/28/2021 02:39 PM     Modules accepted: Orders

## 2021-12-19 ENCOUNTER — Ambulatory Visit (HOSPITAL_COMMUNITY): Payer: Self-pay | Admitting: VASCULAR SURGERY

## 2021-12-19 ENCOUNTER — Other Ambulatory Visit (HOSPITAL_COMMUNITY): Payer: Self-pay

## 2022-02-14 ENCOUNTER — Ambulatory Visit (HOSPITAL_BASED_OUTPATIENT_CLINIC_OR_DEPARTMENT_OTHER): Payer: Self-pay | Admitting: Internal Medicine

## 2022-02-14 ENCOUNTER — Ambulatory Visit (HOSPITAL_BASED_OUTPATIENT_CLINIC_OR_DEPARTMENT_OTHER): Payer: Self-pay

## 2022-02-20 ENCOUNTER — Ambulatory Visit (HOSPITAL_BASED_OUTPATIENT_CLINIC_OR_DEPARTMENT_OTHER): Payer: Self-pay | Admitting: Internal Medicine

## 2022-02-20 ENCOUNTER — Other Ambulatory Visit (INDEPENDENT_AMBULATORY_CARE_PROVIDER_SITE_OTHER): Payer: Self-pay | Admitting: Specialist

## 2022-02-20 ENCOUNTER — Ambulatory Visit (HOSPITAL_BASED_OUTPATIENT_CLINIC_OR_DEPARTMENT_OTHER): Payer: Self-pay

## 2022-02-27 ENCOUNTER — Other Ambulatory Visit (HOSPITAL_COMMUNITY): Payer: Self-pay | Admitting: VASCULAR SURGERY

## 2022-02-27 DIAGNOSIS — I6523 Occlusion and stenosis of bilateral carotid arteries: Secondary | ICD-10-CM

## 2022-03-13 ENCOUNTER — Ambulatory Visit (HOSPITAL_BASED_OUTPATIENT_CLINIC_OR_DEPARTMENT_OTHER): Payer: Self-pay

## 2022-03-13 ENCOUNTER — Ambulatory Visit (HOSPITAL_BASED_OUTPATIENT_CLINIC_OR_DEPARTMENT_OTHER): Payer: Self-pay | Admitting: Internal Medicine

## 2022-03-17 NOTE — Progress Notes (Signed)
Patient Identifier:Yarixa Kanode  ZH:[id number] for a reported TIA    HPI:  32 yr. Old rt handed female referred by her PCP for a reported TIA but does have a reported hx of stroke in 2021 and carotid stenosis. She actually saw our vascular surgery service on 1.9.23 for the reported carotid stenosis and in his note he made note of CT soft tissue neck with IV contrast 07/2020 with < 50% bilateral carotid stenosis and Carotid duplex surveillance in 6 months and if stable <50%, will need further FU with PCP. 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 According to his referral note, she was on aspirin and atorvastatin.   She says two months ago she was at her senior living home and she was in the lunch line and all of a sudden she started sayings that did not make any sense and she went to [facility] and she is not sure what they did. She says she got her speech back to normal within 10 minutes. She does not recall any focal numbness or weakness. She has not had any further episodes since that and she has a physical therapist. She has not had any bad falls but does stumble easily. She is taking a baby aspirin once a day now but was not before and is taking a statin medication now. She has chronic dizziness.     Interval Hx 10.10.23:  I last saw patient 3.8.23. she says she had concerns because she had an episode where she was sitting and she dozed off and the nurse had to shake her to wake up. She still reports some difficulty with her memory and says that sometimes when she watches TV she will see two images side by side and this has been present for two to three weeks. She has not see the eye doctor recently. Her biggest concern is difficulty remembering what she  put away and where she put a glass of water.     PMH:  1.hx of patulous eustachian tube and has had bilateral T tubes and hx of perforation the left tympanic membrane and seen ENT here. Hx of hearing loss  2.mastectomy.   3.interstitinal cystitis  4.reported hx of stroke-she says she did not have a stroke before this.   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   10.HTN  11.HLD  [employer] rt sided carotid artery disease  13.GERD  14.osteopenia  15.nephrolithiasis  16.ENT procedure done 4.4.23 paper patch myringoplasty.   -she has never had a MI and has never been diagnosed with atrial fibrillation and flutter.     Medications:  Outpatient Medications Marked as Taking for the 03/21/22 encounter (Office Visit) with Vista Lawman, MD   Medication Sig    amLODIPine (NORVASC) 2.5 mg Oral Tablet Take 1 Tablet (2.5 mg total) by mouth    aspirin 81 mg Oral Capsule Take by mouth    atorvastatin (LIPITOR) 20 mg Oral Tablet Take 2 Tablets (40 mg total) by mouth    bismuth subsalicylate (PEPTO-BISMOL ORAL) Take by mouth    clonazePAM (KLONOPIN) 1  mg Oral Tablet Take 1 Tablet (1 mg total) by mouth Twice daily    ELMIRON 100 mg Oral Capsule TAKE ONE CAPSULE BY MOUTH 3 TIMES A DAY BEFORE MEALS    esomeprazole magnesium (NEXIUM) 40 mg Oral Capsule, Delayed Release(E.C.) Take 1 Capsule (40 mg total) by mouth Every morning before breakfast    HERBAL DRUGS ORAL Take by mouth Once a day Quercetin    sucralfate (CARAFATE) 1 gram Oral Tablet Take 1 Tablet (1 g total) by mouth Three times daily before meals    triamterene-hydroCHLOROthiazide (DYAZIDE) 37.5-25 mg Oral Capsule Take 1 Capsule by mouth Every morning    zinc Sulfate (ZINCATE) 50 mg zinc (220 mg) Oral Tablet Take 1 Tablet (50 mg total) by mouth Once a day         Allergies:  No Known Allergies    SH:  -she lives at [facility] at [employer] and lives  in her own apartment there.   -she no longer drives because she gave up her car.   -she has never been a smoker or consumed alcohol.     FH:  -father had strokes and MI's.     Pertinent ROS:  General:  Skin:no sig bruising on the aspirin.   HEENT:no sig headaches. She does not recall any acute vision issues but says that she has occasional diplopia if she looks at something.   CV:no known diagnosis of atrial fibrillation.   Resp:  GI:no bloody stools.   GU/Reproductive:  Musculoskeletal:  Endocrine:denies a hx of diabetes.   Hematology:she denies any severe bleeding on the aspirin.     Physical Exam:  Vitals:P: 61 BP: 113/74 WT: 73.9kg    General:elderly female in no sig distress.   HEENT:normocephalic.   CV:RRR wo murmur. No carotid bruits bilaterally.   Resp:good air movement bilaterally.     Neurological examination:  Mentation:awake, alert, and orientedx3 specifically yr, month and day of the week. Speech fluent.   MMSE 10.10.23:29/30  -missed the date by one but I did not take off for this.   -missed one object out of three in 5 minutes (1pt)  Cranial:visual fields grossly intact. PERRL. EOMI w.o appreciable nystagmus  Facial movements including brow raising and smile symmetrical. Hearing grossly intact.   Fundoscopic: no disc edema bilaterally.  Motor:normal tone throughout all extremities. No appreciable drift in upper exts. 5/5 strength in upper extremities bilaterally. 5/5 strength in lower extremities bilaterally.   Reflexes:2+patellars bilaterally (brisk). 2+biceps brachii bilaterally(brisk). Hoffman not present bilaterally.   Sensory:  Coordination/Cerebllar Testing:FTN smooth bilaterally.   Gait:Rhomberg not present. Normal and Narrow based gait    Labs/Diagnostics/Imaging:  Labs from 3.8.23:  TSH 1.826  Folate 7.6  B12 407  CTA head/neck dated 6.9.22:done at [facility]  -reports plaquing in the left carotid of origin and bifurcation of the left ICA of 50-60% and reports this in the rt ICA as  well  -CTA head is reported as negative    CT soft tissue neck with contrast 2.28.22:  Atherosclerotic disease, with likely moderate narrowing at the right ICA origin.     Multilevel cervical spondylosis. Bony demineralization.     IMPRESSION:  No suspicious lymph nodes by CT criteria within the neck.     Carotid doppler dated 1.19.23:  Conclusions: No significant bilateral carotid stenosis. Bilateral vertebral arteries patent and antegrade.      Impression/Plan:  42 yr. Old female referred to me today for a reported hx of stroke with concern for a  TIA in July of 2022 who after neurological evaluation with that being a possibility versus a metabolic cause:    1.reported TIA but still not clear to me since I do not have all the records:  Work up: MRI report dated 6.10.122 did not report any acute infarct and her heart monitor report dated July of 2022 does not report atrial fibrillation. There is a follow up carotid doppler ordered by vascular surgery.   Tx: she is currently on aspirin and as statin and I did not change this.   Education: Please note that I specifically went over signs of a stroke including but not limited to facial tingling or dropping, sudden onset of arm or leg weakness, acute visual loss, speech difficulties such as not being able to speak or slurred speech. I specifically told patient if they were to ever experience this suddenly they should call 911 immediately. I also told her if she ever fell and hit her head she needs to go to Ed immediately.     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     3.reported occasional diplopia:  Work up/tx: Garment/textile technologist testing was normal today by my bedside exam and I did not see any gross abnormality on her optic disc but I did recommend she set up a follow up with her eye doctor and she voiced understanding.     Fx up in 12 months.        Total time by staff: __35__ minutes. Greater than 50% of that time was spent on counseling/coordination of care regarding: _____as above_____               Thank you.     n

## 2022-03-21 ENCOUNTER — Ambulatory Visit (HOSPITAL_BASED_OUTPATIENT_CLINIC_OR_DEPARTMENT_OTHER): Payer: Medicare Other | Admitting: Internal Medicine

## 2022-03-21 ENCOUNTER — Encounter (HOSPITAL_BASED_OUTPATIENT_CLINIC_OR_DEPARTMENT_OTHER): Payer: Self-pay | Admitting: Internal Medicine

## 2022-03-21 ENCOUNTER — Ambulatory Visit: Payer: Medicare Other | Attending: Internal Medicine | Admitting: Internal Medicine

## 2022-03-21 ENCOUNTER — Encounter (INDEPENDENT_AMBULATORY_CARE_PROVIDER_SITE_OTHER): Payer: Self-pay | Admitting: Internal Medicine

## 2022-03-21 ENCOUNTER — Other Ambulatory Visit: Payer: Self-pay

## 2022-03-21 ENCOUNTER — Inpatient Hospital Stay (HOSPITAL_BASED_OUTPATIENT_CLINIC_OR_DEPARTMENT_OTHER)
Admission: RE | Admit: 2022-03-21 | Discharge: 2022-03-21 | Disposition: A | Discharge status: Home / Self Care | Payer: Medicare Other | Source: Ambulatory Visit | Attending: Internal Medicine | Admitting: Internal Medicine

## 2022-03-21 VITALS — BP 113/74 | HR 61 | Temp 97.2°F | Ht 63.0 in | Wt 162.9 lb

## 2022-03-21 DIAGNOSIS — C8590 Non-Hodgkin lymphoma, unspecified, unspecified site: Secondary | ICD-10-CM

## 2022-03-21 DIAGNOSIS — H532 Diplopia: Secondary | ICD-10-CM | POA: Insufficient documentation

## 2022-03-21 DIAGNOSIS — Z8572 Personal history of non-Hodgkin lymphomas: Secondary | ICD-10-CM

## 2022-03-21 DIAGNOSIS — R413 Other amnesia: Secondary | ICD-10-CM | POA: Insufficient documentation

## 2022-03-21 DIAGNOSIS — Z7982 Long term (current) use of aspirin: Secondary | ICD-10-CM | POA: Insufficient documentation

## 2022-03-21 DIAGNOSIS — I10 Essential (primary) hypertension: Secondary | ICD-10-CM

## 2022-03-21 DIAGNOSIS — Z8673 Personal history of transient ischemic attack (TIA), and cerebral infarction without residual deficits: Secondary | ICD-10-CM | POA: Insufficient documentation

## 2022-03-21 DIAGNOSIS — E7849 Other hyperlipidemia: Secondary | ICD-10-CM

## 2022-03-21 LAB — CBC WITH DIFF
BASOPHIL #: <0.1 10*3/uL (ref <=0.20)
BASOPHIL %: 1 %
EOSINOPHIL #: 0.15 10*3/uL (ref <=0.50)
EOSINOPHIL %: 2 %
HCT: 45.2 % (ref 34.8–46.0)
HGB: 15.5 g/dL (ref 11.5–16.0)
IMMATURE GRANULOCYTE #: <0.1 10*3/uL (ref <0.10)
IMMATURE GRANULOCYTE %: 0 % (ref 0–1)
LYMPHOCYTE #: 1.98 10*3/uL (ref 1.00–4.80)
LYMPHOCYTE %: 30 %
MCH: 28.1 pg (ref 26.0–32.0)
MCHC: 34.3 g/dL (ref 31.0–35.5)
MCV: 82 fL (ref 78.0–100.0)
MONOCYTE #: 0.46 10*3/uL (ref 0.20–1.10)
MONOCYTE %: 7 %
MPV: 10.4 fL (ref 8.7–12.5)
NEUTROPHIL #: 3.96 10*3/uL (ref 1.50–7.70)
NEUTROPHIL %: 60 %
PLATELETS: 188 10*3/uL (ref 150–400)
RBC: 5.51 10*6/uL — ABNORMAL HIGH (ref 3.85–5.22)
RDW-CV: 13.2 % (ref 11.5–15.5)
WBC: 6.6 10*3/uL (ref 3.7–11.0)

## 2022-03-21 LAB — COMPREHENSIVE METABOLIC PANEL, NON-FASTING
ALBUMIN: 4.3 g/dL (ref 3.4–4.8)
ALKALINE PHOSPHATASE: 111 U/L (ref 55–145)
ALT (SGPT): 21 U/L (ref 8–22)
ANION GAP: 7 mmol/L (ref 4–13)
AST (SGOT): 20 U/L (ref 8–45)
BILIRUBIN TOTAL: 2.3 mg/dL — ABNORMAL HIGH (ref 0.3–1.3)
BUN/CREA RATIO: 16 (ref 6–22)
BUN: 15 mg/dL (ref 8–25)
CALCIUM: 11 mg/dL — ABNORMAL HIGH (ref 8.6–10.3)
CHLORIDE: 104 mmol/L (ref 96–111)
CO2 TOTAL: 28 mmol/L (ref 23–31)
CREATININE: 0.94 mg/dL (ref 0.60–1.05)
ESTIMATED GFR - FEMALE: 61 mL/min/BSA (ref >=60)
GLUCOSE: 102 mg/dL (ref 65–125)
POTASSIUM: 3.5 mmol/L (ref 3.5–5.1)
PROTEIN TOTAL: 7.6 g/dL (ref 6.0–8.0)
SODIUM: 139 mmol/L (ref 136–145)

## 2022-03-21 LAB — LDH: LDH: 185 U/L (ref 125–220)

## 2022-03-21 NOTE — Patient Instructions (Signed)
Please set up a follow up appt with your eye doctor as soon as possible.

## 2022-03-22 ENCOUNTER — Encounter (INDEPENDENT_AMBULATORY_CARE_PROVIDER_SITE_OTHER): Payer: Self-pay

## 2022-03-22 ENCOUNTER — Encounter (HOSPITAL_BASED_OUTPATIENT_CLINIC_OR_DEPARTMENT_OTHER): Payer: Self-pay | Admitting: Internal Medicine

## 2022-03-22 NOTE — Cancer Center Note (Signed)
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     - 03/08/18:  CT neck revealed abnormal enlargement of nasopharyngeal soft tissues and pathological lymphadenopathy in the neck.   - 03/22/18: right cervical lymph node and nasopharyngeal biopsy confirmed follicular lymphoma, grade 3A.  - 04/17/18:  PET-CT revealed notable bilateral adenopathy in the neck and posterior nasopharynx, hypermetabolic consistent with malignancy.  No pathologically enlarged or significantly hypermetabolic adenopathy in the chest, abdomen or pelvis.  No splenomegaly.   - 11/19-2/21/20:  Status post cycle 1 of Rituxan alone, then 3 cycles of combined bendamustine and Rituximab completed by 08/02/18.  Bendamustine dose reduced to 70 milligrams/meter squared for interstitial cystitis.  - 08/22/18:  PET-CT revealed interval resolution of bilateral hypermetabolic cervical and posterior nasopharyngeal adenopathy.  No evidence for residual or recurrent metabolically active tumor.  - 08/2018:  Maintenance treatment with rituximab was discussed by the primary oncologist, however given her concerns with interstitial cystitis and  memory issues, shared decision was made to hold off on maintenance treatment and pursue watchful observation.  - 10/30/18:  CT neck revealed interval resolution of nasopharyngeal mass and cervical adenopathy bilaterally.  No residual mass or adenopathy.  - 06/03/19:  PET CT showed no evidence of recurrence of lymphoma.    Interim History:  Patient denies fevers, unintentional weight loss, night sweats, enlarged  lymph nodes, abdominal pain, n/v/d. She is still have issues with her ears, noted to have patulous eustachian tube and this remains her primary concern/complaints. She is currently residing in a senior/assisted living. She was seen by neurology today as well, referred for neuropsych testing for memory concerns.     REVIEW OF SYSTEMS:   All systems reviewed and are negative except for what is mentioned in HPI    Past Medical History:   Diagnosis Date    Bruises easily     Cancer (CMS [place])     Esophageal reflux     controlled with medication    Essential hypertension     Hearing loss     Heart disease     History of kidney disease     dcotors are keeping an eye on her kidney function per patient    Hyperlipidemia     Kidney stones     TIA (transient ischemic attack)     Summer 2022, affected her memory     Past Surgical History:   Procedure Laterality Date    CATARACT EXTRACTION, BILATERAL Bilateral     COLONOSCOPY      HX OTHER      surgery on muscle right neck    HX TONSILLECTOMY      HX VENTILATORY TUBES Bilateral     KIDNEY SURGERY      for kidney infection    MASTECTOMY, PARTIAL Left      Social History     Socioeconomic History    Marital status: Widowed     Spouse name: Not on file    Number of children: Not on file    Years of  education: Not on file    Highest education level: Not on file   Occupational History    Not on file   Tobacco Use    Smoking status: Never    Smokeless tobacco: Never   Vaping Use    Vaping Use: Never used   Substance and Sexual Activity    Alcohol use: Never    Drug use: Never    Sexual activity: Not on file   Other Topics Concern    Ability to Walk 1 Flight of Steps without SOB/CP Yes    Routine Exercise Not Asked    Ability to Walk 2 Flight of Steps without SOB/CP Yes    Unable to Ambulate Not Asked    Total Care Not Asked    Ability To Do Own ADL's Yes    Uses Walker No    Other Activity Level Not Asked    Uses Cane No   Social History Narrative    Not on file     Social  Determinants of Health     Financial Resource Strain: Not on file   Transportation Needs: Not on file   Social Connections: Not on file   Intimate Partner Violence: Not on file   Housing Stability: Not on file     Family Medical History:       Problem Relation (Age of Onset)    Heart Disease Father    Thyroid Cancer Mother          PHYSICAL EXAM:  Most Recent [facility] Office Visit from 03/21/2022 in Neurology, Physician Office   Center   Temperature 36.2 C (97.2 F) filed at... 03/21/2022 1104   Heart Rate 61 filed at... 03/21/2022 1104   Respiratory Rate --   BP (Non-Invasive) 113/74 filed at... 03/21/2022 1104   SpO2 98 % filed at... 03/21/2022 1104   Height 1.6 m ('5\' 3"'$ ) filed at... 03/21/2022 1104   Weight 73.9 kg (162 lb 14.7 oz) filed at... 03/21/2022 1104   BMI (Calculated) 28.92 filed at... 03/21/2022 1104   BSA (Calculated) 1.81 filed at... 03/21/2022 1104   ECOG: (1) Restricted in physically strenuous activity, ambulatory and able to do work of light nature  General: well appearing elderly female in no distress. Hard of hearing  Eyes: EOMI, anicteric sclera  HENT: Head NCAT. mucous membranes moist.  Neck: supple  Lungs: clear to auscultation bilaterally.   Cardiovascular: RRR, no m/r/g  Abdomen: soft, non-tender, no HSM  Extremities: no edema  Skin: Skin warm and dry  Lymphatics: no palpable lymphadenopathy  Neurologic: AAOx3, no focal deficits, at baseline.   Psychiatric: affect normal.     LABS  COMPLETE BLOOD COUNT   Lab Results   Component Value Date    WBC 6.6 03/21/2022    HGB 15.5 03/21/2022    HCT 45.2 03/21/2022    PLTCNT 188 03/21/2022     DIFFERENTIAL  Lab Results   Component Value Date    PMNS 60 03/21/2022    MONOCYTES 7 03/21/2022    BASOPHILS 1 03/21/2022    BASOPHILS <0.10 03/21/2022    PMNABS 3.96 03/21/2022    LYMPHSABS 1.98 03/21/2022    EOSABS 0.15 03/21/2022    MONOSABS 0.46 03/21/2022     COMPREHENSIVE METABOLIC PANEL - NON FASTING  Lab Results   Component Value Date     SODIUM 139 03/21/2022    POTASSIUM 3.5 03/21/2022    CHLORIDE 104 03/21/2022    CO2 28 03/21/2022  ANIONGAP 7 03/21/2022    BUN 15 03/21/2022    CREATININE 0.94 03/21/2022    GLUCOSENF 102 03/21/2022    CALCIUM 11.0 (H) 03/21/2022    ALBUMIN 4.3 03/21/2022    TOTALPROTEIN 7.6 03/21/2022    ALKPHOS 111 03/21/2022    AST 20 03/21/2022    ALT 21 03/21/2022       IMAGING:  CT neck 08/09/20 reviewed by me, no abnormal lymph nodes noted  IMPRESSION:  No suspicious lymph nodes by CT criteria within the neck.       PATHOLOGY:  D.  LYMPH NODE, RIGHT NECK, BIOPSY (OSS, [id number], 14 slides, [facility]; obtained 01/60/1093):    - Follicular lymphoma, grade 3a, follicular pattern.  (See comment)     E.  NASOPHARYNX, BIOPSY (OSS, [id number], 14 slides, [facility]; obtained 23/55/7322):    - Follicular lymphoma, grade 3a, follicular pattern.      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     1. Follicular lymphoma (grade 3a)  - Diagnosed 03/2018 on right neck lymph node and nasopharyngeal biopsies  - s/p 4 cycles of Rituximab, 3 cycles including dose reduced dose Bendamustine.  - End of treatment imaging showed complete response.  - Maintenance treatment with rituximab was discussed by the primary oncologist, however given her concerns with interstitial cystitis and  memory issues, shared decision was made to hold off on maintenance Rituximab and pursue watchful observation.   - Last PET-CT was performed in 05/2019 which showed no evidence of recurrence  - We discussed a normal healthy blood system and the derangements that occur in follicular lymphoma.   - Discussed the excellent response she had to the treatment and plan to continue surveillance. CT neck without evidence of disease.   -  Reassured the patient that her ear issues are not secondary to lymphoma.  - 03/21/2022: No active issues, no complaints or concerns today.     2.Patulous eustachian tube, history of b/l tympanostomy tube placement:  - following with ENT. Went to [facility] and the diagnosis was the same.   - No current intervention planned. Next appt pm 05/12/2022    3. Chronic interstitial cystitis:  - On [place]  - following with urology    4. Neuro:  - Patient had stroke in 2021, possible TIA in 2022. [facility] with neurology at [facility].   - 03/21/2022: Patient had appt today for possible TIA f/u as well as memory concerns. MMSE during their visit was 29/30, felt that memory loss was age-related. However, she was referred to have neuropsych testing per neurology.       Disposition:  RTC 6 months with HM1.    I independently of the faculty provider spent a total of 18 minutes in direct/indirect care of this patient including initial evaluation, review of laboratory, radiology, diagnostic studies, review of medical record, order entry and coordination of care.    Patient seen with co-signing faculty, Dr. Harrington Challenger, in clinic.     Meta Hatchet, APRN   Section of Hematology/Oncology   [facility]      I personally saw and evaluated the patient as part of a shared service with an APP.    My substantive findings are:  MDM (complete) FL on active surveillance, no evidence relapse.  I independently of the APP spent a total of (19) minutes in direct/indirect care of this patient including initial evaluation, review of laboratory, radiology, diagnostic studies, review of medical record, order entry and coordination of care.     Katheren Puller, MD

## 2022-03-22 NOTE — Progress Notes (Signed)
Patient sent MyChart message to contact the Neuropsychology Clinic to schedule their referred evaluation.     [facility]  [facility]   [address]  [place], [place]  [address]  Tel: [phone]

## 2022-05-12 ENCOUNTER — Encounter (INDEPENDENT_AMBULATORY_CARE_PROVIDER_SITE_OTHER): Payer: Self-pay | Admitting: Otolaryngology

## 2022-05-12 ENCOUNTER — Other Ambulatory Visit: Payer: Self-pay

## 2022-05-12 ENCOUNTER — Ambulatory Visit (INDEPENDENT_AMBULATORY_CARE_PROVIDER_SITE_OTHER): Payer: Medicare Other | Admitting: Otolaryngology

## 2022-05-12 VITALS — BP 124/62 | HR 62 | Temp 97.8°F | Ht 63.0 in | Wt 164.5 lb

## 2022-05-12 DIAGNOSIS — H903 Sensorineural hearing loss, bilateral: Secondary | ICD-10-CM

## 2022-05-12 DIAGNOSIS — H6901 Patulous Eustachian tube, right ear: Secondary | ICD-10-CM

## 2022-05-12 DIAGNOSIS — H6900 Patulous Eustachian tube, unspecified ear: Secondary | ICD-10-CM

## 2022-05-12 NOTE — Progress Notes (Signed)
PATIENT NAME:  Connie Pineda  MRN:  [id number]  DOB:  1940-12-12  DATE OF SERVICE: 05/12/2022    HPI:  Connie Pineda is an 81 y.o. female with history of eustachian tube dysfunction versus patulous eustachian tube. He had right tube removed and a paper fashion myringoplasty done earlier this year. She has done well since without otorrhea. She does have chronic symptoms of echoing sounds in her ear, intermittent ear fullness, and headaches. She has no change in hearing or dizziness.     Physical Exam:  Blood pressure 124/62, pulse 62, temperature 36.6 C (97.8 F), temperature source Thermal Scan, height 1.6 m ('5\' 3"'$ ), weight 74.6 kg (164 lb 7.4 oz), SpO2 95%.  Body mass index is 29.13 kg/m.  General: Well-appearing female in no acute distress.  Head: Normocephalic, atraumatic.    Ears: Binocular microscopy used to visualize both ears: Bilateral canals patent. Drums intact, mid-position bilaterally. Right one extrudes in and out with inhalation; the left one does not. No evidence of otitis media or middle ear effusion. Good mobility of the drum bilaterally.    Neurologic: Cranial nerves: II through XII grossly intact.     Procedure:  ENT, [facility]  [place] [id number]  Operated by [place]  Procedure Note    Name: Teegan Guinther MRN:  [id number]   Date: 05/12/2022 Age: 81 y.o.  DOB:   Jun 04, 1941       Binocular Microscopy    Performed by: [name], MD  Authorized by: [name], MD    Consent:     Consent obtained:  Verbal    Consent given by:  Patient    Risks discussed:  Bleeding    Alternatives discussed:  No treatment  Post-procedure details:     Patient tolerance of procedure:  Tolerated well, no immediate complications  Comments:      Binocular microscopy necessary on exam today for diagnostic and/or treatment purposes; see dictated note.  [name], MD 05/12/2022, 16:38        [name], MD    Data Reviewed:   N/A    Assessment:  Assessment/Plan   1. Patulous Eustachian tube, unspecified laterality    2. Sensorineural hearing loss (SNHL) of both ears      Assessment:    Patulous eustachian tube dysfunction, right side. Tube removed and patched. No evidence of perforation noted.      Plan:  Orders Placed This Encounter   . AUDIOLOGY EVALUATION(REF AUDIOLOGY)-[facility]   . Binocular Microscopy     Plan:   Follow up in one year with repeat audiogram. Discussed no evidence of infection or any worsening signs. Something  is trying to learn to cope and learn to live with these symptoms. We will see her back in one year.     [name], MD        05/15/2022, 07:55    PCP:  Elgie Collard, MD  [address]  [place] [address]   REF:  No referring provider defined for this encounter.

## 2022-05-12 NOTE — Procedures (Signed)
ENT, [facility]  [place] [id number]  Operated by [place]  Procedure Note    Name: Connie Pineda MRN:  [id number]   Date: 05/12/2022 Age: 81 y.o.  DOB:   [birth year]       Binocular Microscopy    Performed by: [name], MD  Authorized by: [name], MD    Consent:     Consent obtained:  Verbal    Consent given by:  Patient    Risks discussed:  Bleeding    Alternatives discussed:  No treatment  Post-procedure details:     Patient tolerance of procedure:  Tolerated well, no immediate complications  Comments:      Binocular microscopy necessary on exam today for diagnostic and/or treatment purposes; see dictated note.  [name], MD 05/12/2022, 16:38        [name], MD

## 2022-09-19 ENCOUNTER — Ambulatory Visit (HOSPITAL_BASED_OUTPATIENT_CLINIC_OR_DEPARTMENT_OTHER): Payer: Self-pay

## 2022-09-19 ENCOUNTER — Ambulatory Visit (HOSPITAL_BASED_OUTPATIENT_CLINIC_OR_DEPARTMENT_OTHER): Payer: Self-pay | Admitting: Internal Medicine

## 2022-11-01 ENCOUNTER — Encounter (INDEPENDENT_AMBULATORY_CARE_PROVIDER_SITE_OTHER): Payer: Self-pay | Admitting: Clinical Neuropsychologist

## 2022-12-01 ENCOUNTER — Encounter (INDEPENDENT_AMBULATORY_CARE_PROVIDER_SITE_OTHER): Payer: Self-pay | Admitting: Otolaryngology

## 2022-12-18 ENCOUNTER — Ambulatory Visit (INDEPENDENT_AMBULATORY_CARE_PROVIDER_SITE_OTHER): Payer: Self-pay | Admitting: Urology

## 2023-03-19 ENCOUNTER — Other Ambulatory Visit (INDEPENDENT_AMBULATORY_CARE_PROVIDER_SITE_OTHER): Payer: Self-pay | Admitting: Urology

## 2023-10-30 ENCOUNTER — Encounter (INDEPENDENT_AMBULATORY_CARE_PROVIDER_SITE_OTHER): Payer: Self-pay

## 2023-11-14 ENCOUNTER — Other Ambulatory Visit (INDEPENDENT_AMBULATORY_CARE_PROVIDER_SITE_OTHER): Payer: Self-pay | Admitting: Specialist
